# Patient Record
Sex: Female | Born: 1995
Health system: Southern US, Community
[De-identification: ages and names within clinical notes are randomized; demographics above are authoritative.]

## PROBLEM LIST (undated history)

## (undated) DIAGNOSIS — Z87898 Personal history of other specified conditions: Secondary | ICD-10-CM

## (undated) DIAGNOSIS — N939 Abnormal uterine and vaginal bleeding, unspecified: Secondary | ICD-10-CM

## (undated) DIAGNOSIS — T7840XA Allergy, unspecified, initial encounter: Secondary | ICD-10-CM

## (undated) DIAGNOSIS — R1313 Dysphagia, pharyngeal phase: Secondary | ICD-10-CM

## (undated) DIAGNOSIS — R790 Abnormal level of blood mineral: Secondary | ICD-10-CM

## (undated) DIAGNOSIS — R32 Unspecified urinary incontinence: Secondary | ICD-10-CM

## (undated) DIAGNOSIS — J45909 Unspecified asthma, uncomplicated: Secondary | ICD-10-CM

## (undated) DIAGNOSIS — I499 Cardiac arrhythmia, unspecified: Secondary | ICD-10-CM

## (undated) DIAGNOSIS — F909 Attention-deficit hyperactivity disorder, unspecified type: Secondary | ICD-10-CM

## (undated) DIAGNOSIS — M542 Cervicalgia: Secondary | ICD-10-CM

## (undated) DIAGNOSIS — M6289 Other specified disorders of muscle: Secondary | ICD-10-CM

## (undated) DIAGNOSIS — E559 Vitamin D deficiency, unspecified: Secondary | ICD-10-CM

## (undated) DIAGNOSIS — S73191A Other sprain of right hip, initial encounter: Secondary | ICD-10-CM

## (undated) DIAGNOSIS — J343 Hypertrophy of nasal turbinates: Secondary | ICD-10-CM

## (undated) DIAGNOSIS — K219 Gastro-esophageal reflux disease without esophagitis: Secondary | ICD-10-CM

## (undated) DIAGNOSIS — N809 Endometriosis, unspecified: Secondary | ICD-10-CM

## (undated) DIAGNOSIS — Q6589 Other specified congenital deformities of hip: Secondary | ICD-10-CM

## (undated) DIAGNOSIS — E7800 Pure hypercholesterolemia, unspecified: Secondary | ICD-10-CM

## (undated) DIAGNOSIS — G8929 Other chronic pain: Secondary | ICD-10-CM

## (undated) DIAGNOSIS — M25552 Pain in left hip: Secondary | ICD-10-CM

## (undated) DIAGNOSIS — J387 Other diseases of larynx: Secondary | ICD-10-CM

## (undated) DIAGNOSIS — M549 Dorsalgia, unspecified: Secondary | ICD-10-CM

## (undated) DIAGNOSIS — D649 Anemia, unspecified: Secondary | ICD-10-CM

## (undated) DIAGNOSIS — M791 Myalgia, unspecified site: Secondary | ICD-10-CM

## (undated) DIAGNOSIS — I7300 Raynaud's syndrome without gangrene: Secondary | ICD-10-CM

## (undated) DIAGNOSIS — S21001A Unspecified open wound of right breast, initial encounter: Secondary | ICD-10-CM

## (undated) DIAGNOSIS — M5416 Radiculopathy, lumbar region: Secondary | ICD-10-CM

## (undated) DIAGNOSIS — R09A2 Foreign body sensation, throat: Secondary | ICD-10-CM

## (undated) DIAGNOSIS — R1319 Other dysphagia: Secondary | ICD-10-CM

## (undated) DIAGNOSIS — R29898 Other symptoms and signs involving the musculoskeletal system: Secondary | ICD-10-CM

## (undated) DIAGNOSIS — J383 Other diseases of vocal cords: Secondary | ICD-10-CM

## (undated) DIAGNOSIS — R269 Unspecified abnormalities of gait and mobility: Secondary | ICD-10-CM

## (undated) DIAGNOSIS — F419 Anxiety disorder, unspecified: Secondary | ICD-10-CM

## (undated) DIAGNOSIS — N3941 Urge incontinence: Secondary | ICD-10-CM

## (undated) DIAGNOSIS — E162 Hypoglycemia, unspecified: Secondary | ICD-10-CM

## (undated) DIAGNOSIS — J342 Deviated nasal septum: Secondary | ICD-10-CM

## (undated) DIAGNOSIS — R278 Other lack of coordination: Secondary | ICD-10-CM

## (undated) DIAGNOSIS — J040 Acute laryngitis: Secondary | ICD-10-CM

## (undated) DIAGNOSIS — G2581 Restless legs syndrome: Secondary | ICD-10-CM

## (undated) DIAGNOSIS — R490 Dysphonia: Secondary | ICD-10-CM

## (undated) DIAGNOSIS — M2550 Pain in unspecified joint: Secondary | ICD-10-CM

## (undated) DIAGNOSIS — R634 Abnormal weight loss: Secondary | ICD-10-CM

## (undated) HISTORY — DX: Deviated nasal septum: J34.2

## (undated) HISTORY — DX: Restless legs syndrome: G25.81

## (undated) HISTORY — DX: Attention-deficit hyperactivity disorder, unspecified type: F90.9

## (undated) HISTORY — DX: Other specified disorders of muscle: M62.89

## (undated) HISTORY — DX: Urge incontinence: N39.41

## (undated) HISTORY — DX: Other lack of coordination: R27.8

## (undated) HISTORY — DX: Hypertrophy of nasal turbinates: J34.3

## (undated) HISTORY — DX: Other diseases of larynx: J38.7

## (undated) HISTORY — DX: Other specified congenital deformities of hip: Q65.89

## (undated) HISTORY — DX: Myalgia, unspecified site: M79.10

## (undated) HISTORY — PX: DENTAL SURGERY: SHX609

## (undated) HISTORY — DX: Dorsalgia, unspecified: M54.9

## (undated) HISTORY — DX: Hypoglycemia, unspecified: E16.2

## (undated) HISTORY — DX: Pain in unspecified joint: M25.50

## (undated) HISTORY — DX: Raynaud's syndrome without gangrene: I73.00

## (undated) HISTORY — DX: Unspecified abnormalities of gait and mobility: R26.9

## (undated) HISTORY — DX: Unspecified urinary incontinence: R32

## (undated) HISTORY — DX: Endometriosis, unspecified: N80.9

## (undated) HISTORY — DX: Abnormal weight loss: R63.4

## (undated) HISTORY — DX: Other sprain of right hip, initial encounter: S73.191A

## (undated) HISTORY — DX: Anemia, unspecified: D64.9

## (undated) HISTORY — DX: Dysphonia: R49.0

## (undated) HISTORY — DX: Dysphagia, pharyngeal phase: R13.13

## (undated) HISTORY — PX: HIP SURGERY: SHX245

## (undated) HISTORY — DX: Other dysphagia: R13.19

## (undated) HISTORY — DX: Gastro-esophageal reflux disease without esophagitis: K21.9

## (undated) HISTORY — DX: Cardiac arrhythmia, unspecified: I49.9

## (undated) HISTORY — DX: Acute laryngitis: J04.0

## (undated) HISTORY — DX: Abnormal level of blood mineral: R79.0

## (undated) HISTORY — DX: Allergy, unspecified, initial encounter: T78.40XA

## (undated) HISTORY — DX: Pure hypercholesterolemia, unspecified: E78.00

## (undated) HISTORY — DX: Other diseases of vocal cords: J38.3

## (undated) HISTORY — DX: Vitamin D deficiency, unspecified: E55.9

## (undated) HISTORY — DX: Unspecified open wound of right breast, initial encounter: S21.001A

## (undated) HISTORY — DX: Cervicalgia: M54.2

## (undated) HISTORY — DX: Personal history of other specified conditions: Z87.898

## (undated) HISTORY — DX: Abnormal uterine and vaginal bleeding, unspecified: N93.9

## (undated) HISTORY — DX: Foreign body sensation, throat: R09.A2

## (undated) HISTORY — DX: Anxiety disorder, unspecified: F41.9

## (undated) HISTORY — DX: Other symptoms and signs involving the musculoskeletal system: R29.898

## (undated) HISTORY — PX: LAPAROSCOPY: SHX197

## (undated) HISTORY — DX: Other chronic pain: G89.29

## (undated) HISTORY — PX: HIP ARTHROSCOPY: SUR88

## (undated) HISTORY — DX: Pain in left hip: M25.552

## (undated) HISTORY — DX: Radiculopathy, lumbar region: M54.16

---
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
## 2017-01-01 DIAGNOSIS — M25551 Pain in right hip: Secondary | ICD-10-CM | POA: Diagnosis not present

## 2017-01-01 DIAGNOSIS — S73191A Other sprain of right hip, initial encounter: Secondary | ICD-10-CM | POA: Diagnosis not present

## 2017-01-01 DIAGNOSIS — M21851 Other specified acquired deformities of right thigh: Secondary | ICD-10-CM | POA: Diagnosis not present

## 2017-01-01 DIAGNOSIS — M76891 Other specified enthesopathies of right lower limb, excluding foot: Secondary | ICD-10-CM | POA: Diagnosis not present

## 2017-01-01 DIAGNOSIS — Q6589 Other specified congenital deformities of hip: Secondary | ICD-10-CM | POA: Diagnosis not present

## 2017-02-07 MED FILL — SERTRALINE HCL 100 MG TAB: 100 | 30 days supply | Qty: 30 | Fill #0

## 2017-03-01 MED FILL — buPROPion HCL ER (XL) 150 M: 150 | 30 days supply | Qty: 30 | Fill #1

## 2017-03-14 MED FILL — SERTRALINE HCL 100 MG TAB: 100 | 30 days supply | Qty: 30 | Fill #1

## 2017-03-29 MED FILL — BUPROPION HCL XL 150 MG TAB: 150 | 30 days supply | Qty: 30 | Fill #2

## 2017-04-05 DIAGNOSIS — F329 Major depressive disorder, single episode, unspecified: Secondary | ICD-10-CM | POA: Diagnosis not present

## 2017-04-05 DIAGNOSIS — F411 Generalized anxiety disorder: Secondary | ICD-10-CM | POA: Diagnosis not present

## 2017-04-05 DIAGNOSIS — N926 Irregular menstruation, unspecified: Secondary | ICD-10-CM | POA: Diagnosis not present

## 2017-04-05 DIAGNOSIS — Z23 Encounter for immunization: Secondary | ICD-10-CM | POA: Diagnosis not present

## 2017-04-05 MED FILL — BUPROPION HCL XL 300 MG TAB: 300 | 30 days supply | Qty: 30 | Fill #0

## 2017-04-05 MED FILL — SERTRALINE HCL 50 MG TABLET: 50 | 30 days supply | Qty: 30 | Fill #0

## 2017-04-17 DIAGNOSIS — Z124 Encounter for screening for malignant neoplasm of cervix: Secondary | ICD-10-CM | POA: Diagnosis not present

## 2017-04-17 DIAGNOSIS — Z30430 Encounter for insertion of intrauterine contraceptive device: Secondary | ICD-10-CM | POA: Diagnosis not present

## 2017-04-17 DIAGNOSIS — Z309 Encounter for contraceptive management, unspecified: Secondary | ICD-10-CM | POA: Diagnosis not present

## 2017-04-17 DIAGNOSIS — N898 Other specified noninflammatory disorders of vagina: Secondary | ICD-10-CM | POA: Diagnosis not present

## 2017-04-26 DIAGNOSIS — F32A Depression, unspecified: Secondary | ICD-10-CM | POA: Insufficient documentation

## 2017-04-26 DIAGNOSIS — Z01818 Encounter for other preprocedural examination: Secondary | ICD-10-CM | POA: Diagnosis not present

## 2017-04-26 DIAGNOSIS — S73191A Other sprain of right hip, initial encounter: Secondary | ICD-10-CM | POA: Diagnosis not present

## 2017-04-26 DIAGNOSIS — Q6589 Other specified congenital deformities of hip: Secondary | ICD-10-CM | POA: Diagnosis not present

## 2017-04-26 DIAGNOSIS — F329 Major depressive disorder, single episode, unspecified: Secondary | ICD-10-CM | POA: Diagnosis not present

## 2017-04-26 DIAGNOSIS — S73191D Other sprain of right hip, subsequent encounter: Secondary | ICD-10-CM | POA: Diagnosis not present

## 2017-04-26 DIAGNOSIS — J309 Allergic rhinitis, unspecified: Secondary | ICD-10-CM | POA: Diagnosis not present

## 2017-04-29 DIAGNOSIS — M76891 Other specified enthesopathies of right lower limb, excluding foot: Secondary | ICD-10-CM | POA: Diagnosis not present

## 2017-05-03 DIAGNOSIS — Z23 Encounter for immunization: Secondary | ICD-10-CM | POA: Diagnosis not present

## 2017-05-03 DIAGNOSIS — Z113 Encounter for screening for infections with a predominantly sexual mode of transmission: Secondary | ICD-10-CM | POA: Diagnosis not present

## 2017-05-10 ENCOUNTER — Other Ambulatory Visit: Payer: Self-pay | Admitting: *Deleted

## 2017-05-10 NOTE — Patient Outreach (Addendum)
[facility]) Care Management  05/10/2017  Amanda Gill 01-09-1996 [id number]     Subjective: Telephone call to patient's home number, spoke with female answering the phone, states he is Amanda Gill's father, states Amanda Gill is not available is currently at school, father asked nature of call, advised father unable to discuss call nature without Beverlee's consent, father voiced understanding, left HIPAA compliant message for Amanda Gill, and requested call back.     Objective: Per KPN (Knowledge Performance Now, point of care tool) and chart review, [facility] for OSTEOTOMY, ILIAC, ACETABULAR OR INNOMINATE BONE.        Assessment: Received UMR  Preoperative / Transition of Care referral on 05/07/17.    Preoperative call follow up pending patient contact.        Plan: RNCM will call patient for  telephone outreach attempt, transition of care follow up, within 3 business days of hospital discharge notification.      Amanda Preble H. Gardiner Barefootooper RN, BSN, CCM [facility] Care [facility] Telephonic CM Phone: [phone] Fax: [phone]

## 2017-05-13 DIAGNOSIS — M25851 Other specified joint disorders, right hip: Secondary | ICD-10-CM | POA: Diagnosis not present

## 2017-05-13 DIAGNOSIS — G8918 Other acute postprocedural pain: Secondary | ICD-10-CM | POA: Diagnosis not present

## 2017-05-13 DIAGNOSIS — M25551 Pain in right hip: Secondary | ICD-10-CM | POA: Diagnosis not present

## 2017-05-13 DIAGNOSIS — M25852 Other specified joint disorders, left hip: Secondary | ICD-10-CM | POA: Diagnosis not present

## 2017-05-13 DIAGNOSIS — Q6589 Other specified congenital deformities of hip: Secondary | ICD-10-CM | POA: Diagnosis not present

## 2017-05-13 DIAGNOSIS — M24251 Disorder of ligament, right hip: Secondary | ICD-10-CM | POA: Diagnosis not present

## 2017-05-13 DIAGNOSIS — F329 Major depressive disorder, single episode, unspecified: Secondary | ICD-10-CM | POA: Diagnosis not present

## 2017-05-13 DIAGNOSIS — M24151 Other articular cartilage disorders, right hip: Secondary | ICD-10-CM | POA: Diagnosis not present

## 2017-05-13 DIAGNOSIS — S73191A Other sprain of right hip, initial encounter: Secondary | ICD-10-CM | POA: Diagnosis not present

## 2017-05-13 DIAGNOSIS — R42 Dizziness and giddiness: Secondary | ICD-10-CM | POA: Diagnosis not present

## 2017-05-14 MED ORDER — GABAPENTIN 300 MG PO CAPS
300.00 | ORAL_CAPSULE | ORAL | Status: DC
Start: 2017-05-17 — End: 2017-05-14

## 2017-05-14 MED ORDER — LACTATED RINGERS IV SOLN
INTRAVENOUS | Status: DC
Start: ? — End: 2017-05-14

## 2017-05-14 MED ORDER — SCOPOLAMINE 1 MG/3DAYS TD PT72
1.00 | MEDICATED_PATCH | TRANSDERMAL | Status: DC
Start: 2017-05-19 — End: 2017-05-14

## 2017-05-14 MED ORDER — SENNOSIDES-DOCUSATE SODIUM 8.6-50 MG PO TABS
1.00 | ORAL_TABLET | ORAL | Status: DC
Start: 2017-05-17 — End: 2017-05-14

## 2017-05-14 MED ORDER — DIPHENHYDRAMINE HCL 25 MG PO CAPS
25.00 | ORAL_CAPSULE | ORAL | Status: DC
Start: ? — End: 2017-05-14

## 2017-05-14 MED ORDER — MORPHINE SULFATE 2 MG/ML IJ SOLN
2.00 | INTRAMUSCULAR | Status: DC
Start: ? — End: 2017-05-14

## 2017-05-14 MED ORDER — CAMPHOR-MENTHOL 0.5-0.5 % EX LOTN
TOPICAL_LOTION | CUTANEOUS | Status: DC
Start: ? — End: 2017-05-14

## 2017-05-14 MED ORDER — SERTRALINE HCL 100 MG PO TABS
100.00 | ORAL_TABLET | ORAL | Status: DC
Start: 2017-05-17 — End: 2017-05-14

## 2017-05-14 MED ORDER — SODIUM CHLORIDE 0.9 % IJ SOLN
5.00 | INTRAMUSCULAR | Status: DC
Start: ? — End: 2017-05-14

## 2017-05-14 MED ORDER — BUPROPION HCL ER (XL) 300 MG PO TB24
300.00 | ORAL_TABLET | ORAL | Status: DC
Start: 2017-05-18 — End: 2017-05-14

## 2017-05-14 MED ORDER — GENERIC EXTERNAL MEDICATION
Status: DC
Start: ? — End: 2017-05-14

## 2017-05-14 MED ORDER — ACETAMINOPHEN 500 MG PO TABS
1000.00 | ORAL_TABLET | ORAL | Status: DC
Start: 2017-05-17 — End: 2017-05-14

## 2017-05-14 MED ORDER — OXYCODONE HCL 5 MG PO TABS
5.00 | ORAL_TABLET | ORAL | Status: DC
Start: ? — End: 2017-05-14

## 2017-05-14 MED ORDER — ASPIRIN EC 325 MG PO TBEC
325.00 | DELAYED_RELEASE_TABLET | ORAL | Status: DC
Start: 2017-05-17 — End: 2017-05-14

## 2017-05-14 MED ORDER — CYCLOBENZAPRINE HCL 10 MG PO TABS
10.00 | ORAL_TABLET | ORAL | Status: DC
Start: ? — End: 2017-05-14

## 2017-05-14 MED ORDER — LIDOCAINE HCL (PF) 1 % IJ SOLN
0.50 | INTRAMUSCULAR | Status: DC
Start: ? — End: 2017-05-14

## 2017-05-14 MED ORDER — POLYETHYLENE GLYCOL 3350 17 G PO PACK
17.00 | PACK | ORAL | Status: DC
Start: 2017-05-18 — End: 2017-05-14

## 2017-05-14 MED ORDER — BISACODYL 10 MG RE SUPP
10.00 | RECTAL | Status: DC
Start: ? — End: 2017-05-14

## 2017-05-14 MED ORDER — MORPHINE SULFATE 2 MG/ML IJ SOLN
4.00 | INTRAMUSCULAR | Status: DC
Start: ? — End: 2017-05-14

## 2017-05-14 MED ORDER — KETOROLAC TROMETHAMINE 30 MG/ML IJ SOLN
30.00 | INTRAMUSCULAR | Status: DC
Start: 2017-05-14 — End: 2017-05-14

## 2017-05-15 MED FILL — MELOXICAM 15 MG TABLET: 15 | 30 days supply | Qty: 30 | Fill #0

## 2017-05-15 MED FILL — ONDANSETRON ODT 4 MG TABLET: 4 | 10 days supply | Qty: 30 | Fill #0

## 2017-05-15 MED FILL — CYCLOBENZAPRINE 10 MG TAB: 10 | 7 days supply | Qty: 21 | Fill #0

## 2017-05-15 MED FILL — GABAPENTIN 300 MG CAPSULE: 300 | 14 days supply | Qty: 42 | Fill #0

## 2017-05-15 MED FILL — ASPIRIN EC 325 MG TABLET: 325 | 30 days supply | Qty: 60 | Fill #0

## 2017-05-16 MED ORDER — KETOROLAC TROMETHAMINE 15 MG/ML IJ SOLN
15.00 | INTRAMUSCULAR | Status: DC
Start: ? — End: 2017-05-16

## 2017-05-16 MED ORDER — MELOXICAM 15 MG PO TABS
15.00 | ORAL_TABLET | ORAL | Status: DC
Start: 2017-05-18 — End: 2017-05-16

## 2017-05-16 MED ORDER — GENERIC EXTERNAL MEDICATION
Status: DC
Start: ? — End: 2017-05-16

## 2017-05-17 MED ORDER — GENERIC EXTERNAL MEDICATION
Status: DC
Start: ? — End: 2017-05-17

## 2017-05-17 MED FILL — oxyCODONE HCL 5 MG TABS: 5 | 10 days supply | Qty: 60 | Fill #0

## 2017-05-18 DIAGNOSIS — Q6589 Other specified congenital deformities of hip: Secondary | ICD-10-CM | POA: Diagnosis not present

## 2017-05-18 DIAGNOSIS — F329 Major depressive disorder, single episode, unspecified: Secondary | ICD-10-CM | POA: Diagnosis not present

## 2017-05-18 DIAGNOSIS — Z791 Long term (current) use of non-steroidal anti-inflammatories (NSAID): Secondary | ICD-10-CM | POA: Diagnosis not present

## 2017-05-18 DIAGNOSIS — Z79891 Long term (current) use of opiate analgesic: Secondary | ICD-10-CM | POA: Diagnosis not present

## 2017-05-18 DIAGNOSIS — Z4789 Encounter for other orthopedic aftercare: Secondary | ICD-10-CM | POA: Diagnosis not present

## 2017-05-18 DIAGNOSIS — Z7982 Long term (current) use of aspirin: Secondary | ICD-10-CM | POA: Diagnosis not present

## 2017-05-20 ENCOUNTER — Other Ambulatory Visit: Payer: Self-pay | Admitting: *Deleted

## 2017-05-20 ENCOUNTER — Encounter: Payer: Self-pay | Admitting: *Deleted

## 2017-05-20 MED FILL — BUPROPION HCL XL 300 MG TAB: 300 | 30 days supply | Qty: 30 | Fill #1

## 2017-05-20 MED FILL — SERTRALINE HCL 50 MG TABLET: 50 | 30 days supply | Qty: 30 | Fill #1

## 2017-05-20 NOTE — Patient Outreach (Signed)
[facility]) Care Management  05/20/2017  Amanda LookMelanie Gill 03/11/1996 [id number]   Subjective: Telephone call to patient's home number, spoke with patient, and HIPAA verified.  [facility]ed [facility] UMR Transition of care follow up, patient voiced understanding, and is in agreement to follow up.  Patient states she is doing well, is receiving home health services, has a follow up appointment with surgeon on 05/30/17. Patient states she is able to manage self care and has assistance as needed. Patient voices understanding of medical diagnosis,surgery, and treatment plan.  Patient gave verbal consent to speak with mother Amanda Gill(Amanda Gill) as needed regarding healthcare needs.  Spoke with patient's mother Amanda Gill(Amanda), discussed [facility] UMR Transition of care follow up, mother voiced understanding, and is in agreement to follow up.   Medications and benefits reviewed with mother.  Mother states patient and she are accessing the following [facility] benefits: outpatient pharmacy, hospital indemnity (not chosen), and mother has family medical leave act Engineer, maintenance (IT)(FMLA) in place.   Patient states she does not have any education material, transition of care, care coordination, disease management, disease monitoring, transportation, community resource, or pharmacy needs at this time.  Mother and patient states they are very appreciative of the follow up and are in agreement to receive [facility] information.     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    Status post OSTEOTOMY, ILIAC, ACETABULAR OR INNOMINATE BONE on   05/13/17.         Assessment: Received UMR  Preoperative / Transition of Care referral on 05/07/17.    Preoperative call follow up not completed due to patient unable to contact.  Transition of care follow up  completed, no care management needs, and will proceed with case closure.       Plan: RNCM will send patient successful outreach letter, [facility] pamph[facility], and magnet. RNCM will send case closure due to follow up completed / no care management needs request to Amanda AlaminLaura Gill at [facility].     Amanda Gill H. Amanda Barefootooper RN, BSN, CCM [facility] Care Management [facility] Telephonic CM Phone: [phone] Fax: [phone]

## 2017-05-22 DIAGNOSIS — Q6589 Other specified congenital deformities of hip: Secondary | ICD-10-CM | POA: Diagnosis not present

## 2017-05-22 DIAGNOSIS — Z791 Long term (current) use of non-steroidal anti-inflammatories (NSAID): Secondary | ICD-10-CM | POA: Diagnosis not present

## 2017-05-22 DIAGNOSIS — Z79891 Long term (current) use of opiate analgesic: Secondary | ICD-10-CM | POA: Diagnosis not present

## 2017-05-22 DIAGNOSIS — Z7982 Long term (current) use of aspirin: Secondary | ICD-10-CM | POA: Diagnosis not present

## 2017-05-22 DIAGNOSIS — Z4789 Encounter for other orthopedic aftercare: Secondary | ICD-10-CM | POA: Diagnosis not present

## 2017-05-22 DIAGNOSIS — F329 Major depressive disorder, single episode, unspecified: Secondary | ICD-10-CM | POA: Diagnosis not present

## 2017-05-23 ENCOUNTER — Encounter: Payer: Self-pay | Admitting: *Deleted

## 2017-05-23 NOTE — Telephone Encounter (Signed)
This encounter was created in error - please disregard.

## 2017-05-24 DIAGNOSIS — Z791 Long term (current) use of non-steroidal anti-inflammatories (NSAID): Secondary | ICD-10-CM | POA: Diagnosis not present

## 2017-05-24 DIAGNOSIS — Z7982 Long term (current) use of aspirin: Secondary | ICD-10-CM | POA: Diagnosis not present

## 2017-05-24 DIAGNOSIS — Z79891 Long term (current) use of opiate analgesic: Secondary | ICD-10-CM | POA: Diagnosis not present

## 2017-05-24 DIAGNOSIS — F329 Major depressive disorder, single episode, unspecified: Secondary | ICD-10-CM | POA: Diagnosis not present

## 2017-05-24 DIAGNOSIS — Q6589 Other specified congenital deformities of hip: Secondary | ICD-10-CM | POA: Diagnosis not present

## 2017-05-24 DIAGNOSIS — Z4789 Encounter for other orthopedic aftercare: Secondary | ICD-10-CM | POA: Diagnosis not present

## 2017-05-29 DIAGNOSIS — Z791 Long term (current) use of non-steroidal anti-inflammatories (NSAID): Secondary | ICD-10-CM | POA: Diagnosis not present

## 2017-05-29 DIAGNOSIS — Z7982 Long term (current) use of aspirin: Secondary | ICD-10-CM | POA: Diagnosis not present

## 2017-05-29 DIAGNOSIS — F329 Major depressive disorder, single episode, unspecified: Secondary | ICD-10-CM | POA: Diagnosis not present

## 2017-05-29 DIAGNOSIS — Z79891 Long term (current) use of opiate analgesic: Secondary | ICD-10-CM | POA: Diagnosis not present

## 2017-05-29 DIAGNOSIS — Z4789 Encounter for other orthopedic aftercare: Secondary | ICD-10-CM | POA: Diagnosis not present

## 2017-05-29 DIAGNOSIS — Q6589 Other specified congenital deformities of hip: Secondary | ICD-10-CM | POA: Diagnosis not present

## 2017-05-30 DIAGNOSIS — R102 Pelvic and perineal pain: Secondary | ICD-10-CM | POA: Diagnosis not present

## 2017-05-30 DIAGNOSIS — Z4682 Encounter for fitting and adjustment of non-vascular catheter: Secondary | ICD-10-CM | POA: Diagnosis not present

## 2017-05-30 MED FILL — GABAPENTIN 300 MG CAPS: 300 | 30 days supply | Qty: 90 | Fill #0

## 2017-05-31 DIAGNOSIS — Q6589 Other specified congenital deformities of hip: Secondary | ICD-10-CM | POA: Diagnosis not present

## 2017-05-31 DIAGNOSIS — Z791 Long term (current) use of non-steroidal anti-inflammatories (NSAID): Secondary | ICD-10-CM | POA: Diagnosis not present

## 2017-05-31 DIAGNOSIS — Z4789 Encounter for other orthopedic aftercare: Secondary | ICD-10-CM | POA: Diagnosis not present

## 2017-05-31 DIAGNOSIS — Z7982 Long term (current) use of aspirin: Secondary | ICD-10-CM | POA: Diagnosis not present

## 2017-05-31 DIAGNOSIS — Z79891 Long term (current) use of opiate analgesic: Secondary | ICD-10-CM | POA: Diagnosis not present

## 2017-05-31 DIAGNOSIS — F329 Major depressive disorder, single episode, unspecified: Secondary | ICD-10-CM | POA: Diagnosis not present

## 2017-06-12 MED FILL — MELOXICAM 15 MG TABLET: 15 | 30 days supply | Qty: 30 | Fill #1

## 2017-06-17 MED FILL — BUPROPION HCL XL 300 MG TAB: 300 | 30 days supply | Qty: 30 | Fill #2

## 2017-06-26 MED FILL — SERTRALINE HCL 50 MG TABLET: 50 | 30 days supply | Qty: 30 | Fill #2

## 2017-06-27 DIAGNOSIS — Z975 Presence of (intrauterine) contraceptive device: Secondary | ICD-10-CM | POA: Diagnosis not present

## 2017-06-27 DIAGNOSIS — M76891 Other specified enthesopathies of right lower limb, excluding foot: Secondary | ICD-10-CM | POA: Diagnosis not present

## 2017-07-03 DIAGNOSIS — M25551 Pain in right hip: Secondary | ICD-10-CM | POA: Diagnosis not present

## 2017-07-08 DIAGNOSIS — M25551 Pain in right hip: Secondary | ICD-10-CM | POA: Diagnosis not present

## 2017-07-15 DIAGNOSIS — M25551 Pain in right hip: Secondary | ICD-10-CM | POA: Diagnosis not present

## 2017-07-19 MED FILL — BUPROPION HCL XL 300 MG TAB: 300 | 30 days supply | Qty: 30 | Fill #3

## 2017-07-22 DIAGNOSIS — M25551 Pain in right hip: Secondary | ICD-10-CM | POA: Diagnosis not present

## 2017-07-29 DIAGNOSIS — M25551 Pain in right hip: Secondary | ICD-10-CM | POA: Diagnosis not present

## 2017-08-01 DIAGNOSIS — R102 Pelvic and perineal pain: Secondary | ICD-10-CM | POA: Diagnosis not present

## 2017-08-07 DIAGNOSIS — M25551 Pain in right hip: Secondary | ICD-10-CM | POA: Diagnosis not present

## 2017-08-09 DIAGNOSIS — F329 Major depressive disorder, single episode, unspecified: Secondary | ICD-10-CM | POA: Diagnosis not present

## 2017-08-09 DIAGNOSIS — F411 Generalized anxiety disorder: Secondary | ICD-10-CM | POA: Diagnosis not present

## 2017-08-12 DIAGNOSIS — M25551 Pain in right hip: Secondary | ICD-10-CM | POA: Diagnosis not present

## 2017-08-16 DIAGNOSIS — M25551 Pain in right hip: Secondary | ICD-10-CM | POA: Diagnosis not present

## 2017-08-19 DIAGNOSIS — M25551 Pain in right hip: Secondary | ICD-10-CM | POA: Diagnosis not present

## 2017-08-21 DIAGNOSIS — M25551 Pain in right hip: Secondary | ICD-10-CM | POA: Diagnosis not present

## 2017-08-22 DIAGNOSIS — R300 Dysuria: Secondary | ICD-10-CM | POA: Diagnosis not present

## 2017-08-22 DIAGNOSIS — Z113 Encounter for screening for infections with a predominantly sexual mode of transmission: Secondary | ICD-10-CM | POA: Diagnosis not present

## 2017-08-26 DIAGNOSIS — M25551 Pain in right hip: Secondary | ICD-10-CM | POA: Diagnosis not present

## 2017-08-28 DIAGNOSIS — M25551 Pain in right hip: Secondary | ICD-10-CM | POA: Diagnosis not present

## 2017-09-02 DIAGNOSIS — M25551 Pain in right hip: Secondary | ICD-10-CM | POA: Diagnosis not present

## 2017-09-04 DIAGNOSIS — M25551 Pain in right hip: Secondary | ICD-10-CM | POA: Diagnosis not present

## 2017-09-09 DIAGNOSIS — M25551 Pain in right hip: Secondary | ICD-10-CM | POA: Diagnosis not present

## 2017-09-11 DIAGNOSIS — M25551 Pain in right hip: Secondary | ICD-10-CM | POA: Diagnosis not present

## 2017-09-16 DIAGNOSIS — M25551 Pain in right hip: Secondary | ICD-10-CM | POA: Diagnosis not present

## 2017-09-18 DIAGNOSIS — M25551 Pain in right hip: Secondary | ICD-10-CM | POA: Diagnosis not present

## 2017-09-24 DIAGNOSIS — M25552 Pain in left hip: Secondary | ICD-10-CM | POA: Diagnosis not present

## 2017-10-01 DIAGNOSIS — M6281 Muscle weakness (generalized): Secondary | ICD-10-CM | POA: Diagnosis not present

## 2017-10-01 DIAGNOSIS — M25552 Pain in left hip: Secondary | ICD-10-CM | POA: Diagnosis not present

## 2017-10-01 DIAGNOSIS — M79651 Pain in right thigh: Secondary | ICD-10-CM | POA: Diagnosis not present

## 2017-10-04 DIAGNOSIS — M79651 Pain in right thigh: Secondary | ICD-10-CM | POA: Diagnosis not present

## 2017-10-04 DIAGNOSIS — M25552 Pain in left hip: Secondary | ICD-10-CM | POA: Diagnosis not present

## 2017-10-04 DIAGNOSIS — M6281 Muscle weakness (generalized): Secondary | ICD-10-CM | POA: Diagnosis not present

## 2017-10-15 DIAGNOSIS — M79651 Pain in right thigh: Secondary | ICD-10-CM | POA: Diagnosis not present

## 2017-10-15 DIAGNOSIS — M6281 Muscle weakness (generalized): Secondary | ICD-10-CM | POA: Diagnosis not present

## 2017-10-15 DIAGNOSIS — M25552 Pain in left hip: Secondary | ICD-10-CM | POA: Diagnosis not present

## 2017-10-18 DIAGNOSIS — M25552 Pain in left hip: Secondary | ICD-10-CM | POA: Diagnosis not present

## 2017-10-18 DIAGNOSIS — M6281 Muscle weakness (generalized): Secondary | ICD-10-CM | POA: Diagnosis not present

## 2017-10-18 DIAGNOSIS — M79651 Pain in right thigh: Secondary | ICD-10-CM | POA: Diagnosis not present

## 2017-10-22 DIAGNOSIS — M79651 Pain in right thigh: Secondary | ICD-10-CM | POA: Diagnosis not present

## 2017-10-22 DIAGNOSIS — M25552 Pain in left hip: Secondary | ICD-10-CM | POA: Diagnosis not present

## 2017-10-22 DIAGNOSIS — M6281 Muscle weakness (generalized): Secondary | ICD-10-CM | POA: Diagnosis not present

## 2017-10-24 DIAGNOSIS — M79651 Pain in right thigh: Secondary | ICD-10-CM | POA: Diagnosis not present

## 2017-10-24 DIAGNOSIS — M6281 Muscle weakness (generalized): Secondary | ICD-10-CM | POA: Diagnosis not present

## 2017-10-24 DIAGNOSIS — M25552 Pain in left hip: Secondary | ICD-10-CM | POA: Diagnosis not present

## 2017-10-25 DIAGNOSIS — F411 Generalized anxiety disorder: Secondary | ICD-10-CM | POA: Diagnosis not present

## 2017-10-25 DIAGNOSIS — F329 Major depressive disorder, single episode, unspecified: Secondary | ICD-10-CM | POA: Diagnosis not present

## 2017-10-29 DIAGNOSIS — M6281 Muscle weakness (generalized): Secondary | ICD-10-CM | POA: Diagnosis not present

## 2017-10-29 DIAGNOSIS — M79651 Pain in right thigh: Secondary | ICD-10-CM | POA: Diagnosis not present

## 2017-10-29 DIAGNOSIS — M25552 Pain in left hip: Secondary | ICD-10-CM | POA: Diagnosis not present

## 2017-10-31 DIAGNOSIS — M25552 Pain in left hip: Secondary | ICD-10-CM | POA: Diagnosis not present

## 2017-10-31 DIAGNOSIS — R102 Pelvic and perineal pain: Secondary | ICD-10-CM | POA: Diagnosis not present

## 2017-10-31 DIAGNOSIS — Q6589 Other specified congenital deformities of hip: Secondary | ICD-10-CM | POA: Diagnosis not present

## 2017-10-31 MED FILL — MELOXICAM 15 MG TABLET: 15 | 30 days supply | Qty: 30 | Fill #0

## 2017-11-04 DIAGNOSIS — M25552 Pain in left hip: Secondary | ICD-10-CM | POA: Diagnosis not present

## 2017-11-04 DIAGNOSIS — Q6589 Other specified congenital deformities of hip: Secondary | ICD-10-CM | POA: Diagnosis not present

## 2017-11-04 DIAGNOSIS — M6281 Muscle weakness (generalized): Secondary | ICD-10-CM | POA: Diagnosis not present

## 2017-11-05 DIAGNOSIS — N939 Abnormal uterine and vaginal bleeding, unspecified: Secondary | ICD-10-CM | POA: Diagnosis not present

## 2017-11-05 DIAGNOSIS — Z30431 Encounter for routine checking of intrauterine contraceptive device: Secondary | ICD-10-CM | POA: Diagnosis not present

## 2017-11-06 ENCOUNTER — Other Ambulatory Visit: Payer: Self-pay | Admitting: Physician Assistant

## 2017-11-06 DIAGNOSIS — Z30431 Encounter for routine checking of intrauterine contraceptive device: Secondary | ICD-10-CM | POA: Diagnosis not present

## 2017-11-06 DIAGNOSIS — M25552 Pain in left hip: Secondary | ICD-10-CM | POA: Diagnosis not present

## 2017-11-06 DIAGNOSIS — N939 Abnormal uterine and vaginal bleeding, unspecified: Secondary | ICD-10-CM

## 2017-11-06 DIAGNOSIS — Q6589 Other specified congenital deformities of hip: Secondary | ICD-10-CM | POA: Diagnosis not present

## 2017-11-06 DIAGNOSIS — M6281 Muscle weakness (generalized): Secondary | ICD-10-CM | POA: Diagnosis not present

## 2017-11-11 DIAGNOSIS — M25552 Pain in left hip: Secondary | ICD-10-CM | POA: Diagnosis not present

## 2017-11-11 DIAGNOSIS — M6281 Muscle weakness (generalized): Secondary | ICD-10-CM | POA: Diagnosis not present

## 2017-11-11 DIAGNOSIS — Q6589 Other specified congenital deformities of hip: Secondary | ICD-10-CM | POA: Diagnosis not present

## 2017-11-13 DIAGNOSIS — Q6589 Other specified congenital deformities of hip: Secondary | ICD-10-CM | POA: Diagnosis not present

## 2017-11-13 DIAGNOSIS — M25552 Pain in left hip: Secondary | ICD-10-CM | POA: Diagnosis not present

## 2017-11-13 DIAGNOSIS — M6281 Muscle weakness (generalized): Secondary | ICD-10-CM | POA: Diagnosis not present

## 2017-11-13 DIAGNOSIS — Z30431 Encounter for routine checking of intrauterine contraceptive device: Secondary | ICD-10-CM | POA: Diagnosis not present

## 2017-11-14 MED FILL — buPROPion HCL ER (XL) 300 M: 300 | 90 days supply | Qty: 90 | Fill #0

## 2017-11-14 MED FILL — SERTRALINE HCL 100 MG TAB: 100 | 90 days supply | Qty: 90 | Fill #0

## 2017-11-18 ENCOUNTER — Ambulatory Visit
Admission: RE | Admit: 2017-11-18 | Discharge: 2017-11-18 | Disposition: A | Discharge status: Home / Self Care | Payer: 59 | Source: Ambulatory Visit | Attending: Physician Assistant | Admitting: Physician Assistant

## 2017-11-18 DIAGNOSIS — N939 Abnormal uterine and vaginal bleeding, unspecified: Secondary | ICD-10-CM

## 2017-11-19 DIAGNOSIS — Z30431 Encounter for routine checking of intrauterine contraceptive device: Secondary | ICD-10-CM | POA: Diagnosis not present

## 2017-11-19 DIAGNOSIS — M6281 Muscle weakness (generalized): Secondary | ICD-10-CM | POA: Diagnosis not present

## 2017-11-19 DIAGNOSIS — M25552 Pain in left hip: Secondary | ICD-10-CM | POA: Diagnosis not present

## 2017-11-19 DIAGNOSIS — Q6589 Other specified congenital deformities of hip: Secondary | ICD-10-CM | POA: Diagnosis not present

## 2017-11-22 DIAGNOSIS — Q6589 Other specified congenital deformities of hip: Secondary | ICD-10-CM | POA: Diagnosis not present

## 2017-11-22 DIAGNOSIS — M25552 Pain in left hip: Secondary | ICD-10-CM | POA: Diagnosis not present

## 2017-11-22 DIAGNOSIS — M6281 Muscle weakness (generalized): Secondary | ICD-10-CM | POA: Diagnosis not present

## 2017-11-29 MED FILL — MELOXICAM 15 MG TABLET: 15 | 30 days supply | Qty: 30 | Fill #1

## 2017-12-02 DIAGNOSIS — M545 Low back pain: Secondary | ICD-10-CM | POA: Diagnosis not present

## 2017-12-02 DIAGNOSIS — M9905 Segmental and somatic dysfunction of pelvic region: Secondary | ICD-10-CM | POA: Diagnosis not present

## 2017-12-02 DIAGNOSIS — M9914 Subluxation complex (vertebral) of sacral region: Secondary | ICD-10-CM | POA: Diagnosis not present

## 2017-12-02 DIAGNOSIS — M9903 Segmental and somatic dysfunction of lumbar region: Secondary | ICD-10-CM | POA: Diagnosis not present

## 2017-12-03 DIAGNOSIS — F419 Anxiety disorder, unspecified: Secondary | ICD-10-CM | POA: Diagnosis not present

## 2017-12-03 DIAGNOSIS — F4010 Social phobia, unspecified: Secondary | ICD-10-CM | POA: Diagnosis not present

## 2017-12-03 DIAGNOSIS — F319 Bipolar disorder, unspecified: Secondary | ICD-10-CM | POA: Diagnosis not present

## 2017-12-04 DIAGNOSIS — M9903 Segmental and somatic dysfunction of lumbar region: Secondary | ICD-10-CM | POA: Diagnosis not present

## 2017-12-04 DIAGNOSIS — M9914 Subluxation complex (vertebral) of sacral region: Secondary | ICD-10-CM | POA: Diagnosis not present

## 2017-12-04 DIAGNOSIS — M545 Low back pain: Secondary | ICD-10-CM | POA: Diagnosis not present

## 2017-12-04 DIAGNOSIS — M9905 Segmental and somatic dysfunction of pelvic region: Secondary | ICD-10-CM | POA: Diagnosis not present

## 2017-12-04 MED FILL — lamoTRIgine 25 MG TABS: 25 | 34 days supply | Qty: 60 | Fill #0

## 2017-12-05 DIAGNOSIS — M545 Low back pain: Secondary | ICD-10-CM | POA: Diagnosis not present

## 2017-12-05 DIAGNOSIS — M9903 Segmental and somatic dysfunction of lumbar region: Secondary | ICD-10-CM | POA: Diagnosis not present

## 2017-12-05 DIAGNOSIS — M9914 Subluxation complex (vertebral) of sacral region: Secondary | ICD-10-CM | POA: Diagnosis not present

## 2017-12-05 DIAGNOSIS — M9905 Segmental and somatic dysfunction of pelvic region: Secondary | ICD-10-CM | POA: Diagnosis not present

## 2017-12-06 DIAGNOSIS — F419 Anxiety disorder, unspecified: Secondary | ICD-10-CM | POA: Insufficient documentation

## 2017-12-06 DIAGNOSIS — Z30432 Encounter for removal of intrauterine contraceptive device: Secondary | ICD-10-CM | POA: Diagnosis not present

## 2017-12-06 DIAGNOSIS — I499 Cardiac arrhythmia, unspecified: Secondary | ICD-10-CM | POA: Insufficient documentation

## 2017-12-06 DIAGNOSIS — Z309 Encounter for contraceptive management, unspecified: Secondary | ICD-10-CM | POA: Diagnosis not present

## 2017-12-06 DIAGNOSIS — R000 Tachycardia, unspecified: Secondary | ICD-10-CM | POA: Insufficient documentation

## 2017-12-06 DIAGNOSIS — R6889 Other general symptoms and signs: Secondary | ICD-10-CM | POA: Diagnosis not present

## 2017-12-09 DIAGNOSIS — M9903 Segmental and somatic dysfunction of lumbar region: Secondary | ICD-10-CM | POA: Diagnosis not present

## 2017-12-09 DIAGNOSIS — M9914 Subluxation complex (vertebral) of sacral region: Secondary | ICD-10-CM | POA: Diagnosis not present

## 2017-12-09 DIAGNOSIS — M9905 Segmental and somatic dysfunction of pelvic region: Secondary | ICD-10-CM | POA: Diagnosis not present

## 2017-12-09 DIAGNOSIS — M545 Low back pain: Secondary | ICD-10-CM | POA: Diagnosis not present

## 2017-12-11 DIAGNOSIS — M9905 Segmental and somatic dysfunction of pelvic region: Secondary | ICD-10-CM | POA: Diagnosis not present

## 2017-12-11 DIAGNOSIS — M9903 Segmental and somatic dysfunction of lumbar region: Secondary | ICD-10-CM | POA: Diagnosis not present

## 2017-12-11 DIAGNOSIS — M545 Low back pain: Secondary | ICD-10-CM | POA: Diagnosis not present

## 2017-12-11 DIAGNOSIS — M9914 Subluxation complex (vertebral) of sacral region: Secondary | ICD-10-CM | POA: Diagnosis not present

## 2017-12-12 DIAGNOSIS — M545 Low back pain: Secondary | ICD-10-CM | POA: Diagnosis not present

## 2017-12-12 DIAGNOSIS — M9905 Segmental and somatic dysfunction of pelvic region: Secondary | ICD-10-CM | POA: Diagnosis not present

## 2017-12-12 DIAGNOSIS — M9903 Segmental and somatic dysfunction of lumbar region: Secondary | ICD-10-CM | POA: Diagnosis not present

## 2017-12-12 DIAGNOSIS — M9914 Subluxation complex (vertebral) of sacral region: Secondary | ICD-10-CM | POA: Diagnosis not present

## 2017-12-16 DIAGNOSIS — M545 Low back pain: Secondary | ICD-10-CM | POA: Diagnosis not present

## 2017-12-16 DIAGNOSIS — M9905 Segmental and somatic dysfunction of pelvic region: Secondary | ICD-10-CM | POA: Diagnosis not present

## 2017-12-16 DIAGNOSIS — M9914 Subluxation complex (vertebral) of sacral region: Secondary | ICD-10-CM | POA: Diagnosis not present

## 2017-12-16 DIAGNOSIS — M9903 Segmental and somatic dysfunction of lumbar region: Secondary | ICD-10-CM | POA: Diagnosis not present

## 2017-12-17 DIAGNOSIS — F319 Bipolar disorder, unspecified: Secondary | ICD-10-CM | POA: Diagnosis not present

## 2017-12-17 DIAGNOSIS — F419 Anxiety disorder, unspecified: Secondary | ICD-10-CM | POA: Diagnosis not present

## 2017-12-17 DIAGNOSIS — F4010 Social phobia, unspecified: Secondary | ICD-10-CM | POA: Diagnosis not present

## 2017-12-17 DIAGNOSIS — M76892 Other specified enthesopathies of left lower limb, excluding foot: Secondary | ICD-10-CM | POA: Diagnosis not present

## 2017-12-18 DIAGNOSIS — M9905 Segmental and somatic dysfunction of pelvic region: Secondary | ICD-10-CM | POA: Diagnosis not present

## 2017-12-18 DIAGNOSIS — M545 Low back pain: Secondary | ICD-10-CM | POA: Diagnosis not present

## 2017-12-18 DIAGNOSIS — M9903 Segmental and somatic dysfunction of lumbar region: Secondary | ICD-10-CM | POA: Diagnosis not present

## 2017-12-18 DIAGNOSIS — M9914 Subluxation complex (vertebral) of sacral region: Secondary | ICD-10-CM | POA: Diagnosis not present

## 2017-12-24 DIAGNOSIS — M9905 Segmental and somatic dysfunction of pelvic region: Secondary | ICD-10-CM | POA: Diagnosis not present

## 2017-12-24 DIAGNOSIS — M545 Low back pain: Secondary | ICD-10-CM | POA: Diagnosis not present

## 2017-12-24 DIAGNOSIS — M9903 Segmental and somatic dysfunction of lumbar region: Secondary | ICD-10-CM | POA: Diagnosis not present

## 2017-12-24 DIAGNOSIS — M9914 Subluxation complex (vertebral) of sacral region: Secondary | ICD-10-CM | POA: Diagnosis not present

## 2017-12-25 DIAGNOSIS — Z3202 Encounter for pregnancy test, result negative: Secondary | ICD-10-CM | POA: Diagnosis not present

## 2017-12-25 DIAGNOSIS — Z30431 Encounter for routine checking of intrauterine contraceptive device: Secondary | ICD-10-CM | POA: Diagnosis not present

## 2017-12-25 DIAGNOSIS — Z30430 Encounter for insertion of intrauterine contraceptive device: Secondary | ICD-10-CM | POA: Diagnosis not present

## 2017-12-26 DIAGNOSIS — M545 Low back pain: Secondary | ICD-10-CM | POA: Diagnosis not present

## 2017-12-26 DIAGNOSIS — M9903 Segmental and somatic dysfunction of lumbar region: Secondary | ICD-10-CM | POA: Diagnosis not present

## 2017-12-26 DIAGNOSIS — M9914 Subluxation complex (vertebral) of sacral region: Secondary | ICD-10-CM | POA: Diagnosis not present

## 2017-12-26 DIAGNOSIS — M9905 Segmental and somatic dysfunction of pelvic region: Secondary | ICD-10-CM | POA: Diagnosis not present

## 2017-12-30 DIAGNOSIS — M9905 Segmental and somatic dysfunction of pelvic region: Secondary | ICD-10-CM | POA: Diagnosis not present

## 2017-12-30 DIAGNOSIS — M9903 Segmental and somatic dysfunction of lumbar region: Secondary | ICD-10-CM | POA: Diagnosis not present

## 2017-12-30 DIAGNOSIS — M545 Low back pain: Secondary | ICD-10-CM | POA: Diagnosis not present

## 2017-12-30 DIAGNOSIS — M9914 Subluxation complex (vertebral) of sacral region: Secondary | ICD-10-CM | POA: Diagnosis not present

## 2018-01-01 DIAGNOSIS — M9905 Segmental and somatic dysfunction of pelvic region: Secondary | ICD-10-CM | POA: Diagnosis not present

## 2018-01-01 DIAGNOSIS — M9903 Segmental and somatic dysfunction of lumbar region: Secondary | ICD-10-CM | POA: Diagnosis not present

## 2018-01-01 DIAGNOSIS — M9914 Subluxation complex (vertebral) of sacral region: Secondary | ICD-10-CM | POA: Diagnosis not present

## 2018-01-01 DIAGNOSIS — M545 Low back pain: Secondary | ICD-10-CM | POA: Diagnosis not present

## 2018-01-03 DIAGNOSIS — M9905 Segmental and somatic dysfunction of pelvic region: Secondary | ICD-10-CM | POA: Diagnosis not present

## 2018-01-03 DIAGNOSIS — M545 Low back pain: Secondary | ICD-10-CM | POA: Diagnosis not present

## 2018-01-03 DIAGNOSIS — M9903 Segmental and somatic dysfunction of lumbar region: Secondary | ICD-10-CM | POA: Diagnosis not present

## 2018-01-03 DIAGNOSIS — M9914 Subluxation complex (vertebral) of sacral region: Secondary | ICD-10-CM | POA: Diagnosis not present

## 2018-01-03 MED FILL — lamoTRIgine 25 MG TABS: 25 | 30 days supply | Qty: 90 | Fill #0

## 2018-01-06 DIAGNOSIS — F4010 Social phobia, unspecified: Secondary | ICD-10-CM | POA: Diagnosis not present

## 2018-01-06 DIAGNOSIS — F319 Bipolar disorder, unspecified: Secondary | ICD-10-CM | POA: Diagnosis not present

## 2018-01-06 DIAGNOSIS — F419 Anxiety disorder, unspecified: Secondary | ICD-10-CM | POA: Diagnosis not present

## 2018-01-31 DIAGNOSIS — Z30431 Encounter for routine checking of intrauterine contraceptive device: Secondary | ICD-10-CM | POA: Diagnosis not present

## 2018-02-03 DIAGNOSIS — F4010 Social phobia, unspecified: Secondary | ICD-10-CM | POA: Diagnosis not present

## 2018-02-03 DIAGNOSIS — F319 Bipolar disorder, unspecified: Secondary | ICD-10-CM | POA: Diagnosis not present

## 2018-02-03 DIAGNOSIS — F419 Anxiety disorder, unspecified: Secondary | ICD-10-CM | POA: Diagnosis not present

## 2018-02-13 DIAGNOSIS — M955 Acquired deformity of pelvis: Secondary | ICD-10-CM | POA: Diagnosis not present

## 2018-02-13 MED FILL — buPROPion HCL ER (XL) 300 M: 300 | 90 days supply | Qty: 90 | Fill #1

## 2018-02-13 MED FILL — SERTRALINE HCL 100 MG TAB: 100 | 90 days supply | Qty: 90 | Fill #1

## 2018-03-05 DIAGNOSIS — F4010 Social phobia, unspecified: Secondary | ICD-10-CM | POA: Diagnosis not present

## 2018-03-05 DIAGNOSIS — F319 Bipolar disorder, unspecified: Secondary | ICD-10-CM | POA: Diagnosis not present

## 2018-03-05 DIAGNOSIS — F419 Anxiety disorder, unspecified: Secondary | ICD-10-CM | POA: Diagnosis not present

## 2018-03-06 DIAGNOSIS — Q6589 Other specified congenital deformities of hip: Secondary | ICD-10-CM | POA: Diagnosis not present

## 2018-03-25 DIAGNOSIS — F319 Bipolar disorder, unspecified: Secondary | ICD-10-CM | POA: Diagnosis not present

## 2018-03-25 DIAGNOSIS — F419 Anxiety disorder, unspecified: Secondary | ICD-10-CM | POA: Diagnosis not present

## 2018-03-25 DIAGNOSIS — F4010 Social phobia, unspecified: Secondary | ICD-10-CM | POA: Diagnosis not present

## 2018-03-26 DIAGNOSIS — R000 Tachycardia, unspecified: Secondary | ICD-10-CM | POA: Diagnosis not present

## 2018-04-10 DIAGNOSIS — F319 Bipolar disorder, unspecified: Secondary | ICD-10-CM | POA: Diagnosis not present

## 2018-04-10 DIAGNOSIS — F419 Anxiety disorder, unspecified: Secondary | ICD-10-CM | POA: Diagnosis not present

## 2018-04-10 DIAGNOSIS — F4010 Social phobia, unspecified: Secondary | ICD-10-CM | POA: Diagnosis not present

## 2018-04-17 DIAGNOSIS — F319 Bipolar disorder, unspecified: Secondary | ICD-10-CM | POA: Diagnosis not present

## 2018-04-17 DIAGNOSIS — F419 Anxiety disorder, unspecified: Secondary | ICD-10-CM | POA: Diagnosis not present

## 2018-04-17 DIAGNOSIS — F4010 Social phobia, unspecified: Secondary | ICD-10-CM | POA: Diagnosis not present

## 2018-04-18 DIAGNOSIS — Z23 Encounter for immunization: Secondary | ICD-10-CM | POA: Diagnosis not present

## 2018-05-01 DIAGNOSIS — F319 Bipolar disorder, unspecified: Secondary | ICD-10-CM | POA: Diagnosis not present

## 2018-05-01 DIAGNOSIS — F419 Anxiety disorder, unspecified: Secondary | ICD-10-CM | POA: Diagnosis not present

## 2018-05-01 DIAGNOSIS — F4010 Social phobia, unspecified: Secondary | ICD-10-CM | POA: Diagnosis not present

## 2018-05-01 DIAGNOSIS — Z7689 Persons encountering health services in other specified circumstances: Secondary | ICD-10-CM | POA: Diagnosis not present

## 2018-05-14 DIAGNOSIS — F419 Anxiety disorder, unspecified: Secondary | ICD-10-CM | POA: Diagnosis not present

## 2018-05-14 DIAGNOSIS — F319 Bipolar disorder, unspecified: Secondary | ICD-10-CM | POA: Diagnosis not present

## 2018-05-14 DIAGNOSIS — F4010 Social phobia, unspecified: Secondary | ICD-10-CM | POA: Diagnosis not present

## 2018-05-20 DIAGNOSIS — F319 Bipolar disorder, unspecified: Secondary | ICD-10-CM | POA: Insufficient documentation

## 2018-05-20 DIAGNOSIS — R000 Tachycardia, unspecified: Secondary | ICD-10-CM | POA: Diagnosis not present

## 2018-05-20 DIAGNOSIS — T6591XA Toxic effect of unspecified substance, accidental (unintentional), initial encounter: Secondary | ICD-10-CM | POA: Insufficient documentation

## 2018-05-20 DIAGNOSIS — J310 Chronic rhinitis: Secondary | ICD-10-CM | POA: Diagnosis not present

## 2018-05-28 IMAGING — MR MR HIP*R* W/CM
4 of 5 series · 21 of 40 positions shown · IV contrast (agent unspecified)
Comparison: Radiographs dated 07/13/2016

CLINICAL DATA: Chronic right hip pain for 8 months.

EXAM:
MRI OF THE RIGHT HIP WITH CONTRAST(MR Arthrogram)
TECHNIQUE: Multiplanar, multisequence MR imaging of the hip was performed
immediately following contrast injection into the hip joint under
fluoroscopic guidance. No intravenous contrast was administered.

[Series 2: T1 · coronal · 4.0mm · 0.70mm/px · 6 of 18 slices shown (1 of 4)]
[im 1/18]
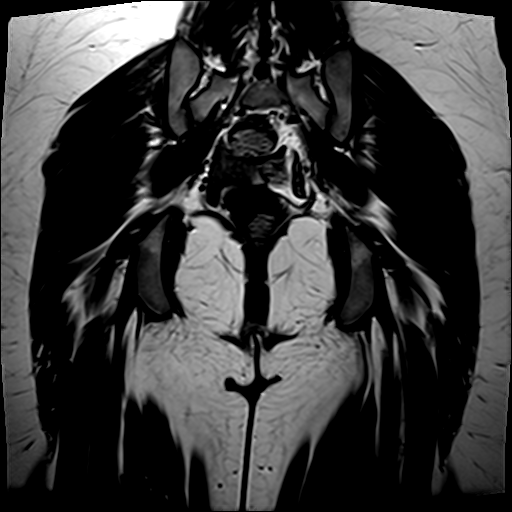
[im 4/18]
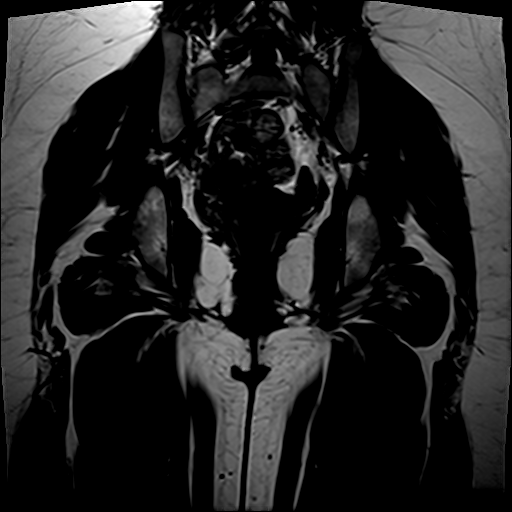
[im 7/18]
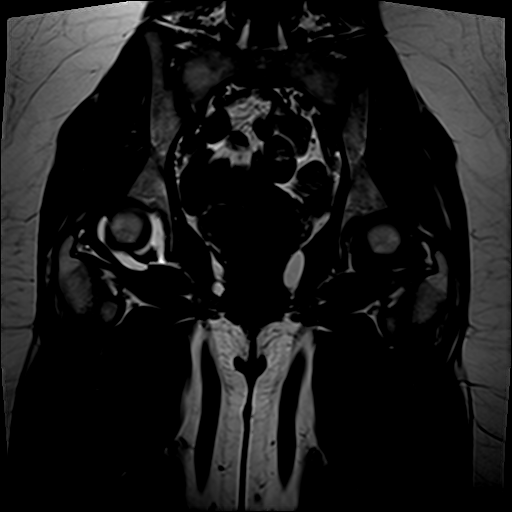
[im 11/18]
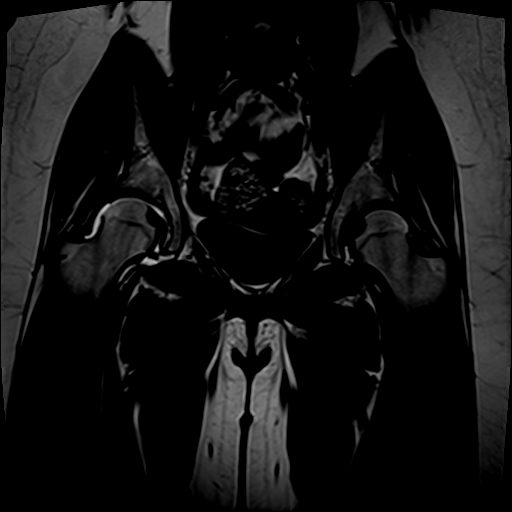
[im 14/18]
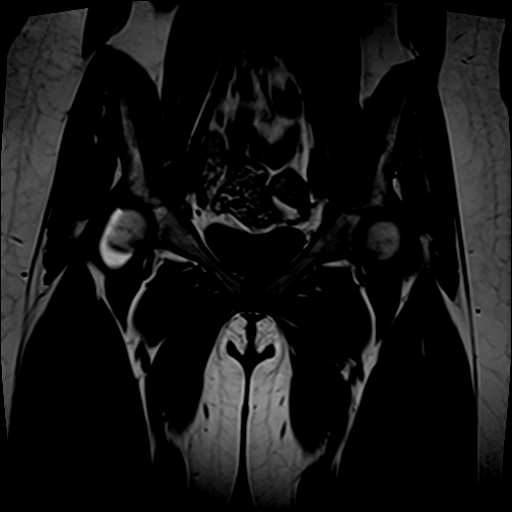
[im 18/18]
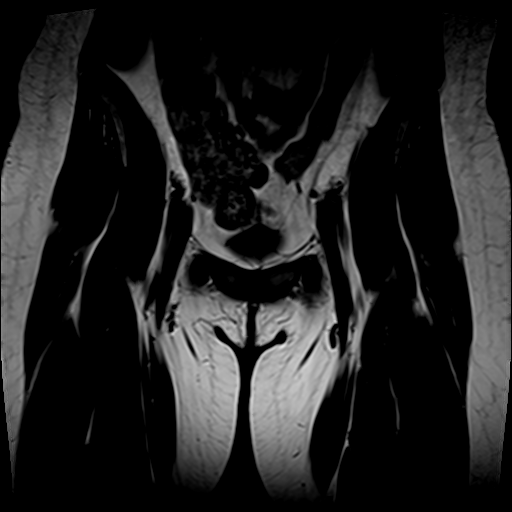

[Series 4: T1 · coronal · 4.0mm · 0.35mm/px · 7 of 20 slices shown (2 of 4)]
[im 1/20]
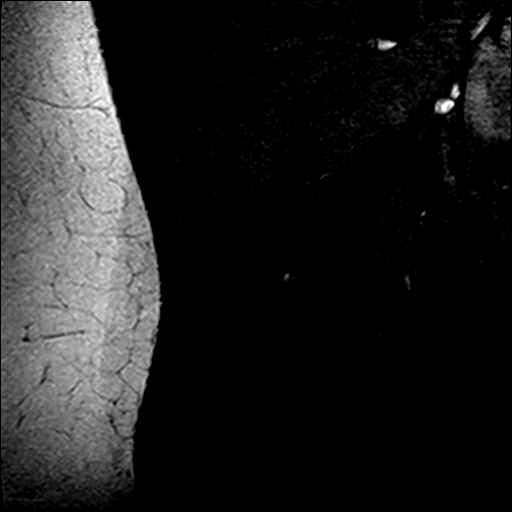
[im 4/20]
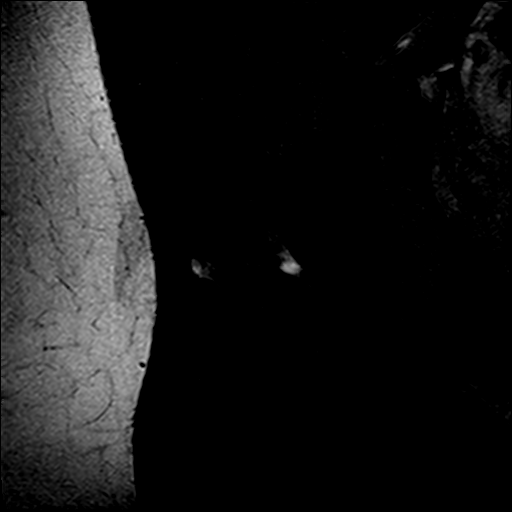
[im 7/20]
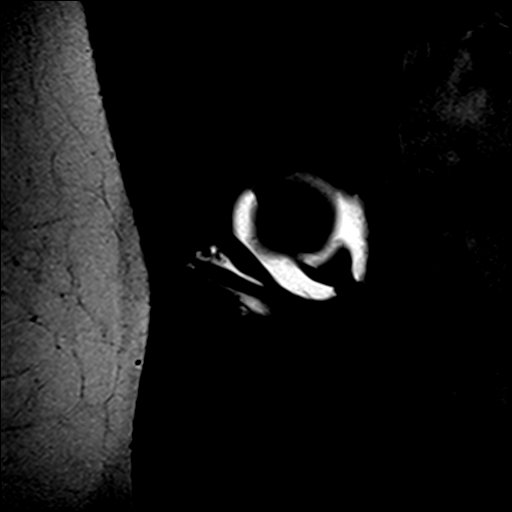
[im 10/20]
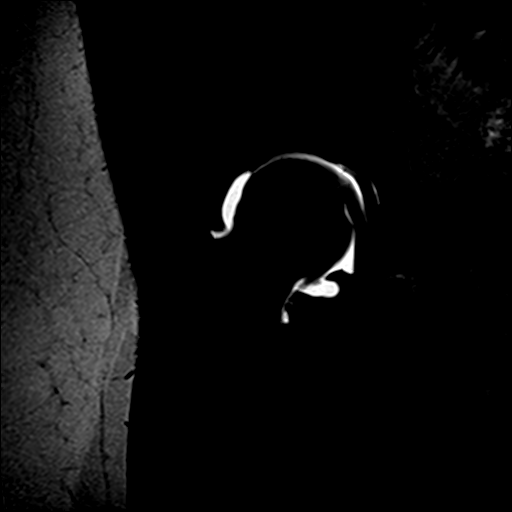
[im 13/20]
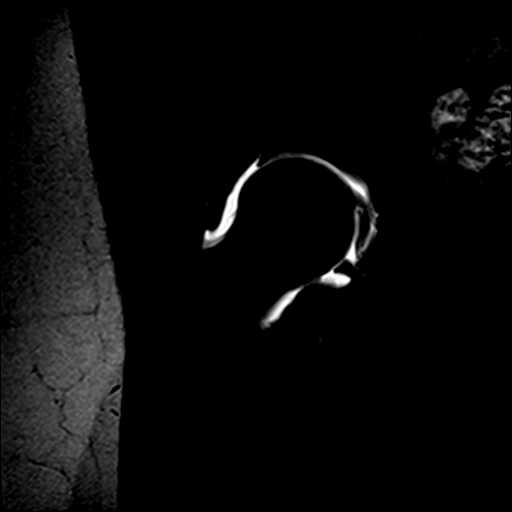
[im 16/20]
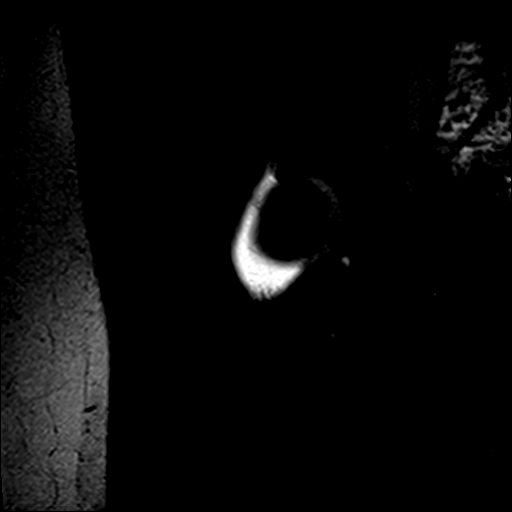
[im 20/20]
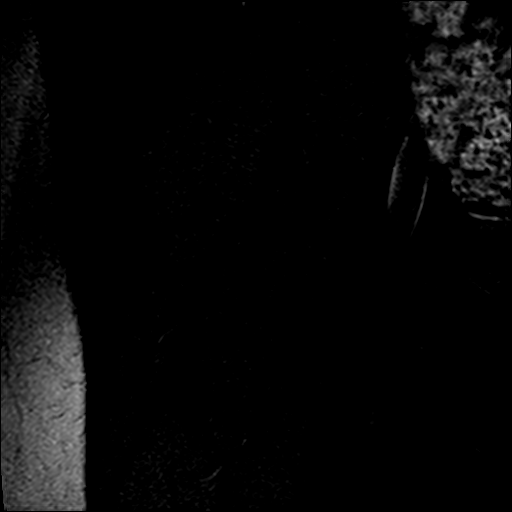

[Series 5: T1 · axial · 4.0mm · 0.35mm/px · z∈[-47,+33]mm · 5 of 24 slices shown (3 of 4)]
[im 1/24]
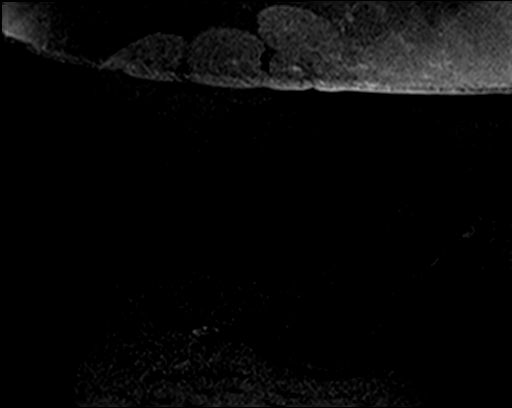
[im 3/24]
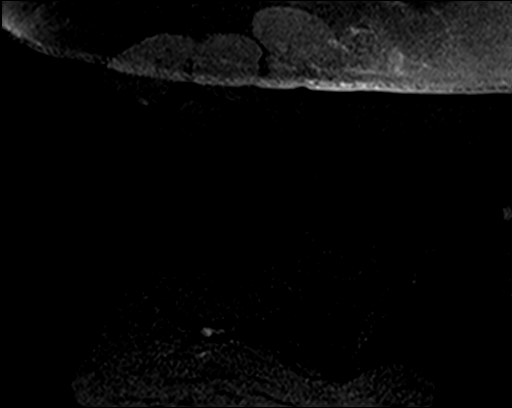
[im 6/24]
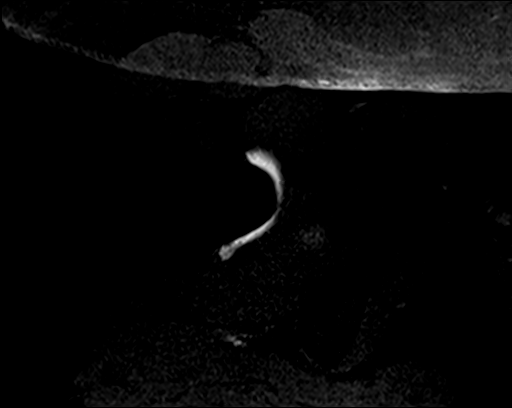
[im 12/24]
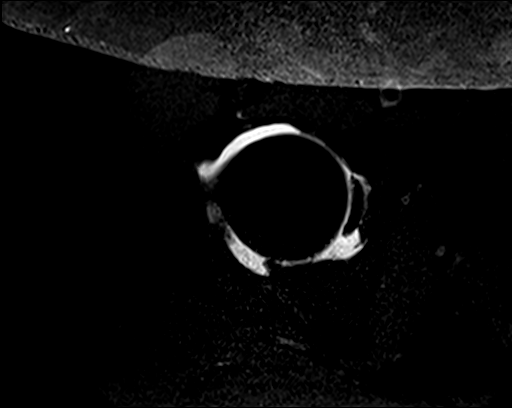
[im 21/24]
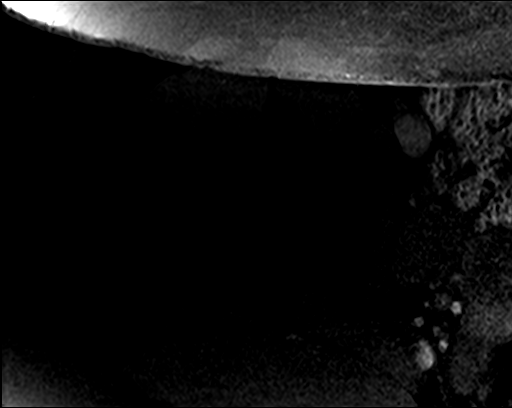

[Series 6: T1 · sagittal · 4.0mm · 0.35mm/px · 3 of 30 slices shown (4 of 4)]
[im 3/30]
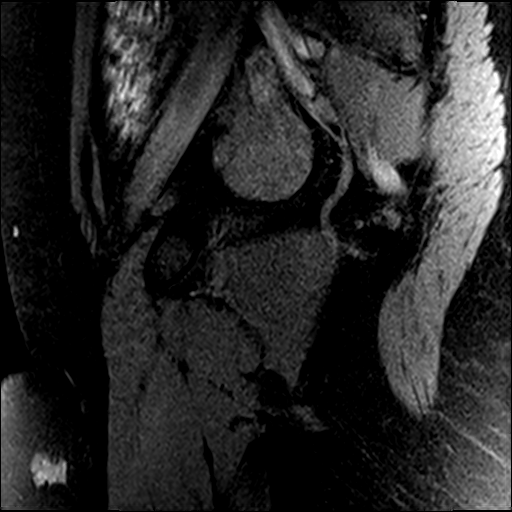
[im 15/30]
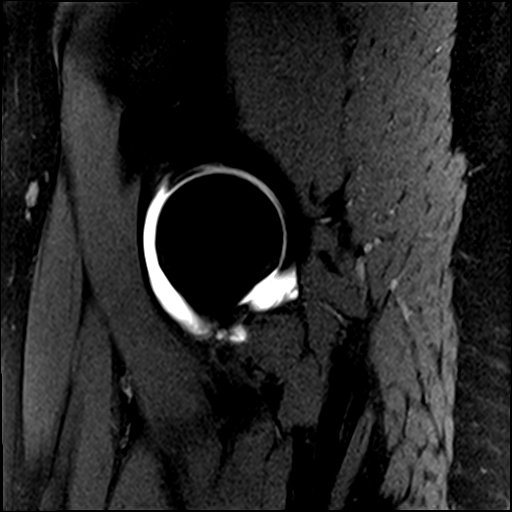
[im 27/30]
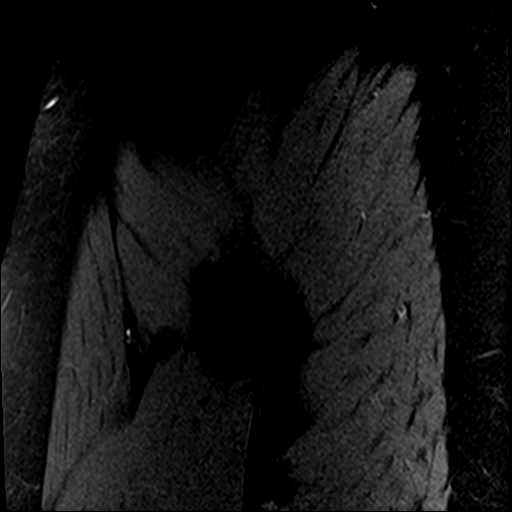

[21 of 40 positions shown; findings below may reference images not displayed]

FINDINGS: Labrum: Normal.

Bones:  Normal.

Bursae:  Normal.

Adjacent soft tissues: Normal. No adenopathy or mass lesions. No
evidence of athletic pubalgia.
IMPRESSION: Normal MR arthrogram of the right hip.

## 2018-05-29 DIAGNOSIS — F419 Anxiety disorder, unspecified: Secondary | ICD-10-CM | POA: Diagnosis not present

## 2018-05-29 DIAGNOSIS — F319 Bipolar disorder, unspecified: Secondary | ICD-10-CM | POA: Diagnosis not present

## 2018-05-29 DIAGNOSIS — F4010 Social phobia, unspecified: Secondary | ICD-10-CM | POA: Diagnosis not present

## 2018-06-16 DIAGNOSIS — F39 Unspecified mood [affective] disorder: Secondary | ICD-10-CM | POA: Diagnosis not present

## 2018-06-25 DIAGNOSIS — F319 Bipolar disorder, unspecified: Secondary | ICD-10-CM | POA: Diagnosis not present

## 2018-06-25 DIAGNOSIS — F4010 Social phobia, unspecified: Secondary | ICD-10-CM | POA: Diagnosis not present

## 2018-06-25 DIAGNOSIS — F419 Anxiety disorder, unspecified: Secondary | ICD-10-CM | POA: Diagnosis not present

## 2018-06-25 MED FILL — LITHIUM ER 450 MG TABLET: 450 | 30 days supply | Qty: 60 | Fill #0

## 2018-06-25 MED FILL — VENLAFAXINE HCL ER 75 MG CA: 75 | 30 days supply | Qty: 30 | Fill #0

## 2018-07-15 DIAGNOSIS — Q6589 Other specified congenital deformities of hip: Secondary | ICD-10-CM | POA: Diagnosis not present

## 2018-07-15 DIAGNOSIS — M25552 Pain in left hip: Secondary | ICD-10-CM | POA: Diagnosis not present

## 2018-07-16 DIAGNOSIS — F4010 Social phobia, unspecified: Secondary | ICD-10-CM | POA: Diagnosis not present

## 2018-07-16 DIAGNOSIS — F319 Bipolar disorder, unspecified: Secondary | ICD-10-CM | POA: Diagnosis not present

## 2018-07-16 DIAGNOSIS — F419 Anxiety disorder, unspecified: Secondary | ICD-10-CM | POA: Diagnosis not present

## 2018-07-16 MED FILL — LITHIUM CARBONATE ER 300 MG: 300 | 30 days supply | Qty: 30 | Fill #0

## 2018-07-16 MED FILL — SHIPPING COST: 1 days supply | Qty: 1 | Fill #0

## 2018-07-19 MED FILL — LITHIUM ER 450 MG TABLET: 450 | 30 days supply | Qty: 60 | Fill #0

## 2018-07-22 DIAGNOSIS — J029 Acute pharyngitis, unspecified: Secondary | ICD-10-CM | POA: Diagnosis not present

## 2018-07-22 DIAGNOSIS — J040 Acute laryngitis: Secondary | ICD-10-CM | POA: Diagnosis not present

## 2018-07-22 DIAGNOSIS — R509 Fever, unspecified: Secondary | ICD-10-CM | POA: Diagnosis not present

## 2018-07-22 DIAGNOSIS — J0100 Acute maxillary sinusitis, unspecified: Secondary | ICD-10-CM | POA: Diagnosis not present

## 2018-07-23 DIAGNOSIS — J029 Acute pharyngitis, unspecified: Secondary | ICD-10-CM | POA: Insufficient documentation

## 2018-07-23 DIAGNOSIS — J040 Acute laryngitis: Secondary | ICD-10-CM | POA: Insufficient documentation

## 2018-07-23 DIAGNOSIS — R509 Fever, unspecified: Secondary | ICD-10-CM | POA: Insufficient documentation

## 2018-07-23 DIAGNOSIS — J0100 Acute maxillary sinusitis, unspecified: Secondary | ICD-10-CM | POA: Insufficient documentation

## 2018-07-23 MED FILL — VENLAFAXINE HCL ER 75 MG CA: 75 | 30 days supply | Qty: 30 | Fill #1

## 2018-07-23 MED FILL — SHIPPING COST: 1 days supply | Qty: 1 | Fill #1

## 2018-08-13 DIAGNOSIS — F4010 Social phobia, unspecified: Secondary | ICD-10-CM | POA: Diagnosis not present

## 2018-08-13 DIAGNOSIS — F319 Bipolar disorder, unspecified: Secondary | ICD-10-CM | POA: Diagnosis not present

## 2018-08-13 DIAGNOSIS — F419 Anxiety disorder, unspecified: Secondary | ICD-10-CM | POA: Diagnosis not present

## 2018-08-19 MED FILL — LITHIUM CARBONATE ER 300 MG: 300 | 30 days supply | Qty: 30 | Fill #1

## 2018-08-19 MED FILL — LITHIUM ER 450 MG TABLET: 450 | 15 days supply | Qty: 30 | Fill #1

## 2018-08-25 MED FILL — VENLAFAXINE HCL ER 75 MG CA: 75 | 30 days supply | Qty: 30 | Fill #2

## 2018-09-08 MED FILL — LITHIUM ER 450 MG TABLET: 450 | 15 days supply | Qty: 30 | Fill #2

## 2018-09-08 MED FILL — LITHIUM CARBONATE ER 300 MG: 300 | 30 days supply | Qty: 30 | Fill #2

## 2018-09-22 MED FILL — LITHIUM ER 450 MG TABLET: 450 | 15 days supply | Qty: 30 | Fill #3

## 2018-09-25 DIAGNOSIS — Q6589 Other specified congenital deformities of hip: Secondary | ICD-10-CM | POA: Diagnosis not present

## 2018-09-26 MED FILL — VENLAFAXINE HCL ER 75 MG CA: 75 | 30 days supply | Qty: 30 | Fill #0

## 2018-10-15 DIAGNOSIS — F419 Anxiety disorder, unspecified: Secondary | ICD-10-CM | POA: Diagnosis not present

## 2018-10-15 DIAGNOSIS — F319 Bipolar disorder, unspecified: Secondary | ICD-10-CM | POA: Diagnosis not present

## 2018-10-15 DIAGNOSIS — F4010 Social phobia, unspecified: Secondary | ICD-10-CM | POA: Diagnosis not present

## 2018-10-15 MED FILL — LITHIUM ER 450 MG TABLET: 450 | 15 days supply | Qty: 30 | Fill #4

## 2018-10-21 DIAGNOSIS — Q6589 Other specified congenital deformities of hip: Secondary | ICD-10-CM | POA: Diagnosis not present

## 2018-10-21 MED FILL — METHYLPREDNISOLONE 4 MG TBP: 4 | 6 days supply | Qty: 21 | Fill #0

## 2018-10-21 MED FILL — LITHIUM CARBONATE 300 MG CA: 300 | 30 days supply | Qty: 120 | Fill #0

## 2018-10-21 MED FILL — VENLAFAXINE HCL ER 75 MG CA: 75 | 30 days supply | Qty: 30 | Fill #0

## 2018-11-03 DIAGNOSIS — H5213 Myopia, bilateral: Secondary | ICD-10-CM | POA: Diagnosis not present

## 2018-11-03 DIAGNOSIS — Z135 Encounter for screening for eye and ear disorders: Secondary | ICD-10-CM | POA: Diagnosis not present

## 2018-11-07 ENCOUNTER — Ambulatory Visit (INDEPENDENT_AMBULATORY_CARE_PROVIDER_SITE_OTHER): Payer: 59 | Admitting: Podiatry

## 2018-11-07 ENCOUNTER — Other Ambulatory Visit: Payer: Self-pay

## 2018-11-07 ENCOUNTER — Encounter: Payer: Self-pay | Admitting: Podiatry

## 2018-11-07 VITALS — BP 104/71 | HR 89 | Temp 98.0°F

## 2018-11-07 DIAGNOSIS — B351 Tinea unguium: Secondary | ICD-10-CM

## 2018-11-07 DIAGNOSIS — L600 Ingrowing nail: Secondary | ICD-10-CM | POA: Diagnosis not present

## 2018-11-07 DIAGNOSIS — L608 Other nail disorders: Secondary | ICD-10-CM | POA: Diagnosis not present

## 2018-11-07 MED ORDER — CEPHALEXIN 500 MG PO CAPS: 500.0000 mg | ORAL_CAPSULE | Freq: Three times a day (TID) | ORAL | 0 refills | Status: DC

## 2018-11-07 NOTE — Patient Instructions (Signed)

## 2018-11-07 NOTE — Progress Notes (Signed)
Subjective:   Patient ID: Amanda Gill, female   DOB: 23 y.o.   MRN: [id number]   HPI 23 year old female presents the office with concerns of chronic ingrown toenails to both of her big toes.  She is had the left medial corner removed previously but she states that it came back and she wants to have this removed because it causes pain.  The other nail corners on the left lateral as well as both of the medial and lateral in the right side of becoming painful with pressure in shoes and she was to have these removed today.  Denies any drainage or pus.   Review of Systems  All other systems reviewed and are negative.  History reviewed. No pertinent past medical history.  Past Surgical History:  Procedure Laterality Date  . DENTAL SURGERY       Current Outpatient Medications:  .  azelastine (ASTELIN) 0.1 % nasal spray, 2 sprays by Each Nare route two (2) times a day as needed (congestion)., Disp: , Rfl:  .  busPIRone (BUSPAR) 15 MG tablet, TAKE 1 TABLET UPTO 3 TIMES A DAY.  MAX 60 MG DAILY., Disp: , Rfl:  .  Levonorgestrel (KYLEENA) 19.5 MG IUD, Kyleena 17.5 mcg/24 hrs (41yrs) 19.5mg  intrauterine device  Take 1 device by intrauterine route., Disp: , Rfl:  .  lithium carbonate (ESKALITH) 450 MG CR tablet, 900 mg, Disp: , Rfl:  .  venlafaxine XR (EFFEXOR-XR) 75 MG 24 hr capsule, 150 mg, Disp: , Rfl:  .  buPROPion (WELLBUTRIN XL) 300 MG 24 hr tablet, bupropion HCl XL 300 mg 24 hr tablet, extended release, Disp: , Rfl:  .  cefdinir (OMNICEF) 300 MG capsule, TAKE 1 CAPSULE (300 MG TOTAL) BY MOUTH EVERY 12 HOURS, Disp: , Rfl:  .  cephALEXin (KEFLEX) 500 MG capsule, Take 1 capsule (500 mg total) by mouth 3 (three) times daily., Disp: 21 capsule, Rfl: 0 .  EPINEPHrine 0.3 mg/0.3 mL IJ SOAJ injection, Inject into the muscle., Disp: , Rfl:  .  fluticasone (FLONASE) 50 MCG/ACT nasal spray, 1 spray per nostril once daily.  May increase to twice daily for 1 week when symptoms are worse., Disp: , Rfl:  .   methylPREDNISolone (MEDROL DOSEPAK) 4 MG TBPK tablet, , Disp: , Rfl:  .  naproxen (NAPROSYN) 500 MG tablet, naproxen 500 mg tablet, Disp: , Rfl:  .  omeprazole (PRILOSEC) 20 MG capsule, Take 1 capsule (20 mg total) by mouth daily. (Patient not taking: Reported on 05/20/2017), Disp: 15 capsule, Rfl: 1 .  sertraline (ZOLOFT) 100 MG tablet, sertraline 100 mg tablet, Disp: , Rfl:   Allergies  Allergen Reactions  . Chlorhexidine Other (See Comments), Itching, Rash and Shortness Of Breath    With shower prep night before surgery With shower prep night before surgery With shower prep night before surgery   . Lac Bovis Other (See Comments)         Objective:  Physical Exam  General: AAO x3, NAD  Dermatological: Incurvation present to both medial lateral aspect of the right hallux toenail, lateral aspect of the left hallux toenail spicule of nail is present along the medial aspect of the left hallux.  Tenderness palpation of the nail corners there is localized edema but there is no erythema or warmth.  No ascending cellulitis.  No drainage or pus.  Vascular: Dorsalis Pedis artery and Posterior Tibial artery pedal pulses are 2/4 bilateral with immedate capillary fill time.  There is no pain with calf compression, swelling,  warmth, erythema.   Neruologic: Grossly intact via light touch bilateral.   Musculoskeletal: No gross boney pedal deformities bilateral. No pain, crepitus, or limitation noted with foot and ankle range of motion bilateral. Muscular strength 5/5 in all groups tested bilateral.  Gait: Unassisted, Nonantalgic.       Assessment:   Chronic ingrown toenails bilaterally    Plan:  -Treatment options discussed including all alternatives, risks, and complications -Etiology of symptoms were discussed -At this time, the patient is requesting partial nail removal with chemical matricectomy to the symptomatic portion of the nail. Risks and complications were discussed with the  patient for which they understand and written consent was obtained. Under sterile conditions a total of 3 mL of a mixture of 2% lidocaine plain and 0.5% Marcaine plain was infiltrated in a hallux block fashion. Once anesthetized, the skin was prepped in sterile fashion. A tourniquet was then applied. Next the medial and lateral aspect of hallux nail border was then sharply excised making sure to remove the entire offending nail border. Once the nails were ensured to be removed area was debrided and the underlying skin was intact. There is no purulence identified in the procedure. Next phenol was then applied under standard conditions and copiously irrigated. Silvadene was applied. A dry sterile dressing was applied. After application of the dressing the tourniquet was removed and there is found to be an immediate capillary refill time to the digit. The patient tolerated the procedure well any complications. Post procedure instructions were discussed the patient for which he verbally understood. Follow-up in one week for nail check or sooner if any problems are to arise. Discussed signs/symptoms of infection and directed to call the office immediately should any occur or go directly to the emergency room. In the meantime, encouraged to call the office with any questions, concerns, changes symptoms. -Keflex -nail sent for culture the nails are thick and discolored although mildly.  Vivi BarrackMatthew R Vanity Larsson DPM

## 2018-11-14 ENCOUNTER — Other Ambulatory Visit: Payer: Self-pay

## 2018-11-14 ENCOUNTER — Ambulatory Visit: Payer: 59

## 2018-11-14 DIAGNOSIS — L600 Ingrowing nail: Secondary | ICD-10-CM

## 2018-11-14 NOTE — Patient Instructions (Signed)

## 2018-11-18 NOTE — Progress Notes (Signed)
Patient is here today for follow-up appointment, recent procedure performed on 11/07/2018, removal of ingrown toenails bilateral big toes.  She states that the areas are a little tender and sore at times, but overall everything is doing well.  No erythema, no swelling, no drainage, no other signs and symptoms of infection.  Area is scabbed over and healing well at this time.  Advised patient on signs and symptoms of infection, verbal and written instructions were given.  She is to follow-up as needed with any acute symptom changes.

## 2018-11-30 MED FILL — VENLAFAXINE HCL ER 75 MG CA: 75 | 30 days supply | Qty: 30 | Fill #1

## 2018-12-01 MED FILL — LITHIUM CARBONATE ER 300 MG: 300 | 30 days supply | Qty: 120 | Fill #0

## 2018-12-22 DIAGNOSIS — Z1159 Encounter for screening for other viral diseases: Secondary | ICD-10-CM | POA: Diagnosis not present

## 2018-12-22 DIAGNOSIS — Z01818 Encounter for other preprocedural examination: Secondary | ICD-10-CM | POA: Diagnosis not present

## 2018-12-24 DIAGNOSIS — G8918 Other acute postprocedural pain: Secondary | ICD-10-CM | POA: Diagnosis not present

## 2018-12-24 DIAGNOSIS — F419 Anxiety disorder, unspecified: Secondary | ICD-10-CM | POA: Diagnosis not present

## 2018-12-24 DIAGNOSIS — M24152 Other articular cartilage disorders, left hip: Secondary | ICD-10-CM | POA: Diagnosis not present

## 2018-12-24 DIAGNOSIS — Q6589 Other specified congenital deformities of hip: Secondary | ICD-10-CM | POA: Diagnosis not present

## 2018-12-24 DIAGNOSIS — M25852 Other specified joint disorders, left hip: Secondary | ICD-10-CM | POA: Diagnosis not present

## 2018-12-24 DIAGNOSIS — Z4789 Encounter for other orthopedic aftercare: Secondary | ICD-10-CM | POA: Diagnosis not present

## 2018-12-24 DIAGNOSIS — D649 Anemia, unspecified: Secondary | ICD-10-CM | POA: Diagnosis not present

## 2018-12-24 DIAGNOSIS — M25551 Pain in right hip: Secondary | ICD-10-CM | POA: Diagnosis not present

## 2018-12-24 DIAGNOSIS — J309 Allergic rhinitis, unspecified: Secondary | ICD-10-CM | POA: Diagnosis not present

## 2018-12-24 DIAGNOSIS — Z01818 Encounter for other preprocedural examination: Secondary | ICD-10-CM | POA: Diagnosis not present

## 2018-12-24 DIAGNOSIS — T8484XA Pain due to internal orthopedic prosthetic devices, implants and grafts, initial encounter: Secondary | ICD-10-CM | POA: Diagnosis not present

## 2018-12-24 DIAGNOSIS — F329 Major depressive disorder, single episode, unspecified: Secondary | ICD-10-CM | POA: Diagnosis not present

## 2018-12-24 DIAGNOSIS — F319 Bipolar disorder, unspecified: Secondary | ICD-10-CM | POA: Diagnosis not present

## 2018-12-24 DIAGNOSIS — M24159 Other articular cartilage disorders, unspecified hip: Secondary | ICD-10-CM | POA: Diagnosis not present

## 2018-12-26 DIAGNOSIS — Q6589 Other specified congenital deformities of hip: Secondary | ICD-10-CM | POA: Diagnosis not present

## 2018-12-26 DIAGNOSIS — M24152 Other articular cartilage disorders, left hip: Secondary | ICD-10-CM | POA: Diagnosis not present

## 2018-12-26 DIAGNOSIS — D649 Anemia, unspecified: Secondary | ICD-10-CM | POA: Diagnosis not present

## 2018-12-26 DIAGNOSIS — F319 Bipolar disorder, unspecified: Secondary | ICD-10-CM | POA: Diagnosis not present

## 2018-12-26 DIAGNOSIS — F419 Anxiety disorder, unspecified: Secondary | ICD-10-CM | POA: Diagnosis not present

## 2018-12-26 DIAGNOSIS — F329 Major depressive disorder, single episode, unspecified: Secondary | ICD-10-CM | POA: Diagnosis not present

## 2018-12-26 DIAGNOSIS — T8484XA Pain due to internal orthopedic prosthetic devices, implants and grafts, initial encounter: Secondary | ICD-10-CM | POA: Diagnosis not present

## 2018-12-26 DIAGNOSIS — M25852 Other specified joint disorders, left hip: Secondary | ICD-10-CM | POA: Diagnosis not present

## 2018-12-26 MED ORDER — GENERIC EXTERNAL MEDICATION
Status: DC
Start: ? — End: 2018-12-26

## 2018-12-26 MED ORDER — LIDOCAINE HCL (PF) 1 % IJ SOLN
.50 | INTRAMUSCULAR | Status: DC
Start: ? — End: 2018-12-26

## 2018-12-26 MED ORDER — VENLAFAXINE HCL ER 75 MG PO CP24
75.00 | ORAL_CAPSULE | ORAL | Status: DC
Start: 2018-12-28 — End: 2018-12-26

## 2018-12-26 MED ORDER — HYDROMORPHONE HCL 1 MG/ML IJ SOLN
.50 | INTRAMUSCULAR | Status: DC
Start: ? — End: 2018-12-26

## 2018-12-26 MED ORDER — BUSPIRONE HCL 5 MG PO TABS
15.00 | ORAL_TABLET | ORAL | Status: DC
Start: ? — End: 2018-12-26

## 2018-12-26 MED ORDER — BISACODYL 10 MG RE SUPP
10.00 | RECTAL | Status: DC
Start: ? — End: 2018-12-26

## 2018-12-26 MED ORDER — POLYETHYLENE GLYCOL 3350 17 G PO PACK
17.00 | PACK | ORAL | Status: DC
Start: 2018-12-28 — End: 2018-12-26

## 2018-12-26 MED ORDER — MELOXICAM 15 MG PO TABS
15.00 | ORAL_TABLET | ORAL | Status: DC
Start: 2018-12-28 — End: 2018-12-26

## 2018-12-26 MED ORDER — HYDROCORTISONE 1 % EX CREA
TOPICAL_CREAM | CUTANEOUS | Status: DC
Start: 2018-12-28 — End: 2018-12-26

## 2018-12-26 MED ORDER — ACETAMINOPHEN 325 MG PO TABS
975.00 | ORAL_TABLET | ORAL | Status: DC
Start: 2018-12-28 — End: 2018-12-26

## 2018-12-26 MED ORDER — NALOXONE HCL 0.4 MG/ML IJ SOLN
0.20 | INTRAMUSCULAR | Status: DC
Start: ? — End: 2018-12-26

## 2018-12-26 MED ORDER — LACTATED RINGERS IV SOLN
INTRAVENOUS | Status: DC
Start: ? — End: 2018-12-26

## 2018-12-26 MED ORDER — ASPIRIN EC 325 MG PO TBEC
325.00 | DELAYED_RELEASE_TABLET | ORAL | Status: DC
Start: 2018-12-28 — End: 2018-12-26

## 2018-12-26 MED ORDER — HYDROMORPHONE HCL 1 MG/ML IJ SOLN
1.00 | INTRAMUSCULAR | Status: DC
Start: ? — End: 2018-12-26

## 2018-12-26 MED ORDER — LITHIUM CARBONATE ER 300 MG PO TBCR
1200.00 | EXTENDED_RELEASE_TABLET | ORAL | Status: DC
Start: 2018-12-28 — End: 2018-12-26

## 2018-12-26 MED ORDER — CYCLOBENZAPRINE HCL 10 MG PO TABS
10.00 | ORAL_TABLET | ORAL | Status: DC
Start: ? — End: 2018-12-26

## 2018-12-26 MED ORDER — OXYCODONE HCL 5 MG PO TABS
5.00 | ORAL_TABLET | ORAL | Status: DC
Start: ? — End: 2018-12-26

## 2018-12-26 MED ORDER — GABAPENTIN 300 MG PO CAPS
300.00 | ORAL_CAPSULE | ORAL | Status: DC
Start: 2018-12-28 — End: 2018-12-26

## 2018-12-26 MED ORDER — SENNOSIDES-DOCUSATE SODIUM 8.6-50 MG PO TABS
1.00 | ORAL_TABLET | ORAL | Status: DC
Start: 2018-12-28 — End: 2018-12-26

## 2018-12-26 MED FILL — GABAPENTIN 300 MG CAPSULE: 300 | 30 days supply | Qty: 90 | Fill #0

## 2018-12-26 MED FILL — ACETAMINOPHEN 325 MG TABS: 325 | 10 days supply | Qty: 100 | Fill #0

## 2018-12-26 MED FILL — STOOL SOFTENER-LAXATIVE TAB: 50-8.6 | 7 days supply | Qty: 28 | Fill #0

## 2018-12-26 MED FILL — oxyCODONE HCL 5 MG TABS: 5 | 7 days supply | Qty: 35 | Fill #0

## 2018-12-26 MED FILL — ONDANSETRON ODT 4 MG TABLET: 4 | 5 days supply | Qty: 20 | Fill #0

## 2018-12-26 MED FILL — MELOXICAM 15 MG TABLET: 15 | 30 days supply | Qty: 30 | Fill #0

## 2018-12-26 MED FILL — ASPIRIN EC 325 MG TABLET: 325 | 30 days supply | Qty: 60 | Fill #0

## 2018-12-28 DIAGNOSIS — Z79891 Long term (current) use of opiate analgesic: Secondary | ICD-10-CM | POA: Diagnosis not present

## 2018-12-28 DIAGNOSIS — Z9181 History of falling: Secondary | ICD-10-CM | POA: Diagnosis not present

## 2018-12-28 DIAGNOSIS — F419 Anxiety disorder, unspecified: Secondary | ICD-10-CM | POA: Diagnosis not present

## 2018-12-28 DIAGNOSIS — Q6589 Other specified congenital deformities of hip: Secondary | ICD-10-CM | POA: Diagnosis not present

## 2018-12-28 DIAGNOSIS — Z4789 Encounter for other orthopedic aftercare: Secondary | ICD-10-CM | POA: Diagnosis not present

## 2018-12-28 DIAGNOSIS — F319 Bipolar disorder, unspecified: Secondary | ICD-10-CM | POA: Diagnosis not present

## 2018-12-28 DIAGNOSIS — Z7982 Long term (current) use of aspirin: Secondary | ICD-10-CM | POA: Diagnosis not present

## 2018-12-28 DIAGNOSIS — J309 Allergic rhinitis, unspecified: Secondary | ICD-10-CM | POA: Diagnosis not present

## 2018-12-29 ENCOUNTER — Other Ambulatory Visit: Payer: Self-pay | Admitting: *Deleted

## 2018-12-29 MED ORDER — GENERIC EXTERNAL MEDICATION
Status: DC
Start: ? — End: 2018-12-29

## 2018-12-29 NOTE — Patient Outreach (Signed)
[facility]) Care Management  12/29/2018  Khala Tarte [birth year] [id number]   Transition of care call/case closure   Referral received : 7/30 Initial outreach : 12/29/18 Insurance : [place] plan    Subjective : Successful telephone outreach call to patient's preferred in order to complete transition of care assessment;2 HIPAA identifiers verified. Explained purpose of the call and completed transition of care assessment.  Maryiah states that she is doing pretty good with recovery. She reports she is using walker at home, home health therapy has visited and she had a good visit and further home visits have been established.  She reports that her pain has been controlled with use of tylenol . Umi discussed her appetite is good and her bowels have started to move, and urinating without problems. She reports having a dressings of Aquacel to right and left hips, intact she denies having a temperature or signs of infection, she understands when she can take a shower. Patient reports having her mother and other family present to assist her with recovery. Patient gave verbal consent to speak with her mother Juliann Pulse ( Case Manager at [employer] that she is on FMLA at this time.   Objective:   [place] was hospitalized at   [facility] from 7/29-8/1 /20 for Osteotomy, iliac acetabular or innomiate bone , removal of screws right hip. Comorbidities include:right hip dysplasia, left hip dysplasia  depression . Marland Kitchen  Patient was discharged home on 12/27/2018 with [facility], patient had initial visit on 8/1 .  Assessment  Patient voices good understanding of all discharge instructions. See transition of care flowsheet for assessment details.   Plan  Reviewed hospital discharge diagnosis ofOsteotomy, iliac acetabular or innomiate bone , removal of screws right hip   and treatment plan using hospital discharge instructions, assessing  medication adherence, reviewing postoperative problems requiring provider notification and discussing the importance of follow up with surgeon, primary care provider and specialist as directed.  Reviewed [place]'s Active Health Management 2020 Wellness Requirements of : Completing the computerized Health Assessment and the Health Action Step with Active Health Management [facility]) by January 27 2019 AND have an annual physical between May 28, 2017 and January 27, 2019 in order to qualify for the 2021 Healthy Lifestyle Premium. - Physical deadline has been extended. Due to COVID 19 and the need to postpone routine annual physicals, the deadline for your annual physical has been extended to Sept.1, 2020 . You do not need to have your physical complete by January 27, 2019, rescheduling your appointment by Sept.1, 2020 will count as completion of this requirement no matter when the actual physical is completed. Please send your rescheduled appointment information to [email or web address].  Reviewed [place]'s chronic disease management program benefit with Active Health Management and encouraged to contact them at [phone] or at [email or web address] to enroll or for questions about managing his /her chronic disease states.  .  No ongoing care management needs identified so will close case to [place] Management  services and route successful outreach letter with [facility] Management pamphlet and 24 Hour Nurse Line Magnet to [facility] Management clinical pool to be mailed to patient's home address.   Joylene Draft, RN, [place] Management Coordinator  [phone]- Mobile [phone]- Toll Free Main Office

## 2018-12-30 DIAGNOSIS — F319 Bipolar disorder, unspecified: Secondary | ICD-10-CM | POA: Diagnosis not present

## 2018-12-30 DIAGNOSIS — Z4789 Encounter for other orthopedic aftercare: Secondary | ICD-10-CM | POA: Diagnosis not present

## 2018-12-30 DIAGNOSIS — Z7982 Long term (current) use of aspirin: Secondary | ICD-10-CM | POA: Diagnosis not present

## 2018-12-30 DIAGNOSIS — Q6589 Other specified congenital deformities of hip: Secondary | ICD-10-CM | POA: Diagnosis not present

## 2018-12-30 DIAGNOSIS — F419 Anxiety disorder, unspecified: Secondary | ICD-10-CM | POA: Diagnosis not present

## 2018-12-30 DIAGNOSIS — Z9181 History of falling: Secondary | ICD-10-CM | POA: Diagnosis not present

## 2018-12-30 DIAGNOSIS — J309 Allergic rhinitis, unspecified: Secondary | ICD-10-CM | POA: Diagnosis not present

## 2018-12-30 DIAGNOSIS — Z79891 Long term (current) use of opiate analgesic: Secondary | ICD-10-CM | POA: Diagnosis not present

## 2019-01-01 DIAGNOSIS — F419 Anxiety disorder, unspecified: Secondary | ICD-10-CM | POA: Diagnosis not present

## 2019-01-01 DIAGNOSIS — Z4789 Encounter for other orthopedic aftercare: Secondary | ICD-10-CM | POA: Diagnosis not present

## 2019-01-01 DIAGNOSIS — F319 Bipolar disorder, unspecified: Secondary | ICD-10-CM | POA: Diagnosis not present

## 2019-01-01 DIAGNOSIS — Z7982 Long term (current) use of aspirin: Secondary | ICD-10-CM | POA: Diagnosis not present

## 2019-01-01 DIAGNOSIS — Q6589 Other specified congenital deformities of hip: Secondary | ICD-10-CM | POA: Diagnosis not present

## 2019-01-01 DIAGNOSIS — Z79891 Long term (current) use of opiate analgesic: Secondary | ICD-10-CM | POA: Diagnosis not present

## 2019-01-01 DIAGNOSIS — Z9181 History of falling: Secondary | ICD-10-CM | POA: Diagnosis not present

## 2019-01-01 DIAGNOSIS — J309 Allergic rhinitis, unspecified: Secondary | ICD-10-CM | POA: Diagnosis not present

## 2019-01-02 MED FILL — LITHIUM CARBONATE ER 300 MG: 300 | 30 days supply | Qty: 120 | Fill #1

## 2019-01-02 MED FILL — VENLAFAXINE HCL ER 75 MG CA: 75 | 30 days supply | Qty: 30 | Fill #2

## 2019-01-05 DIAGNOSIS — F319 Bipolar disorder, unspecified: Secondary | ICD-10-CM | POA: Diagnosis not present

## 2019-01-05 DIAGNOSIS — F419 Anxiety disorder, unspecified: Secondary | ICD-10-CM | POA: Diagnosis not present

## 2019-01-05 DIAGNOSIS — J309 Allergic rhinitis, unspecified: Secondary | ICD-10-CM | POA: Diagnosis not present

## 2019-01-05 DIAGNOSIS — Z7982 Long term (current) use of aspirin: Secondary | ICD-10-CM | POA: Diagnosis not present

## 2019-01-05 DIAGNOSIS — Q6589 Other specified congenital deformities of hip: Secondary | ICD-10-CM | POA: Diagnosis not present

## 2019-01-05 DIAGNOSIS — Z9181 History of falling: Secondary | ICD-10-CM | POA: Diagnosis not present

## 2019-01-05 DIAGNOSIS — Z79891 Long term (current) use of opiate analgesic: Secondary | ICD-10-CM | POA: Diagnosis not present

## 2019-01-05 DIAGNOSIS — Z4789 Encounter for other orthopedic aftercare: Secondary | ICD-10-CM | POA: Diagnosis not present

## 2019-01-06 DIAGNOSIS — Q6589 Other specified congenital deformities of hip: Secondary | ICD-10-CM | POA: Diagnosis not present

## 2019-01-08 DIAGNOSIS — Z7982 Long term (current) use of aspirin: Secondary | ICD-10-CM | POA: Diagnosis not present

## 2019-01-08 DIAGNOSIS — Q6589 Other specified congenital deformities of hip: Secondary | ICD-10-CM | POA: Diagnosis not present

## 2019-01-08 DIAGNOSIS — F419 Anxiety disorder, unspecified: Secondary | ICD-10-CM | POA: Diagnosis not present

## 2019-01-08 DIAGNOSIS — F319 Bipolar disorder, unspecified: Secondary | ICD-10-CM | POA: Diagnosis not present

## 2019-01-08 DIAGNOSIS — Z79891 Long term (current) use of opiate analgesic: Secondary | ICD-10-CM | POA: Diagnosis not present

## 2019-01-08 DIAGNOSIS — Z4789 Encounter for other orthopedic aftercare: Secondary | ICD-10-CM | POA: Diagnosis not present

## 2019-01-08 DIAGNOSIS — J309 Allergic rhinitis, unspecified: Secondary | ICD-10-CM | POA: Diagnosis not present

## 2019-01-08 DIAGNOSIS — Z9181 History of falling: Secondary | ICD-10-CM | POA: Diagnosis not present

## 2019-01-13 DIAGNOSIS — J309 Allergic rhinitis, unspecified: Secondary | ICD-10-CM | POA: Diagnosis not present

## 2019-01-13 DIAGNOSIS — Q6589 Other specified congenital deformities of hip: Secondary | ICD-10-CM | POA: Diagnosis not present

## 2019-01-13 DIAGNOSIS — F319 Bipolar disorder, unspecified: Secondary | ICD-10-CM | POA: Diagnosis not present

## 2019-01-13 DIAGNOSIS — Z4789 Encounter for other orthopedic aftercare: Secondary | ICD-10-CM | POA: Diagnosis not present

## 2019-01-13 DIAGNOSIS — Z79891 Long term (current) use of opiate analgesic: Secondary | ICD-10-CM | POA: Diagnosis not present

## 2019-01-13 DIAGNOSIS — F419 Anxiety disorder, unspecified: Secondary | ICD-10-CM | POA: Diagnosis not present

## 2019-01-13 DIAGNOSIS — Z7982 Long term (current) use of aspirin: Secondary | ICD-10-CM | POA: Diagnosis not present

## 2019-01-13 DIAGNOSIS — Z9181 History of falling: Secondary | ICD-10-CM | POA: Diagnosis not present

## 2019-01-15 DIAGNOSIS — Z9181 History of falling: Secondary | ICD-10-CM | POA: Diagnosis not present

## 2019-01-15 DIAGNOSIS — F319 Bipolar disorder, unspecified: Secondary | ICD-10-CM | POA: Diagnosis not present

## 2019-01-15 DIAGNOSIS — Z7982 Long term (current) use of aspirin: Secondary | ICD-10-CM | POA: Diagnosis not present

## 2019-01-15 DIAGNOSIS — J309 Allergic rhinitis, unspecified: Secondary | ICD-10-CM | POA: Diagnosis not present

## 2019-01-15 DIAGNOSIS — F419 Anxiety disorder, unspecified: Secondary | ICD-10-CM | POA: Diagnosis not present

## 2019-01-15 DIAGNOSIS — Z79891 Long term (current) use of opiate analgesic: Secondary | ICD-10-CM | POA: Diagnosis not present

## 2019-01-15 DIAGNOSIS — Q6589 Other specified congenital deformities of hip: Secondary | ICD-10-CM | POA: Diagnosis not present

## 2019-01-15 DIAGNOSIS — Z4789 Encounter for other orthopedic aftercare: Secondary | ICD-10-CM | POA: Diagnosis not present

## 2019-01-20 DIAGNOSIS — J309 Allergic rhinitis, unspecified: Secondary | ICD-10-CM | POA: Diagnosis not present

## 2019-01-20 DIAGNOSIS — F319 Bipolar disorder, unspecified: Secondary | ICD-10-CM | POA: Diagnosis not present

## 2019-01-20 DIAGNOSIS — Z79891 Long term (current) use of opiate analgesic: Secondary | ICD-10-CM | POA: Diagnosis not present

## 2019-01-20 DIAGNOSIS — F419 Anxiety disorder, unspecified: Secondary | ICD-10-CM | POA: Diagnosis not present

## 2019-01-20 DIAGNOSIS — Q6589 Other specified congenital deformities of hip: Secondary | ICD-10-CM | POA: Diagnosis not present

## 2019-01-20 DIAGNOSIS — Z4789 Encounter for other orthopedic aftercare: Secondary | ICD-10-CM | POA: Diagnosis not present

## 2019-01-20 DIAGNOSIS — Z9181 History of falling: Secondary | ICD-10-CM | POA: Diagnosis not present

## 2019-01-20 DIAGNOSIS — Z7982 Long term (current) use of aspirin: Secondary | ICD-10-CM | POA: Diagnosis not present

## 2019-01-20 MED FILL — tiZANidine HCL 4 MG TABS: 4 | 10 days supply | Qty: 30 | Fill #0

## 2019-01-22 DIAGNOSIS — F319 Bipolar disorder, unspecified: Secondary | ICD-10-CM | POA: Diagnosis not present

## 2019-01-22 DIAGNOSIS — Z4789 Encounter for other orthopedic aftercare: Secondary | ICD-10-CM | POA: Diagnosis not present

## 2019-01-22 DIAGNOSIS — Z9181 History of falling: Secondary | ICD-10-CM | POA: Diagnosis not present

## 2019-01-22 DIAGNOSIS — Z79891 Long term (current) use of opiate analgesic: Secondary | ICD-10-CM | POA: Diagnosis not present

## 2019-01-22 DIAGNOSIS — J309 Allergic rhinitis, unspecified: Secondary | ICD-10-CM | POA: Diagnosis not present

## 2019-01-22 DIAGNOSIS — Q6589 Other specified congenital deformities of hip: Secondary | ICD-10-CM | POA: Diagnosis not present

## 2019-01-22 DIAGNOSIS — Z7982 Long term (current) use of aspirin: Secondary | ICD-10-CM | POA: Diagnosis not present

## 2019-01-22 DIAGNOSIS — F419 Anxiety disorder, unspecified: Secondary | ICD-10-CM | POA: Diagnosis not present

## 2019-01-26 DIAGNOSIS — Q6589 Other specified congenital deformities of hip: Secondary | ICD-10-CM | POA: Diagnosis not present

## 2019-01-26 DIAGNOSIS — F319 Bipolar disorder, unspecified: Secondary | ICD-10-CM | POA: Diagnosis not present

## 2019-01-26 DIAGNOSIS — Z4789 Encounter for other orthopedic aftercare: Secondary | ICD-10-CM | POA: Diagnosis not present

## 2019-01-26 DIAGNOSIS — Z9181 History of falling: Secondary | ICD-10-CM | POA: Diagnosis not present

## 2019-01-26 DIAGNOSIS — J309 Allergic rhinitis, unspecified: Secondary | ICD-10-CM | POA: Diagnosis not present

## 2019-01-26 DIAGNOSIS — Z7982 Long term (current) use of aspirin: Secondary | ICD-10-CM | POA: Diagnosis not present

## 2019-01-26 DIAGNOSIS — Z79891 Long term (current) use of opiate analgesic: Secondary | ICD-10-CM | POA: Diagnosis not present

## 2019-01-26 DIAGNOSIS — F419 Anxiety disorder, unspecified: Secondary | ICD-10-CM | POA: Diagnosis not present

## 2019-01-30 MED FILL — LITHIUM CARBONATE ER 300 MG: 300 | 5 days supply | Qty: 20 | Fill #0

## 2019-02-03 DIAGNOSIS — Z9181 History of falling: Secondary | ICD-10-CM | POA: Diagnosis not present

## 2019-02-03 DIAGNOSIS — Q6589 Other specified congenital deformities of hip: Secondary | ICD-10-CM | POA: Diagnosis not present

## 2019-02-03 DIAGNOSIS — F419 Anxiety disorder, unspecified: Secondary | ICD-10-CM | POA: Diagnosis not present

## 2019-02-03 DIAGNOSIS — J309 Allergic rhinitis, unspecified: Secondary | ICD-10-CM | POA: Diagnosis not present

## 2019-02-03 DIAGNOSIS — Z4789 Encounter for other orthopedic aftercare: Secondary | ICD-10-CM | POA: Diagnosis not present

## 2019-02-03 DIAGNOSIS — Z7982 Long term (current) use of aspirin: Secondary | ICD-10-CM | POA: Diagnosis not present

## 2019-02-03 DIAGNOSIS — Z79891 Long term (current) use of opiate analgesic: Secondary | ICD-10-CM | POA: Diagnosis not present

## 2019-02-03 DIAGNOSIS — F319 Bipolar disorder, unspecified: Secondary | ICD-10-CM | POA: Diagnosis not present

## 2019-02-07 MED FILL — VENLAFAXINE HCL ER 75 MG CA: 75 | 30 days supply | Qty: 30 | Fill #0

## 2019-02-07 MED FILL — LITHIUM CARBONATE ER 300 MG: 300 | 30 days supply | Qty: 120 | Fill #0

## 2019-02-07 MED FILL — busPIRone HCL 15 MG TABS: 15 | 30 days supply | Qty: 90 | Fill #0

## 2019-02-12 DIAGNOSIS — Q6589 Other specified congenital deformities of hip: Secondary | ICD-10-CM | POA: Diagnosis not present

## 2019-02-24 DIAGNOSIS — M25552 Pain in left hip: Secondary | ICD-10-CM | POA: Diagnosis not present

## 2019-02-28 DIAGNOSIS — Z23 Encounter for immunization: Secondary | ICD-10-CM | POA: Diagnosis not present

## 2019-03-03 DIAGNOSIS — M25552 Pain in left hip: Secondary | ICD-10-CM | POA: Diagnosis not present

## 2019-03-04 MED FILL — LITHIUM CARBONATE ER 300 MG: 300 | 30 days supply | Qty: 120 | Fill #1

## 2019-03-04 MED FILL — VENLAFAXINE HCL ER 75 MG CA: 75 | 30 days supply | Qty: 30 | Fill #1

## 2019-03-05 DIAGNOSIS — M25552 Pain in left hip: Secondary | ICD-10-CM | POA: Diagnosis not present

## 2019-03-09 DIAGNOSIS — M25552 Pain in left hip: Secondary | ICD-10-CM | POA: Diagnosis not present

## 2019-03-11 DIAGNOSIS — M25552 Pain in left hip: Secondary | ICD-10-CM | POA: Diagnosis not present

## 2019-03-16 DIAGNOSIS — M25552 Pain in left hip: Secondary | ICD-10-CM | POA: Diagnosis not present

## 2019-03-19 DIAGNOSIS — M25552 Pain in left hip: Secondary | ICD-10-CM | POA: Diagnosis not present

## 2019-03-24 DIAGNOSIS — M25552 Pain in left hip: Secondary | ICD-10-CM | POA: Diagnosis not present

## 2019-03-26 DIAGNOSIS — M25552 Pain in left hip: Secondary | ICD-10-CM | POA: Diagnosis not present

## 2019-03-30 DIAGNOSIS — M25552 Pain in left hip: Secondary | ICD-10-CM | POA: Diagnosis not present

## 2019-03-31 MED FILL — GABAPENTIN 300 MG CAPSULE: 300 | 30 days supply | Qty: 90 | Fill #0

## 2019-03-31 MED FILL — MELOXICAM 7.5 MG TABLET: 7.5 | 30 days supply | Qty: 30 | Fill #0

## 2019-04-01 DIAGNOSIS — L02221 Furuncle of abdominal wall: Secondary | ICD-10-CM | POA: Diagnosis not present

## 2019-04-01 DIAGNOSIS — L905 Scar conditions and fibrosis of skin: Secondary | ICD-10-CM | POA: Diagnosis not present

## 2019-04-01 DIAGNOSIS — L02223 Furuncle of chest wall: Secondary | ICD-10-CM | POA: Diagnosis not present

## 2019-04-01 DIAGNOSIS — L02222 Furuncle of back [any part, except buttock]: Secondary | ICD-10-CM | POA: Diagnosis not present

## 2019-04-01 MED FILL — CLINDAMYCIN PH 1% SOLUTION: 1 | 30 days supply | Qty: 60 | Fill #0

## 2019-04-02 DIAGNOSIS — M25552 Pain in left hip: Secondary | ICD-10-CM | POA: Diagnosis not present

## 2019-04-02 MED FILL — LITHIUM CARBONATE ER 300 MG: 300 | 30 days supply | Qty: 120 | Fill #2

## 2019-04-02 MED FILL — VENLAFAXINE HCL ER 75 MG CA: 75 | 30 days supply | Qty: 30 | Fill #2

## 2019-04-06 DIAGNOSIS — M25552 Pain in left hip: Secondary | ICD-10-CM | POA: Diagnosis not present

## 2019-04-08 DIAGNOSIS — M25552 Pain in left hip: Secondary | ICD-10-CM | POA: Diagnosis not present

## 2019-04-13 DIAGNOSIS — M25552 Pain in left hip: Secondary | ICD-10-CM | POA: Diagnosis not present

## 2019-04-15 DIAGNOSIS — M25552 Pain in left hip: Secondary | ICD-10-CM | POA: Diagnosis not present

## 2019-04-20 DIAGNOSIS — M25552 Pain in left hip: Secondary | ICD-10-CM | POA: Diagnosis not present

## 2019-04-27 DIAGNOSIS — M25552 Pain in left hip: Secondary | ICD-10-CM | POA: Diagnosis not present

## 2019-04-29 DIAGNOSIS — M25552 Pain in left hip: Secondary | ICD-10-CM | POA: Diagnosis not present

## 2019-05-04 DIAGNOSIS — M25552 Pain in left hip: Secondary | ICD-10-CM | POA: Diagnosis not present

## 2019-05-05 DIAGNOSIS — F319 Bipolar disorder, unspecified: Secondary | ICD-10-CM | POA: Diagnosis not present

## 2019-05-05 DIAGNOSIS — F419 Anxiety disorder, unspecified: Secondary | ICD-10-CM | POA: Diagnosis not present

## 2019-05-05 MED FILL — LITHIUM CARBONATE ER 300 MG: 300 | 30 days supply | Qty: 120 | Fill #0 | Status: TO

## 2019-05-05 MED FILL — VENLAFAXINE HCL ER 75 MG CA: 75 | 30 days supply | Qty: 30 | Fill #0 | Status: TO

## 2019-05-06 DIAGNOSIS — M25552 Pain in left hip: Secondary | ICD-10-CM | POA: Diagnosis not present

## 2019-05-08 MED FILL — LITHIUM CARBONATE ER 300 MG: 300 | 30 days supply | Qty: 120 | Fill #0

## 2019-05-08 MED FILL — VENLAFAXINE HCL ER 75 MG CA: 75 | 30 days supply | Qty: 30 | Fill #0

## 2019-05-11 DIAGNOSIS — M25552 Pain in left hip: Secondary | ICD-10-CM | POA: Diagnosis not present

## 2019-05-13 DIAGNOSIS — M25552 Pain in left hip: Secondary | ICD-10-CM | POA: Diagnosis not present

## 2019-05-19 DIAGNOSIS — M25552 Pain in left hip: Secondary | ICD-10-CM | POA: Diagnosis not present

## 2019-05-26 DIAGNOSIS — L309 Dermatitis, unspecified: Secondary | ICD-10-CM | POA: Diagnosis not present

## 2019-05-26 MED FILL — TRIAMCINOLONE ACETONIDE 0.5: 0.5 | 30 days supply | Qty: 60 | Fill #0

## 2019-06-02 DIAGNOSIS — M25552 Pain in left hip: Secondary | ICD-10-CM | POA: Diagnosis not present

## 2019-06-03 MED FILL — LITHIUM CARBONATE ER 300 MG: 300 | 30 days supply | Qty: 120 | Fill #1

## 2019-06-03 MED FILL — VENLAFAXINE HCL ER 75 MG CA: 75 | 30 days supply | Qty: 30 | Fill #1

## 2019-06-04 DIAGNOSIS — Q6589 Other specified congenital deformities of hip: Secondary | ICD-10-CM | POA: Diagnosis not present

## 2019-06-04 DIAGNOSIS — M25552 Pain in left hip: Secondary | ICD-10-CM | POA: Diagnosis not present

## 2019-06-04 DIAGNOSIS — Z9889 Other specified postprocedural states: Secondary | ICD-10-CM | POA: Diagnosis not present

## 2019-06-06 DIAGNOSIS — Z9889 Other specified postprocedural states: Secondary | ICD-10-CM | POA: Insufficient documentation

## 2019-06-09 DIAGNOSIS — M25552 Pain in left hip: Secondary | ICD-10-CM | POA: Diagnosis not present

## 2019-06-11 DIAGNOSIS — M25552 Pain in left hip: Secondary | ICD-10-CM | POA: Diagnosis not present

## 2019-06-16 DIAGNOSIS — M25552 Pain in left hip: Secondary | ICD-10-CM | POA: Diagnosis not present

## 2019-06-18 DIAGNOSIS — M25552 Pain in left hip: Secondary | ICD-10-CM | POA: Diagnosis not present

## 2019-06-23 DIAGNOSIS — M25552 Pain in left hip: Secondary | ICD-10-CM | POA: Diagnosis not present

## 2019-06-30 DIAGNOSIS — M25552 Pain in left hip: Secondary | ICD-10-CM | POA: Diagnosis not present

## 2019-07-03 ENCOUNTER — Other Ambulatory Visit (HOSPITAL_COMMUNITY): Payer: Self-pay

## 2019-07-03 DIAGNOSIS — L853 Xerosis cutis: Secondary | ICD-10-CM | POA: Diagnosis not present

## 2019-07-03 DIAGNOSIS — Z113 Encounter for screening for infections with a predominantly sexual mode of transmission: Secondary | ICD-10-CM | POA: Diagnosis not present

## 2019-07-03 DIAGNOSIS — L0292 Furuncle, unspecified: Secondary | ICD-10-CM | POA: Diagnosis not present

## 2019-07-03 DIAGNOSIS — Z124 Encounter for screening for malignant neoplasm of cervix: Secondary | ICD-10-CM | POA: Diagnosis not present

## 2019-07-03 DIAGNOSIS — Z01419 Encounter for gynecological examination (general) (routine) without abnormal findings: Secondary | ICD-10-CM | POA: Diagnosis not present

## 2019-07-03 MED FILL — CLINDAMYCIN PH 1% SOLUTION: 1 | 30 days supply | Qty: 60 | Fill #0

## 2019-07-05 MED FILL — LITHIUM CARBONATE ER 300 MG: 300 | 30 days supply | Qty: 120 | Fill #2

## 2019-07-05 MED FILL — VENLAFAXINE HCL ER 75 MG CA: 75 | 30 days supply | Qty: 30 | Fill #2

## 2019-07-07 DIAGNOSIS — M25552 Pain in left hip: Secondary | ICD-10-CM | POA: Diagnosis not present

## 2019-07-14 DIAGNOSIS — M25552 Pain in left hip: Secondary | ICD-10-CM | POA: Diagnosis not present

## 2019-07-29 DIAGNOSIS — M25552 Pain in left hip: Secondary | ICD-10-CM | POA: Diagnosis not present

## 2019-08-04 DIAGNOSIS — F319 Bipolar disorder, unspecified: Secondary | ICD-10-CM | POA: Diagnosis not present

## 2019-08-04 DIAGNOSIS — F419 Anxiety disorder, unspecified: Secondary | ICD-10-CM | POA: Diagnosis not present

## 2019-08-05 DIAGNOSIS — F319 Bipolar disorder, unspecified: Secondary | ICD-10-CM | POA: Diagnosis not present

## 2019-08-05 DIAGNOSIS — M25552 Pain in left hip: Secondary | ICD-10-CM | POA: Diagnosis not present

## 2019-08-06 MED FILL — VENLAFAXINE HCL ER 75 MG CA: 75 | 30 days supply | Qty: 30 | Fill #0

## 2019-08-06 MED FILL — LITHIUM CARBONATE ER 300 MG: 300 | 30 days supply | Qty: 120 | Fill #0

## 2019-08-12 DIAGNOSIS — M25552 Pain in left hip: Secondary | ICD-10-CM | POA: Diagnosis not present

## 2019-08-21 ENCOUNTER — Ambulatory Visit: Payer: 59 | Attending: Internal Medicine

## 2019-08-21 DIAGNOSIS — Z23 Encounter for immunization: Secondary | ICD-10-CM

## 2019-08-21 NOTE — Progress Notes (Signed)
   Covid-19 Vaccination Clinic  Name:  Amanda Gill    MRN: [id number] DOB: 1996/01/20  08/21/2019  [name] was observed post Covid-19 immunization for 15 minutes without incident. She was provided with Vaccine Information Sheet and instruction to access the V-Safe system.   [name] was instructed to call 911 with any severe reactions post vaccine: Marland Kitchen Difficulty breathing  . Swelling of face and throat  . A fast heartbeat  . A bad rash all over body  . Dizziness and weakness   Immunizations Administered    Name Date Dose VIS Date Route   Pfizer COVID-19 Vaccine 08/21/2019  1:16 PM 0.3 mL 05/08/2019 Intramuscular   Manufacturer: [employer], [employer]   Lot: [id number]   NDC: [address]-8

## 2019-08-26 DIAGNOSIS — M25552 Pain in left hip: Secondary | ICD-10-CM | POA: Diagnosis not present

## 2019-09-08 MED FILL — LITHIUM CARBONATE ER 300 MG: 300 | 30 days supply | Qty: 120 | Fill #1

## 2019-09-08 MED FILL — VENLAFAXINE HCL ER 75 MG CA: 75 | 30 days supply | Qty: 30 | Fill #1

## 2019-09-15 ENCOUNTER — Ambulatory Visit: Payer: 59 | Attending: Internal Medicine

## 2019-09-15 DIAGNOSIS — Z23 Encounter for immunization: Secondary | ICD-10-CM

## 2019-09-15 NOTE — Progress Notes (Signed)
   Covid-19 Vaccination Clinic  Name:  Amanda Gill    MRN: [id number] DOB: 1996-03-09  09/15/2019  [name] was observed post Covid-19 immunization for 15 minutes without incident. She was provided with Vaccine Information Sheet and instruction to access the V-Safe system.   [name] was instructed to call 911 with any severe reactions post vaccine: Marland Kitchen Difficulty breathing  . Swelling of face and throat  . A fast heartbeat  . A bad rash all over body  . Dizziness and weakness   Immunizations Administered    Name Date Dose VIS Date Route   Pfizer COVID-19 Vaccine 09/15/2019 12:03 PM 0.3 mL 07/22/2018 Intramuscular   Manufacturer: [employer], [employer]   Lot: [id number]   NDC: [address]-6

## 2019-10-05 ENCOUNTER — Ambulatory Visit (INDEPENDENT_AMBULATORY_CARE_PROVIDER_SITE_OTHER): Payer: 59 | Admitting: Podiatrist

## 2019-10-05 ENCOUNTER — Other Ambulatory Visit: Payer: Self-pay

## 2019-10-05 DIAGNOSIS — L600 Ingrowing nail: Secondary | ICD-10-CM | POA: Diagnosis not present

## 2019-10-05 NOTE — Patient Instructions (Signed)
I'll call you with the information about the different chemical to use on the ingrown toenail procedure.  Try a small slanted nail trimmer for your baby toenails-  They are very maneuverable and I think it would help.

## 2019-10-07 ENCOUNTER — Encounter: Payer: Self-pay | Admitting: Podiatrist

## 2019-10-07 NOTE — Progress Notes (Signed)
  Chief Complaint  Patient presents with  . Nail Problem    pt is here for a possible ingrown of the left big toenail, pt states that it has been going on for about a week and a half, pt states that it is not painful, but more of a itchy sensation.     HPI: Patient is 24 y.o. female who presents today for the concerns as listed above.  Patient states that a nail became tender and red at the proximal nail fold however now improved.  She states that sometimes it is itchy.  She also states that she has had multiple ingrown toenail procedures performed on the medial nail border of the left hallux and she still has a area that regrows.  Small toenails are also difficult for her to trim   Review of Systems No fevers, chills, nausea, muscle aches, no difficulty breathing, no calf pain, no chest pain or shortness of breath.   Physical Exam  GENERAL APPEARANCE: Alert, conversant. Appropriately groomed. No acute distress.   VASCULAR: Pedal pulses palpable DP and PT bilateral.  Capillary refill time is immediate to all digits,  Proximal to distal cooling it warm to warm.  Digital hair growth is present bilateral   NEUROLOGIC: sensation is intact epicritically and protectively to 5.07 monofilament at 5/5 sites bilateral.  Light touch is intact bilateral, vibratory sensation intact bilateral, achilles tendon reflex is intact bilateral.   MUSCULOSKELETAL: acceptable muscle strength, tone and stability bilateral.  No gross boney pedal deformities noted.  No pain, crepitus or limitation noted with foot and ankle range of motion bilateral.   DERMATOLOGIC: skin is warm, supple, and dry.  No open lesions noted.  No rash, no pre ulcerative lesions.  Left hallux nail proximal nail border is lifted slightly and darkened and discoloration.  No active sign of infection is noted.  Slight swelling of the left proximal nail fold compared to the right is noted.  Nail spicule on the medial aspect of the left hallux nail  is also present post phenol matrixectomy's    Assessment   Ingrown toenail medial nail border, resolved paronychia left hallux nail  Plan  Discussed treatment options and alternatives.  Discussed trying to do another matrixectomy of the nail spicule using a different chemical other than phenol.  I will look into this and see if I can order sodium hyperchloride and will let her know.  Also recommended a small nail trimmer with plant for her small toenails.

## 2019-11-04 DIAGNOSIS — F319 Bipolar disorder, unspecified: Secondary | ICD-10-CM | POA: Diagnosis not present

## 2019-11-04 DIAGNOSIS — F419 Anxiety disorder, unspecified: Secondary | ICD-10-CM | POA: Diagnosis not present

## 2019-11-11 MED FILL — VENLAFAXINE HCL ER 75 MG CA: 75 | 30 days supply | Qty: 30 | Fill #0

## 2019-11-17 DIAGNOSIS — F319 Bipolar disorder, unspecified: Secondary | ICD-10-CM | POA: Diagnosis not present

## 2019-12-03 DIAGNOSIS — M25552 Pain in left hip: Secondary | ICD-10-CM | POA: Diagnosis not present

## 2019-12-03 DIAGNOSIS — Z9889 Other specified postprocedural states: Secondary | ICD-10-CM | POA: Diagnosis not present

## 2019-12-04 MED FILL — LITHIUM ER 450 MG TABLET: 450 | 30 days supply | Qty: 90 | Fill #1

## 2019-12-05 MED FILL — VENLAFAXINE HCL ER 75 MG CA: 75 | 30 days supply | Qty: 30 | Fill #1

## 2019-12-08 DIAGNOSIS — F419 Anxiety disorder, unspecified: Secondary | ICD-10-CM | POA: Diagnosis not present

## 2019-12-08 DIAGNOSIS — F319 Bipolar disorder, unspecified: Secondary | ICD-10-CM | POA: Diagnosis not present

## 2019-12-18 DIAGNOSIS — F319 Bipolar disorder, unspecified: Secondary | ICD-10-CM | POA: Diagnosis not present

## 2019-12-18 DIAGNOSIS — R61 Generalized hyperhidrosis: Secondary | ICD-10-CM | POA: Diagnosis not present

## 2020-01-05 ENCOUNTER — Other Ambulatory Visit (HOSPITAL_COMMUNITY): Payer: Self-pay

## 2020-01-05 DIAGNOSIS — F319 Bipolar disorder, unspecified: Secondary | ICD-10-CM | POA: Diagnosis not present

## 2020-01-05 DIAGNOSIS — F419 Anxiety disorder, unspecified: Secondary | ICD-10-CM | POA: Diagnosis not present

## 2020-01-05 MED FILL — LITHIUM ER 450 MG TABLET: 450 | 30 days supply | Qty: 90 | Fill #2

## 2020-01-05 MED FILL — VENLAFAXINE HCL ER 75 MG CA: 75 | 30 days supply | Qty: 30 | Fill #0

## 2020-01-05 MED FILL — busPIRone HCL 15 MG TABS: 15 | 30 days supply | Qty: 90 | Fill #0

## 2020-01-10 DIAGNOSIS — Z20828 Contact with and (suspected) exposure to other viral communicable diseases: Secondary | ICD-10-CM | POA: Diagnosis not present

## 2020-01-25 DIAGNOSIS — Z135 Encounter for screening for eye and ear disorders: Secondary | ICD-10-CM | POA: Diagnosis not present

## 2020-01-25 DIAGNOSIS — H5213 Myopia, bilateral: Secondary | ICD-10-CM | POA: Diagnosis not present

## 2020-02-05 MED FILL — LITHIUM ER 450 MG TABLET: 450 | 30 days supply | Qty: 90 | Fill #0

## 2020-02-05 MED FILL — VENLAFAXINE HCL ER 75 MG CA: 75 | 30 days supply | Qty: 30 | Fill #1

## 2020-03-05 MED FILL — VENLAFAXINE HCL ER 75 MG CA: 75 | 30 days supply | Qty: 30 | Fill #2

## 2020-03-05 MED FILL — LITHIUM ER 450 MG TABLET: 450 | 30 days supply | Qty: 90 | Fill #1

## 2020-04-04 MED FILL — LITHIUM ER 450 MG TABLET: 450 | 30 days supply | Qty: 90 | Fill #2

## 2020-04-06 ENCOUNTER — Other Ambulatory Visit (HOSPITAL_COMMUNITY): Payer: Self-pay

## 2020-04-06 DIAGNOSIS — F319 Bipolar disorder, unspecified: Secondary | ICD-10-CM | POA: Diagnosis not present

## 2020-04-06 DIAGNOSIS — F419 Anxiety disorder, unspecified: Secondary | ICD-10-CM | POA: Diagnosis not present

## 2020-04-06 MED FILL — VENLAFAXINE HCL ER 75 MG CA: 75 | 30 days supply | Qty: 30 | Fill #0

## 2020-04-06 MED FILL — LITHIUM CARBONATE ER 300 MG: 300 | 30 days supply | Qty: 30 | Fill #0

## 2020-04-19 ENCOUNTER — Other Ambulatory Visit (HOSPITAL_COMMUNITY): Payer: Self-pay

## 2020-04-19 DIAGNOSIS — F319 Bipolar disorder, unspecified: Secondary | ICD-10-CM | POA: Diagnosis not present

## 2020-04-19 DIAGNOSIS — F419 Anxiety disorder, unspecified: Secondary | ICD-10-CM | POA: Diagnosis not present

## 2020-04-19 MED FILL — ARIPIPRAZOLE 5 MG TABS: 5 | 30 days supply | Qty: 30 | Fill #0

## 2020-05-06 ENCOUNTER — Other Ambulatory Visit (HOSPITAL_COMMUNITY): Payer: Self-pay

## 2020-05-06 DIAGNOSIS — F319 Bipolar disorder, unspecified: Secondary | ICD-10-CM | POA: Diagnosis not present

## 2020-05-06 DIAGNOSIS — F419 Anxiety disorder, unspecified: Secondary | ICD-10-CM | POA: Diagnosis not present

## 2020-05-06 MED FILL — LATUDA 20 MG TABLET: 20 | 30 days supply | Qty: 30 | Fill #0

## 2020-05-06 MED FILL — LITHIUM ER 450 MG TABLET: 450 | 30 days supply | Qty: 60 | Fill #0

## 2020-05-06 MED FILL — LITHIUM CARBONATE ER 300 MG: 300 | 30 days supply | Qty: 30 | Fill #0

## 2020-05-16 MED FILL — VENLAFAXINE HCL ER 75 MG CA: 75 | 30 days supply | Qty: 30 | Fill #1

## 2020-06-06 ENCOUNTER — Other Ambulatory Visit (HOSPITAL_COMMUNITY): Payer: Self-pay

## 2020-06-06 DIAGNOSIS — F419 Anxiety disorder, unspecified: Secondary | ICD-10-CM | POA: Diagnosis not present

## 2020-06-06 DIAGNOSIS — F319 Bipolar disorder, unspecified: Secondary | ICD-10-CM | POA: Diagnosis not present

## 2020-06-06 MED FILL — LATUDA 40 MG TABLET: 40 | 30 days supply | Qty: 30 | Fill #0

## 2020-06-09 DIAGNOSIS — F319 Bipolar disorder, unspecified: Secondary | ICD-10-CM | POA: Diagnosis not present

## 2020-06-14 MED FILL — VENLAFAXINE HCL ER 75 MG CA: 75 | 30 days supply | Qty: 30 | Fill #2

## 2020-06-18 MED FILL — LITHIUM CARBONATE ER 300 MG: 300 | 30 days supply | Qty: 30 | Fill #1

## 2020-06-18 MED FILL — LITHIUM ER 450 MG TABLET: 450 | 30 days supply | Qty: 60 | Fill #1

## 2020-06-20 MED FILL — CLINDAMYCIN PHOSPHATE 1 % S: 1 | 30 days supply | Qty: 60 | Fill #1

## 2020-07-08 ENCOUNTER — Other Ambulatory Visit (HOSPITAL_COMMUNITY): Payer: Self-pay | Admitting: Obstetrics

## 2020-07-08 DIAGNOSIS — N6459 Other signs and symptoms in breast: Secondary | ICD-10-CM | POA: Diagnosis not present

## 2020-07-08 DIAGNOSIS — Z01419 Encounter for gynecological examination (general) (routine) without abnormal findings: Secondary | ICD-10-CM | POA: Diagnosis not present

## 2020-07-08 DIAGNOSIS — I7300 Raynaud's syndrome without gangrene: Secondary | ICD-10-CM | POA: Diagnosis not present

## 2020-07-08 MED FILL — NIFEdipine ER OSMOTIC RELEA: 30 | 30 days supply | Qty: 30 | Fill #0

## 2020-07-11 ENCOUNTER — Other Ambulatory Visit (HOSPITAL_COMMUNITY): Payer: Self-pay

## 2020-07-11 DIAGNOSIS — F419 Anxiety disorder, unspecified: Secondary | ICD-10-CM | POA: Diagnosis not present

## 2020-07-11 DIAGNOSIS — F319 Bipolar disorder, unspecified: Secondary | ICD-10-CM | POA: Diagnosis not present

## 2020-07-11 MED FILL — VENLAFAXINE HCL ER 75 MG CA: 75 | 30 days supply | Qty: 30 | Fill #0

## 2020-07-11 MED FILL — LATUDA 40 MG TABLET: 40 | 30 days supply | Qty: 30 | Fill #0

## 2020-07-12 MED FILL — LITHIUM ER 450 MG TABLET: 450 | 30 days supply | Qty: 60 | Fill #0

## 2020-07-12 MED FILL — LITHIUM CARBONATE ER 300 MG: 300 | 30 days supply | Qty: 30 | Fill #2

## 2020-08-04 ENCOUNTER — Telehealth: Payer: Self-pay

## 2020-08-04 ENCOUNTER — Other Ambulatory Visit: Payer: Self-pay

## 2020-08-04 ENCOUNTER — Ambulatory Visit (INDEPENDENT_AMBULATORY_CARE_PROVIDER_SITE_OTHER): Payer: 59 | Admitting: Podiatry

## 2020-08-04 DIAGNOSIS — L603 Nail dystrophy: Secondary | ICD-10-CM | POA: Diagnosis not present

## 2020-08-04 DIAGNOSIS — L600 Ingrowing nail: Secondary | ICD-10-CM

## 2020-08-04 DIAGNOSIS — L608 Other nail disorders: Secondary | ICD-10-CM | POA: Diagnosis not present

## 2020-08-04 NOTE — Telephone Encounter (Signed)
Left great toenail sample mailed to [facility] for culture

## 2020-08-04 NOTE — Patient Instructions (Signed)
For instructions on how to put on your Night Splint, please visit [email or web address]   Plantar Fasciitis (Heel Spur Syndrome) with Rehab The plantar fascia is a fibrous, ligament-like, soft-tissue structure that spans the bottom of the foot. Plantar fasciitis is a condition that causes pain in the foot due to inflammation of the tissue. SYMPTOMS   Pain and tenderness on the underneath side of the foot.  Pain that worsens with standing or walking. CAUSES  Plantar fasciitis is caused by irritation and injury to the plantar fascia on the underneath side of the foot. Common mechanisms of injury include:  Direct trauma to bottom of the foot.  Damage to a small nerve that runs under the foot where the main fascia attaches to the heel bone.  Stress placed on the plantar fascia due to bone spurs. RISK INCREASES WITH:   Activities that place stress on the plantar fascia (running, jumping, pivoting, or cutting).  Poor strength and flexibility.  Improperly fitted shoes.  Tight calf muscles.  Flat feet.  Failure to warm-up properly before activity.  Obesity. PREVENTION  Warm up and stretch properly before activity.  Allow for adequate recovery between workouts.  Maintain physical fitness:  Strength, flexibility, and endurance.  Cardiovascular fitness.  Maintain a health body weight.  Avoid stress on the plantar fascia.  Wear properly fitted shoes, including arch supports for individuals who have flat feet.  PROGNOSIS  If treated properly, then the symptoms of plantar fasciitis usually resolve without surgery. However, occasionally surgery is necessary.  RELATED COMPLICATIONS   Recurrent symptoms that may result in a chronic condition.  Problems of the lower back that are caused by compensating for the injury, such as limping.  Pain or weakness of the foot during push-off following surgery.  Chronic inflammation, scarring, and partial or complete fascia tear,  occurring more often from repeated injections.  TREATMENT  Treatment initially involves the use of ice and medication to help reduce pain and inflammation. The use of strengthening and stretching exercises may help reduce pain with activity, especially stretches of the Achilles tendon. These exercises may be performed at home or with a therapist. Your caregiver may recommend that you use heel cups of arch supports to help reduce stress on the plantar fascia. Occasionally, corticosteroid injections are given to reduce inflammation. If symptoms persist for greater than 6 months despite non-surgical (conservative), then surgery may be recommended.   MEDICATION   If pain medication is necessary, then nonsteroidal anti-inflammatory medications, such as aspirin and ibuprofen, or other minor pain relievers, such as acetaminophen, are often recommended.  Do not take pain medication within 7 days before surgery.  Prescription pain relievers may be given if deemed necessary by your caregiver. Use only as directed and only as much as you need.  Corticosteroid injections may be given by your caregiver. These injections should be reserved for the most serious cases, because they may only be given a certain number of times.  HEAT AND COLD  Cold treatment (icing) relieves pain and reduces inflammation. Cold treatment should be applied for 10 to 15 minutes every 2 to 3 hours for inflammation and pain and immediately after any activity that aggravates your symptoms. Use ice packs or massage the area with a piece of ice (ice massage).  Heat treatment may be used prior to performing the stretching and strengthening activities prescribed by your caregiver, physical therapist, or athletic trainer. Use a heat pack or soak the injury in warm water.  SEEK IMMEDIATE MEDICAL CARE   IF:  Treatment seems to offer no benefit, or the condition worsens.  Any medications produce adverse side effects.  EXERCISES- RANGE OF  MOTION (ROM) AND STRETCHING EXERCISES - Plantar Fasciitis (Heel Spur Syndrome) These exercises may help you when beginning to rehabilitate your injury. Your symptoms may resolve with or without further involvement from your physician, physical therapist or athletic trainer. While completing these exercises, remember:   Restoring tissue flexibility helps normal motion to return to the joints. This allows healthier, less painful movement and activity.  An effective stretch should be held for at least 30 seconds.  A stretch should never be painful. You should only feel a gentle lengthening or release in the stretched tissue.  RANGE OF MOTION - Toe Extension, Flexion  Sit with your right / left leg crossed over your opposite knee.  Grasp your toes and gently pull them back toward the top of your foot. You should feel a stretch on the bottom of your toes and/or foot.  Hold this stretch for 10 seconds.  Now, gently pull your toes toward the bottom of your foot. You should feel a stretch on the top of your toes and or foot.  Hold this stretch for 10 seconds. Repeat  times. Complete this stretch 3 times per day.   RANGE OF MOTION - Ankle Dorsiflexion, Active Assisted  Remove shoes and sit on a chair that is preferably not on a carpeted surface.  Place right / left foot under knee. Extend your opposite leg for support.  Keeping your heel down, slide your right / left foot back toward the chair until you feel a stretch at your ankle or calf. If you do not feel a stretch, slide your bottom forward to the edge of the chair, while still keeping your heel down.  Hold this stretch for 10 seconds. Repeat 3 times. Complete this stretch 2 times per day.   STRETCH  Gastroc, Standing  Place hands on wall.  Extend right / left leg, keeping the front knee somewhat bent.  Slightly point your toes inward on your back foot.  Keeping your right / left heel on the floor and your knee straight, shift  your weight toward the wall, not allowing your back to arch.  You should feel a gentle stretch in the right / left calf. Hold this position for 10 seconds. Repeat 3 times. Complete this stretch 2 times per day.  STRETCH  Soleus, Standing  Place hands on wall.  Extend right / left leg, keeping the other knee somewhat bent.  Slightly point your toes inward on your back foot.  Keep your right / left heel on the floor, bend your back knee, and slightly shift your weight over the back leg so that you feel a gentle stretch deep in your back calf.  Hold this position for 10 seconds. Repeat 3 times. Complete this stretch 2 times per day.  STRETCH  Gastrocsoleus, Standing  Note: This exercise can place a lot of stress on your foot and ankle. Please complete this exercise only if specifically instructed by your caregiver.   Place the ball of your right / left foot on a step, keeping your other foot firmly on the same step.  Hold on to the wall or a rail for balance.  Slowly lift your other foot, allowing your body weight to press your heel down over the edge of the step.  You should feel a stretch in your right / left calf.  Hold this position   for 10 seconds.  Repeat this exercise with a slight bend in your right / left knee. Repeat 3 times. Complete this stretch 2 times per day.   STRENGTHENING EXERCISES - Plantar Fasciitis (Heel Spur Syndrome)  These exercises may help you when beginning to rehabilitate your injury. They may resolve your symptoms with or without further involvement from your physician, physical therapist or athletic trainer. While completing these exercises, remember:   Muscles can gain both the endurance and the strength needed for everyday activities through controlled exercises.  Complete these exercises as instructed by your physician, physical therapist or athletic trainer. Progress the resistance and repetitions only as guided.  STRENGTH - Towel Curls  Sit in  a chair positioned on a non-carpeted surface.  Place your foot on a towel, keeping your heel on the floor.  Pull the towel toward your heel by only curling your toes. Keep your heel on the floor. Repeat 3 times. Complete this exercise 2 times per day.  STRENGTH - Ankle Inversion  Secure one end of a rubber exercise band/tubing to a fixed object (table, pole). Loop the other end around your foot just before your toes.  Place your fists between your knees. This will focus your strengthening at your ankle.  Slowly, pull your big toe up and in, making sure the band/tubing is positioned to resist the entire motion.  Hold this position for 10 seconds.  Have your muscles resist the band/tubing as it slowly pulls your foot back to the starting position. Repeat 3 times. Complete this exercises 2 times per day.  Document Released: 05/14/2005 Document Revised: 08/06/2011 Document Reviewed: 08/26/2008 [employer] Patient Information 2014 [employer].  

## 2020-08-05 MED FILL — LITHIUM ER 450 MG TABLET: 450 | 30 days supply | Qty: 60 | Fill #2

## 2020-08-08 DIAGNOSIS — F319 Bipolar disorder, unspecified: Secondary | ICD-10-CM | POA: Diagnosis not present

## 2020-08-08 DIAGNOSIS — F419 Anxiety disorder, unspecified: Secondary | ICD-10-CM | POA: Diagnosis not present

## 2020-08-08 NOTE — Progress Notes (Signed)
Subjective: 25 year old female presents the office with concerns of reoccurring ingrown toenail on the left side.  She states this is been removed by 3 times and a piece of nail continues to grow back.  States that she had discussed with Dr. Irving Shows about alternative chemicals as opposed to the phenol.  There is only tender when it grows out.  No redness or drainage today.  No significant pain today.  Denies any systemic complaints such as fevers, chills, nausea, vomiting. No acute changes since last appointment, and no other complaints at this time.   Objective: AAO x3, NAD DP/PT pulses palpable bilaterally, CRT less than 3 seconds The left hallux toenails hypertrophic, dystrophic with brown discoloration.  There is a spicule of nail present on the nail fold with a nails previously been removed.  There is no edema, erythema, drainage or pus or any signs of infection noted today. No pain with calf compression, swelling, warmth, erythema  Assessment: Recurrent ingrown toenail  Plan: -All treatment options discussed with the patient including all alternatives, risks, complications.  -In regards to the ingrown toenail we discussed utilizing sodium hydroxide.  Office is ordered for her.  We discussed a Winograd procedure as well.  We will start with chemical matricectomy prior to this. -Patient encouraged to call the office with any questions, concerns, change in symptoms.   Vivi Barrack DPM

## 2020-08-11 ENCOUNTER — Telehealth: Payer: Self-pay | Admitting: Podiatry

## 2020-08-11 NOTE — Telephone Encounter (Signed)
Patient called and stated that she was waiting on someone to give her a call back about her ingrown toenail. She stated that a chemical needed to be order in order to do her ingrown nail procedure

## 2020-08-12 NOTE — Telephone Encounter (Signed)
I have called her back to let her know we are working on it. She had no further questions

## 2020-08-26 ENCOUNTER — Telehealth: Payer: Self-pay | Admitting: *Deleted

## 2020-08-26 DIAGNOSIS — I7300 Raynaud's syndrome without gangrene: Secondary | ICD-10-CM | POA: Diagnosis not present

## 2020-08-26 NOTE — Telephone Encounter (Signed)
Called and spoke with the patient and relayed the message per Dr Wagoner. Amanda Gill 

## 2020-08-26 NOTE — Telephone Encounter (Signed)
-----   Message from Vivi Barrack, DPM sent at 08/25/2020  3:21 PM EDT ----- Can you let her know at some point that we have ordered the sodium hydroxide for the ingrown toenail procedure. Morrie Sheldon has ordered it and we are waiting for it to come in.

## 2020-09-06 ENCOUNTER — Other Ambulatory Visit (HOSPITAL_COMMUNITY): Payer: Self-pay

## 2020-09-06 MED ORDER — NIFEDIPINE ER 60 MG PO TB24
60.0000 mg | ORAL_TABLET | Freq: Every day | ORAL | 5 refills | Status: DC
  Filled 2020-09-06: qty 30, 30d supply, fill #0
  Filled 2020-10-04: qty 30, 30d supply, fill #1
  Filled 2020-11-04: qty 30, 30d supply, fill #2
  Filled 2020-12-07: qty 30, 30d supply, fill #3
  Filled 2021-01-06: qty 30, 30d supply, fill #4
  Filled 2021-02-07: qty 30, 30d supply, fill #5

## 2020-09-07 ENCOUNTER — Other Ambulatory Visit (HOSPITAL_COMMUNITY): Payer: Self-pay

## 2020-09-07 DIAGNOSIS — B372 Candidiasis of skin and nail: Secondary | ICD-10-CM | POA: Diagnosis not present

## 2020-09-07 MED ORDER — KETOCONAZOLE 2 % EX CREA
TOPICAL_CREAM | CUTANEOUS | 1 refills | Status: DC
  Filled 2020-09-07: qty 60, 30d supply, fill #0

## 2020-09-07 MED ORDER — FLUCONAZOLE 150 MG PO TABS
ORAL_TABLET | ORAL | 1 refills | Status: DC
  Filled 2020-09-07: qty 3, 9d supply, fill #0

## 2020-09-08 ENCOUNTER — Other Ambulatory Visit (HOSPITAL_COMMUNITY): Payer: Self-pay

## 2020-09-08 DIAGNOSIS — F319 Bipolar disorder, unspecified: Secondary | ICD-10-CM | POA: Diagnosis not present

## 2020-09-08 DIAGNOSIS — F419 Anxiety disorder, unspecified: Secondary | ICD-10-CM | POA: Diagnosis not present

## 2020-09-08 MED ORDER — VENLAFAXINE HCL ER 75 MG PO CP24
ORAL_CAPSULE | ORAL | 2 refills | Status: DC
  Filled 2020-09-08: qty 30, 30d supply, fill #0
  Filled 2020-10-14: qty 30, 30d supply, fill #1
  Filled 2020-11-09: qty 30, 30d supply, fill #2

## 2020-09-08 MED ORDER — LITHIUM CARBONATE ER 450 MG PO TBCR
450.0000 mg | EXTENDED_RELEASE_TABLET | Freq: Every evening | ORAL | 2 refills | Status: DC
  Filled 2020-09-08: qty 30, 30d supply, fill #0

## 2020-09-08 MED ORDER — LITHIUM CARBONATE ER 300 MG PO TBCR
300.0000 mg | EXTENDED_RELEASE_TABLET | Freq: Every day | ORAL | 0 refills | Status: DC
  Filled 2020-09-08: qty 30, 30d supply, fill #0

## 2020-09-08 MED FILL — Lurasidone HCl Tab 40 MG: ORAL | 30 days supply | Qty: 30 | Fill #0 | Status: AC

## 2020-09-09 ENCOUNTER — Other Ambulatory Visit (HOSPITAL_COMMUNITY): Payer: Self-pay

## 2020-10-03 NOTE — Telephone Encounter (Signed)
Pt has called back to follow up about the chemical to preform her ingrow toenail. She wanted to know have we received it in the office.

## 2020-10-03 NOTE — Telephone Encounter (Signed)
Amanda Gill- we finally got it in. Please call her to let her know and we can get her scheduled

## 2020-10-04 NOTE — Telephone Encounter (Signed)
Called and spoke with the patient and relayed the message to the patient and stated that one of our schedulers would be calling to get an appointment. Misty Stanley

## 2020-10-05 ENCOUNTER — Other Ambulatory Visit (HOSPITAL_COMMUNITY): Payer: Self-pay

## 2020-10-06 ENCOUNTER — Other Ambulatory Visit (HOSPITAL_COMMUNITY): Payer: Self-pay

## 2020-10-06 MED ORDER — LITHIUM CARBONATE ER 300 MG PO TBCR
EXTENDED_RELEASE_TABLET | ORAL | 0 refills | Status: DC
  Filled 2020-10-06: qty 30, 30d supply, fill #0

## 2020-10-06 MED ORDER — LATUDA 40 MG PO TABS
ORAL_TABLET | ORAL | 2 refills | Status: DC
  Filled 2020-10-06: qty 30, 30d supply, fill #0
  Filled 2020-11-09: qty 30, 30d supply, fill #1
  Filled 2020-12-07: qty 30, 30d supply, fill #2

## 2020-10-06 MED ORDER — LITHIUM CARBONATE ER 450 MG PO TBCR
450.0000 mg | EXTENDED_RELEASE_TABLET | Freq: Every evening | ORAL | 2 refills | Status: DC
  Filled 2020-10-06: qty 30, 30d supply, fill #0

## 2020-10-11 ENCOUNTER — Other Ambulatory Visit (HOSPITAL_COMMUNITY): Payer: Self-pay

## 2020-10-11 DIAGNOSIS — F419 Anxiety disorder, unspecified: Secondary | ICD-10-CM | POA: Diagnosis not present

## 2020-10-11 DIAGNOSIS — F319 Bipolar disorder, unspecified: Secondary | ICD-10-CM | POA: Diagnosis not present

## 2020-10-11 MED ORDER — LATUDA 40 MG PO TABS
40.0000 mg | ORAL_TABLET | Freq: Every evening | ORAL | 2 refills | Status: DC
  Filled 2020-10-11: qty 30, 30d supply, fill #0

## 2020-10-11 MED ORDER — LITHIUM CARBONATE ER 300 MG PO TBCR
600.0000 mg | EXTENDED_RELEASE_TABLET | Freq: Every day | ORAL | 0 refills | Status: DC
  Filled 2020-10-11 – 2020-10-25 (×2): qty 60, 30d supply, fill #0

## 2020-10-12 ENCOUNTER — Other Ambulatory Visit (HOSPITAL_COMMUNITY): Payer: Self-pay

## 2020-10-14 ENCOUNTER — Other Ambulatory Visit (HOSPITAL_COMMUNITY): Payer: Self-pay

## 2020-10-15 ENCOUNTER — Other Ambulatory Visit (HOSPITAL_COMMUNITY): Payer: Self-pay

## 2020-10-18 ENCOUNTER — Ambulatory Visit (INDEPENDENT_AMBULATORY_CARE_PROVIDER_SITE_OTHER): Payer: 59 | Admitting: Podiatry

## 2020-10-18 ENCOUNTER — Other Ambulatory Visit: Payer: Self-pay

## 2020-10-18 DIAGNOSIS — L600 Ingrowing nail: Secondary | ICD-10-CM | POA: Diagnosis not present

## 2020-10-18 DIAGNOSIS — L603 Nail dystrophy: Secondary | ICD-10-CM | POA: Diagnosis not present

## 2020-10-18 NOTE — Patient Instructions (Signed)

## 2020-10-21 ENCOUNTER — Ambulatory Visit (INDEPENDENT_AMBULATORY_CARE_PROVIDER_SITE_OTHER): Payer: 59 | Admitting: Plastic Surgery

## 2020-10-21 ENCOUNTER — Other Ambulatory Visit: Payer: Self-pay

## 2020-10-21 ENCOUNTER — Encounter: Payer: Self-pay | Admitting: Plastic Surgery

## 2020-10-21 DIAGNOSIS — N62 Hypertrophy of breast: Secondary | ICD-10-CM | POA: Diagnosis not present

## 2020-10-21 DIAGNOSIS — G8929 Other chronic pain: Secondary | ICD-10-CM | POA: Diagnosis not present

## 2020-10-21 DIAGNOSIS — M546 Pain in thoracic spine: Secondary | ICD-10-CM | POA: Diagnosis not present

## 2020-10-21 DIAGNOSIS — M549 Dorsalgia, unspecified: Secondary | ICD-10-CM | POA: Insufficient documentation

## 2020-10-21 DIAGNOSIS — M542 Cervicalgia: Secondary | ICD-10-CM | POA: Diagnosis not present

## 2020-10-21 NOTE — Addendum Note (Signed)
Addended by: Peggye Form on: 10/21/2020 12:19 PM   Modules accepted: Orders

## 2020-10-21 NOTE — Progress Notes (Signed)
Patient ID: Amanda LopeMelanie A Sauerwein, female    DOB: 1995/12/28, 25 y.o.   MRN: [id number]   Chief Complaint  Patient presents with  . consult    Mammary Hyperplasia: The patient is a 25 y.o. female with a history of mammary hyperplasia for several years.  She has extremely large breasts causing symptoms that include the following: Back pain in the upper and lower back, including neck pain. She pulls or pins her bra straps to provide better lift and relief of the pressure and pain. She notices relief by holding her breast up manually.  Her shoulder straps cause grooves and pain and pressure that requires padding for relief. Pain medication is sometimes required with motrin and tylenol.  Activities that are hindered by enlarged breasts include: exercise and running.  She has tried supportive clothing as well as fitted bras without improvement.  She has used antifungal creams for the past 3 years.  Her breasts are extremely large and fairly symmetric but the right side is slightly longer than the left.  She has hyperpigmentation of the inframammary area on both sides.  The sternal to nipple distance on the right is 35 cm and the left is 33 cm.  The IMF distance is 15 cm.  She is 5 feet 9 inches tall and weighs 236 pounds.  The BMI = 34.9.  Preoperative bra size = G cup.  The patient would like to be a C/D cup size.  The estimated excess breast tissue to be removed at the time of surgery = 800 grams on the left and 800 grams on the right.  Mammogram history: None.  Family history of breast cancer: None.  Tobacco use: None.   The patient expresses the desire to pursue surgical intervention.  Past surgical history includes 2 hip surgeries.  If possible she would like to have this done before she starts school in the fall.  She has seen a chiropractor over a year ago and it did not help her symptoms.   Review of Systems  Constitutional: Positive for activity change. Negative for appetite change.  HENT:  Negative.   Eyes: Negative.   Respiratory: Negative.  Negative for chest tightness and shortness of breath.   Cardiovascular: Negative for leg swelling.  Gastrointestinal: Negative for abdominal distention and abdominal pain.  Endocrine: Negative.   Genitourinary: Negative.   Musculoskeletal: Positive for back pain.  Skin: Positive for color change and rash.  Hematological: Negative.   Psychiatric/Behavioral: Negative.     No past medical history on file.  Past Surgical History:  Procedure Laterality Date  . DENTAL SURGERY        Current Outpatient Medications:  .  ARIPiprazole (ABILIFY) 5 MG tablet, TAKE 1 TABLET BY MOUTH AT BEDTIME, Disp: 30 tablet, Rfl: 0 .  busPIRone (BUSPAR) 15 MG tablet, TAKE 1 TABLET UPTO 3 TIMES A DAY.  MAX 60 MG DAILY., Disp: , Rfl:  .  clindamycin (CLEOCIN T) 1 % external solution, clindamycin phosphate 1 % topical solution, Disp: , Rfl:  .  fluconazole (DIFLUCAN) 150 MG tablet, take 1 tablet by mouth every 3 days, Disp: 3 tablet, Rfl: 1 .  ketoconazole (NIZORAL) 2 % cream, Apply to affected area once daily, Disp: 60 g, Rfl: 1 .  Levonorgestrel (KYLEENA) 19.5 MG IUD, Kyleena 17.5 mcg/24 hrs (7561yrs) 19.5mg  intrauterine device  Take 1 device by intrauterine route., Disp: , Rfl:  .  lithium carbonate (ESKALITH) 450 MG CR tablet, 900 mg, Disp: , Rfl:  .  lithium carbonate (LITHOBID) 300 MG CR tablet, Take 2 tablets (600 mg total) by mouth daily., Disp: 60 tablet, Rfl: 0 .  lurasidone (LATUDA) 20 MG TABS tablet, TAKE 1 TABLET BY MOUTH AT BEDTIME (TAKE WITH AT LEAST 350 CALORIES), Disp: 30 tablet, Rfl: 0 .  NIFEdipine (ADALAT CC) 60 MG 24 hr tablet, Take 1 tablet by mouth daily for 30 days., Disp: 30 tablet, Rfl: 5 .  NIFEdipine (PROCARDIA-XL/NIFEDICAL-XL) 30 MG 24 hr tablet, TAKE 1 TABLET BY MOUTH ONCE A DAY, Disp: 30 tablet, Rfl: 11 .  triamcinolone cream (KENALOG) 0.5 %, , Disp: , Rfl:  .  venlafaxine XR (EFFEXOR-XR) 75 MG 24 hr capsule, 150 mg, Disp: , Rfl:   .  venlafaxine XR (EFFEXOR-XR) 75 MG 24 hr capsule, TAKE 1 CAPSULE BY MOUTH EVERY MORNING, Disp: 30 capsule, Rfl: 2 .  venlafaxine XR (EFFEXOR-XR) 75 MG 24 hr capsule, TAKE 1 CAPSULE BY MOUTH EVERY MORNING, Disp: 30 capsule, Rfl: 2 .  venlafaxine XR (EFFEXOR-XR) 75 MG 24 hr capsule, TAKE 1 CAPSULE BY MOUTH ONCE DAILY IN THE MORNING, Disp: 30 capsule, Rfl: 2 .  venlafaxine XR (EFFEXOR-XR) 75 MG 24 hr capsule, Take 1 capsule by mouth each morning, Disp: 30 capsule, Rfl: 2 .  azelastine (ASTELIN) 0.1 % nasal spray, 2 sprays by Each Nare route two (2) times a day as needed (congestion)., Disp: , Rfl:  .  buPROPion (WELLBUTRIN XL) 300 MG 24 hr tablet, bupropion HCl XL 300 mg 24 hr tablet, extended release, Disp: , Rfl:  .  cefdinir (OMNICEF) 300 MG capsule, TAKE 1 CAPSULE (300 MG TOTAL) BY MOUTH EVERY 12 HOURS, Disp: , Rfl:  .  cephALEXin (KEFLEX) 500 MG capsule, Take 1 capsule (500 mg total) by mouth 3 (three) times daily., Disp: 21 capsule, Rfl: 0 .  EPINEPHrine 0.3 mg/0.3 mL IJ SOAJ injection, Inject into the muscle. (Patient not taking: Reported on 10/21/2020), Disp: , Rfl:  .  fluticasone (FLONASE) 50 MCG/ACT nasal spray, 1 spray per nostril once daily.  May increase to twice daily for 1 week when symptoms are worse., Disp: , Rfl:  .  lithium carbonate (ESKALITH) 450 MG CR tablet, TAKE 2 TABLETS BY MOUTH AT BEDTIME, Disp: 90 tablet, Rfl: 2 .  lithium carbonate (ESKALITH) 450 MG CR tablet, TAKE 2 TABLETS BY MOUTH AT BEDTIME, Disp: 90 tablet, Rfl: 2 .  lithium carbonate (ESKALITH) 450 MG CR tablet, TAKE 3 TABLETS BY MOUTH EVERY NIGHT AT BEDTIME, Disp: 90 tablet, Rfl: 2 .  lithium carbonate (ESKALITH) 450 MG CR tablet, Take 1 tablet (450 mg total) by mouth at bedtime., Disp: 30 tablet, Rfl: 2 .  lithium carbonate (ESKALITH) 450 MG CR tablet, Take 1 tablet (450 mg total) by mouth at bedtime., Disp: 30 tablet, Rfl: 2 .  lithium carbonate (LITHOBID) 300 MG CR tablet, TAKE 1 TABLET BY MOUTH AT BEDTIME WITH  450 MG TABLETS, Disp: 30 tablet, Rfl: 2 .  lithium carbonate (LITHOBID) 300 MG CR tablet, TAKE 1 TABLET BY MOUTH AT BEDTIME (TAKE WITH 450MG  TABLETS), Disp: 30 tablet, Rfl: 2 .  lithium carbonate (LITHOBID) 300 MG CR tablet, TAKE 1 TABLET BY MOUTH AT BEDTIME (TAKE WITH 450MG  TABLETS), Disp: 30 tablet, Rfl: 0 .  lithium carbonate (LITHOBID) 300 MG CR tablet, Take 1 tablet (300 mg total) by mouth daily (take with 450mg  tablet), Disp: 30 tablet, Rfl: 0 .  lithium carbonate (LITHOBID) 300 MG CR tablet, Take 1 tablet by mouth daily (take with 450 mg tab), Disp:  30 tablet, Rfl: 0 .  lurasidone (LATUDA) 40 MG TABS tablet, TAKE 1 TABLET BY MOUTH AT BEDTIME WITH AT LEAST 350 CALORIES, Disp: 30 tablet, Rfl: 0 .  lurasidone (LATUDA) 40 MG TABS tablet, Take 1 tablet by mouth at bedtime (take with at least 350 calories), Disp: 30 tablet, Rfl: 2 .  lurasidone (LATUDA) 40 MG TABS tablet, Take 1 tablet (40 mg total) by mouth at bedtime (with at least 350 calories), Disp: 30 tablet, Rfl: 2 .  methylPREDNISolone (MEDROL DOSEPAK) 4 MG TBPK tablet, , Disp: , Rfl:  .  naproxen (NAPROSYN) 500 MG tablet, naproxen 500 mg tablet, Disp: , Rfl:  .  omeprazole (PRILOSEC) 20 MG capsule, Take 1 capsule (20 mg total) by mouth daily., Disp: 15 capsule, Rfl: 1 .  sertraline (ZOLOFT) 100 MG tablet, sertraline 100 mg tablet, Disp: , Rfl:    Objective:   Vitals:   10/21/20 0948  BP: 118/79  Pulse: 91  SpO2: 99%    Physical Exam Vitals and nursing note reviewed.  Constitutional:      Appearance: Normal appearance.  HENT:     Head: Normocephalic and atraumatic.  Eyes:     Pupils: Pupils are equal, round, and reactive to light.  Cardiovascular:     Rate and Rhythm: Normal rate.     Pulses: Normal pulses.  Pulmonary:     Effort: Pulmonary effort is normal.  Abdominal:     General: Abdomen is flat. There is no distension.     Tenderness: There is no abdominal tenderness.  Musculoskeletal:        General: No swelling  or deformity.  Skin:    General: Skin is warm.     Capillary Refill: Capillary refill takes less than 2 seconds.     Coloration: Skin is not jaundiced.     Findings: No bruising.  Neurological:     General: No focal deficit present.     Mental Status: She is alert and oriented to person, place, and time.  Psychiatric:        Behavior: Behavior normal.        Thought Content: Thought content normal.     Assessment & Plan:  Symptomatic mammary hypertrophy  Chronic bilateral thoracic back pain  Neck pain  The procedure the patient selected and that was best for the patient was discussed. The risk were discussed and include but not limited to the following:  Breast asymmetry, fluid accumulation, firmness of the breast, inability to breast feed, loss of nipple or areola, skin loss, change in skin and nipple sensation, fat necrosis of the breast tissue, bleeding, infection and healing delay.  There are risks of anesthesia and injury to nerves or blood vessels.  Allergic reaction to tape, suture and skin glue are possible.  There will be swelling.  Any of these can lead to the need for revisional surgery.  A breast reduction has potential to interfere with diagnostic procedures in the future.  This procedure is best done when the breast is fully developed.  Changes in the breast will continue to occur over time: pregnancy, weight gain or weigh loss.    Total time: 45 minutes. This includes time spent with the patient during the visit as well as time spent before and after the visit reviewing the chart, documenting the encounter and ordering pertinent studies. and literature emailed to the patient.   Physical therapy: Ordered Mammogram: Not indicated [facility]: We may need to do a referral but  patient is going to work on this on her own.  The patient is a good candidate for bilateral breast reduction with possible liposuction.  She knows to give [place] a call when she has completed  physical therapy.  Pictures were obtained of the patient and placed in the chart with the patient's or guardian's permission.   Alena Bills Sidra Oldfield, DO

## 2020-10-24 NOTE — Progress Notes (Signed)
Subjective: 25 year old female presents the office today for concerns of left medial hallux ingrown toenail as well as her to straighten the nail.  She wants to go and have the piece of nail removed as it is causing discomfort.  He is also asking for the nail to be filed down given the thickening.  Denies any swelling or redness to the toenail site.  The nail does cause discomfort at times. Denies any systemic complaints such as fevers, chills, nausea, vomiting. No acute changes since last appointment, and no other complaints at this time.   Objective: AAO x3, NAD DP/PT pulses palpable bilaterally, CRT less than 3 seconds Incurvation present to medial aspect left hallux toenail without any edema, erythema.  Generally nails dystrophic, discolored with brown discoloration.  Mild discomfort of the ingrown portion of the nail.  No other areas of discomfort. No pain with calf compression, swelling, warmth, erythema  Assessment: Left medial hallux symptomatic ingrown toenail, onychodystrophy  Plan: -All treatment options discussed with the patient including all alternatives, risks, complications.  -At this time, the patient is requesting partial nail removal with chemical matricectomy to the symptomatic portion of the nail. Risks and complications were discussed with the patient for which they understand and written consent was obtained. Under sterile conditions a total of 3 mL of a mixture of 2% lidocaine plain and 0.5% Marcaine plain was infiltrated in a hallux block fashion. Once anesthetized, the skin was prepped in sterile fashion. A tourniquet was then applied. Next the medial aspect of hallux nail border was then sharply excised making sure to remove the entire offending nail border. Once the nails were ensured to be removed area was debrided and the underlying skin was intact. There is no purulence identified in the procedure. Next sodium hydroxide was then applied under standard conditions and  copiously irrigated. Silvadene was applied. A dry sterile dressing was applied. After application of the dressing the tourniquet was removed and there is found to be an immediate capillary refill time to the digit. The patient tolerated the procedure well any complications. Post procedure instructions were discussed the patient for which he verbally understood. Follow-up in one week for nail check or sooner if any problems are to arise. Discussed signs/symptoms of infection and directed to call the office immediately should any occur or go directly to the emergency room. In the meantime, encouraged to call the office with any questions, concerns, changes symptoms. -Debrided the rest the toenail.  Discussed treatment options for this.  She does not want to have a total nail avulsion today. -Patient encouraged to call the office with any questions, concerns, change in symptoms.   Vivi Barrack DPM

## 2020-10-25 ENCOUNTER — Other Ambulatory Visit (HOSPITAL_COMMUNITY): Payer: Self-pay

## 2020-11-03 ENCOUNTER — Ambulatory Visit (INDEPENDENT_AMBULATORY_CARE_PROVIDER_SITE_OTHER): Payer: 59 | Admitting: Podiatry

## 2020-11-03 ENCOUNTER — Other Ambulatory Visit: Payer: Self-pay

## 2020-11-03 DIAGNOSIS — L603 Nail dystrophy: Secondary | ICD-10-CM

## 2020-11-03 DIAGNOSIS — L600 Ingrowing nail: Secondary | ICD-10-CM | POA: Diagnosis not present

## 2020-11-03 NOTE — Patient Instructions (Signed)

## 2020-11-04 ENCOUNTER — Other Ambulatory Visit (HOSPITAL_COMMUNITY): Payer: Self-pay

## 2020-11-07 ENCOUNTER — Encounter: Payer: Self-pay | Admitting: Physical Therapy

## 2020-11-07 ENCOUNTER — Ambulatory Visit: Payer: 59 | Attending: Plastic Surgery | Admitting: Physical Therapy

## 2020-11-07 ENCOUNTER — Other Ambulatory Visit: Payer: Self-pay

## 2020-11-07 DIAGNOSIS — G8929 Other chronic pain: Secondary | ICD-10-CM | POA: Insufficient documentation

## 2020-11-07 DIAGNOSIS — F319 Bipolar disorder, unspecified: Secondary | ICD-10-CM | POA: Diagnosis not present

## 2020-11-07 DIAGNOSIS — M25652 Stiffness of left hip, not elsewhere classified: Secondary | ICD-10-CM | POA: Insufficient documentation

## 2020-11-07 DIAGNOSIS — M546 Pain in thoracic spine: Secondary | ICD-10-CM | POA: Insufficient documentation

## 2020-11-07 DIAGNOSIS — M25651 Stiffness of right hip, not elsewhere classified: Secondary | ICD-10-CM | POA: Insufficient documentation

## 2020-11-07 DIAGNOSIS — F419 Anxiety disorder, unspecified: Secondary | ICD-10-CM | POA: Diagnosis not present

## 2020-11-07 NOTE — Therapy (Signed)
[facility] [address] [place], [place], [address] Phone: [phone]   Fax:  [phone]  Physical Therapy Evaluation  Patient Details  Name: Amanda Gill MRN: [id number] Date of Birth: [birth year] Referring Provider (PT): Foster Simpson, DO   Encounter Date: 11/07/2020   PT End of Session - 11/07/20 1552     Visit Number 1    Number of Visits 13    Date for PT Re-Evaluation 12/19/20    Authorization Type [facility]    PT Start Time 1450    PT Stop Time 1535    PT Time Calculation (min) 45 min    Activity Tolerance Patient tolerated treatment well    Behavior During Therapy [facility] for tasks assessed/performed             History reviewed. No pertinent past medical history.  Past Surgical History:  Procedure Laterality Date   DENTAL SURGERY      There were no vitals filed for this visit.    Subjective Assessment - 11/07/20 1505     Subjective Patient reports 5-6 years of nagging, aching upper back pain from between scapula and up but not in lumbar or cervical region.    Patient Stated Goals Decreased pain.    Currently in Pain? Yes    Pain Score 3    Max this week = 5/10   Pain Location Back    Pain Orientation Upper    Pain Type Chronic pain    Pain Onset More than a month ago    Pain Frequency Constant    Aggravating Factors  Delayed onset after lifting and bending activities.    Effect of Pain on Daily Activities Decreased ability to participate comfortably.                [facility] PT Assessment - 11/07/20 0001       Assessment   Medical Diagnosis M54.6 Back pain, N62 Mamary hypertorphy, M54.2 Neck pain    Referring Provider (PT) Dillingham, Alan Ripper, DO    Hand Dominance Left    Next MD Visit TBD    Prior Therapy YES - Hips      Precautions   Precautions None      Balance Screen   Has the patient fallen in the past 6 months No    Has the patient had a decrease in activity level  because of a fear of falling?  No      Home Tourist information centre manager residence      Prior Function   Level of Independence Independent    Vocation Student      Observation/Other Assessments   Observations No apparent distress    Focus on Therapeutic Outcomes (FOTO)  N/A      Sensation   Light Touch Appears Intact      Coordination   Gross Motor Movements are Fluid and Coordinated Yes      Strength   Right Shoulder Flexion 5/5    Right Shoulder ABduction 5/5    Left Shoulder Flexion 5/5    Left Shoulder ABduction 5/5    Right Hip Flexion 5/5    Left Hip Flexion 5/5      Palpation   Palpation comment TTP R/L thoracic paraspinals and PA glides R lower than left.      Special Tests    Special Tests Sacrolliac Tests    Sacroiliac Tests  Pelvic Distraction      Pelvic Dictraction  Findings Negative      Pelvic Compression   Findings Negative                        Objective measurements completed on examination: See above findings.       [facility] Adult PT Treatment/Exercise - 11/07/20 0001       Posture/Postural Control   Posture Comments protracted shoulers, mild head forward      Lumbar Exercises: Stretches   Other Lumbar Stretch Exercise Childs pose 3x10sec FWD/R/L    Other Lumbar Stretch Exercise Open book R/L x10ea      Lumbar Exercises: Supine   Bridge 10 reps      Lumbar Exercises: Quadruped   Single Arm Raise 10 reps    Straight Leg Raise 10 reps      Shoulder Exercises: Stretch   Table Stretch - Flexion Limitations    Table Stretch -Flexion Limitations Bilateral x10      Neck Exercises: Stretches   Lower Cervical/Upper Thoracic Stretch Limitations thoracic stretch over chair back 3x10sec                    PT Education - 11/07/20 1535     Education Details Results of evaluation, initial HEP for thoracic stretches and back strengthening, POC.    Person(s) Educated Patient    Methods  Explanation;Demonstration;Handout    Comprehension Verbalized understanding;Returned demonstration              PT Short Term Goals - 11/07/20 1603       PT SHORT TERM GOAL #1   Title Patient will be independent with initial HEP for symptom management.    Baseline No exercise program    Time 2    Period Weeks    Status New    Target Date 11/21/20      PT SHORT TERM GOAL #2   Title Patient to be instructed on proper body mechanics to reduce pain during functional tasks.    Baseline No knowledge    Time 2    Period Weeks    Status New    Target Date 11/21/20               PT Long Term Goals - 11/07/20 1604       PT LONG TERM GOAL #1   Title Patient will be independent with final HEP and progression to continue to reduce pain and symptoms after discharge.    Baseline No exercise program    Time 6    Period Weeks    Status New    Target Date 12/19/20      PT LONG TERM GOAL #2   Title Patient will demonstrate proper body mechanics for lifting 10# object from floor to table 10x with pain no more than 2/10 the next day to represent household chores.    Baseline Repeated heavy lifting causes increased delayed onset pain.    Time 6    Period Weeks    Status New    Target Date 12/19/20      PT LONG TERM GOAL #3   Title Patient will report no more than 1/10 pain throughout daily activity.    Baseline Constant minimum of 3/10 pain.    Time 6    Period Weeks    Status New    Target Date 12/19/20                    Plan -  11/07/20 1554     Clinical Impression Statement 25 yo female referred to OPPT with chronic upper back/neck pain with mammaryhypertrophy.  Patient reports mostly upper thoracic constant aching pain that increases the day after atypical lifting actviity, but not particularly during the activity. Patient is TTP along thoracic paraspinals and mild pain with PA glides along thoracic spine.  paraspinal pain starts lower on the right and higher  on the left. Patient will benefit from skilled PT to address deficits with initial emphesis on increased trunk ROM and back musculature strengthening transitioning to spinal assessment and treatment as appropriate.    Personal Factors and Comorbidities Time since onset of injury/illness/exacerbation    Examination-Activity Limitations Bend;Lift    Examination-Participation Restrictions Community Activity;Personal Finances    Stability/Clinical Decision Making Stable/Uncomplicated    Clinical Decision Making Low    Rehab Potential Good    PT Frequency 1x / week    PT Duration 6 weeks    PT Treatment/Interventions Functional mobility training;Therapeutic activities;Therapeutic exercise;Balance training;Electrical Stimulation;Moist Heat;Patient/family education;Manual techniques;Passive range of motion    PT Next Visit Plan Assess effectiveness of HEP and progress as appropriate, measure hamstring flexibility and add hamstring stretch, test plank ability.    PT Home Exercise Plan Access Code: [email or web address]  URL: [email or web address]  Date: 11/07/2020  Prepared by: [name]    Exercises  Sidelying Thoracic Rotation with Open Book - 1 x daily - 7 x weekly - 3 sets - 10 reps  Supine Bridge - 1 x daily - 7 x weekly - 3 sets - 10 reps  Seated Thoracic Lumbar Extension - 1 x daily - 7 x weekly - 3 sets - 10sec hold  Child's Pose with Sidebending - 1 x daily - 7 x weekly - 1 sets - 3 reps - 10sec hold  Bird Dog Progression - 1 x daily - 7 x weekly - 2 sets - 10 reps  Seated Bilateral Shoulder Flexion Towel Slide at Table Top - 1 x daily - 7 x weekly - 2 sets - 10 reps    Consulted and Agree with Plan of Care Patient             Patient will benefit from skilled therapeutic intervention in order to improve the following deficits and impairments:  Decreased range of motion, Pain, Impaired flexibility  Visit Diagnosis: Chronic bilateral thoracic back pain  Joint stiffness of both  hips     Problem List Patient Active Problem List   Diagnosis Date Noted   Symptomatic mammary hypertrophy 10/21/2020   Back pain 10/21/2020   Neck pain 10/21/2020    [name], PT 11/07/2020, 4:11 PM  [facility] [address] [place], [place], [address] Phone: [phone]   Fax:  [phone]  Name: Amanda Gill MRN: [id number] Date of Birth: Jun 23, 1995

## 2020-11-07 NOTE — Patient Instructions (Signed)
Access Code: [email or web address] URL: [email or web address] Date: 11/07/2020 Prepared by: [name]  Exercises Sidelying Thoracic Rotation with Open Book - 1 x daily - 7 x weekly - 3 sets - 10 reps Supine Bridge - 1 x daily - 7 x weekly - 3 sets - 10 reps Seated Thoracic Lumbar Extension - 1 x daily - 7 x weekly - 3 sets - 10sec hold Child's Pose with Sidebending - 1 x daily - 7 x weekly - 1 sets - 3 reps - 10sec hold Bird Dog Progression - 1 x daily - 7 x weekly - 2 sets - 10 reps Seated Bilateral Shoulder Flexion Towel Slide at Table Top - 1 x daily - 7 x weekly - 2 sets - 10 reps

## 2020-11-08 DIAGNOSIS — Z111 Encounter for screening for respiratory tuberculosis: Secondary | ICD-10-CM | POA: Diagnosis not present

## 2020-11-09 ENCOUNTER — Other Ambulatory Visit (HOSPITAL_COMMUNITY): Payer: Self-pay

## 2020-11-09 NOTE — Progress Notes (Signed)
Subjective: 25 year old female presents the office today for Evaluation of her having partial nail avulsion with chemical matricectomy performed on the left hallux.  She is procedure sites healing well and not having any significant pain but there is the nail is loose and she wants to go and remove the remainder of the nail.  She did not want to have this done last appointment and she talked to her mom about it and she wants to go to the rest the nail removed to see if it will grow back any better. Denies any systemic complaints such as fevers, chills, nausea, vomiting. No acute changes since last appointment, and no other complaints at this time.   Objective: AAO x3, NAD DP/PT pulses palpable bilaterally, CRT less than 3 seconds Procedure site appears to be healing well with still small granulation tissue.  No drainage or pus or ascending cellulitis.  Remainder the nail is hypertrophic, dystrophic and is loose from the underlying nail bed.  No edema, erythema or signs of infection. No pain with calf compression, swelling, warmth, erythema  Assessment: Left hallux onychomycosis, ingrown toenail  Plan: -All treatment options discussed with the patient including all alternatives, risks, complications.  -Patient wishes to have that rest the nail removed without chemical matricectomy.  Discussed risks and complications and she wishes to proceed and consent was signed.  Skin was prepped with alcohol and 3 cc of lidocaine were infiltrated around the toe in a digital block fashion.  Skin was prepped with Betadine.  Tourniquet applied.  The nail was removed in total making sure remove all offending nail borders.  Wound bed was irrigated.  Nailbed appears to be intact.  Hemostasis achieved.  Silvadene was applied followed by dressing.  Tourniquet was released and there was found to be immediate capillary fill time of the digit.  She tolerated well and complications.  Postinjection care discussed.  Vivi Barrack DPM  -Patient encouraged to call the office with any questions, concerns, change in symptoms.

## 2020-11-16 ENCOUNTER — Other Ambulatory Visit: Payer: Self-pay

## 2020-11-16 ENCOUNTER — Encounter: Payer: Self-pay | Admitting: Physical Therapy

## 2020-11-16 ENCOUNTER — Ambulatory Visit: Payer: 59 | Admitting: Physical Therapy

## 2020-11-16 DIAGNOSIS — M546 Pain in thoracic spine: Secondary | ICD-10-CM | POA: Diagnosis not present

## 2020-11-16 DIAGNOSIS — G8929 Other chronic pain: Secondary | ICD-10-CM | POA: Diagnosis not present

## 2020-11-16 DIAGNOSIS — M25652 Stiffness of left hip, not elsewhere classified: Secondary | ICD-10-CM | POA: Diagnosis not present

## 2020-11-16 DIAGNOSIS — M25651 Stiffness of right hip, not elsewhere classified: Secondary | ICD-10-CM | POA: Diagnosis not present

## 2020-11-16 NOTE — Therapy (Signed)
[facility] [address] [place], [place], [address] Phone: [phone]   Fax:  [phone]  Physical Therapy Treatment  Patient Details  Name: Amanda Gill MRN: [id number] Date of Birth: 14-Jul-1995 Referring Provider (PT): Foster Simpson, DO   Encounter Date: 11/16/2020   PT End of Session - 11/16/20 1220     Visit Number 2    Number of Visits 13    Date for PT Re-Evaluation 12/19/20    Authorization Type [facility] [employer]    PT Start Time 1220    PT Stop Time 1300    PT Time Calculation (min) 40 min    Activity Tolerance Patient tolerated treatment well;No increased pain    Behavior During Therapy [facility] for tasks assessed/performed             History reviewed. No pertinent past medical history.  Past Surgical History:  Procedure Laterality Date   DENTAL SURGERY      There were no vitals filed for this visit.   Subjective Assessment - 11/16/20 1224     Subjective Patient reports good compliance with HEP, no change in symptoms.    Patient Stated Goals Decreased pain.    Currently in Pain? Yes    Pain Score 2     Pain Location Back    Pain Orientation Upper                [facility] PT Assessment - 11/16/20 0001       Assessment   Medical Diagnosis M54.6 Back pain, N62 Mamary hypertorphy, M54.2 Neck pain    Referring Provider (PT) Dillingham, Alan Ripper, DO    Hand Dominance Left    Next MD Visit TBD    Prior Therapy YES - Hips      Precautions   Precautions None                           [facility] Adult PT Treatment/Exercise - 11/16/20 0001       Lumbar Exercises: Stretches   Passive Hamstring Stretch 30 seconds;2 reps    Passive Hamstring Stretch Limitations 1/2 long sit    Other Lumbar Stretch Exercise --      Lumbar Exercises: Standing   Other Standing Lumbar Exercises Pallof Press BLUE x10 ea dir      Lumbar Exercises: Prone   Single Arm Raise 10 reps    Single Arm Raises  Limitations alternate    Straight Leg Raise 10 reps    Straight Leg Raises Limitations Alternate    Opposite Arm/Leg Raise 10 reps      Lumbar Exercises: Quadruped   Opposite Arm/Leg Raise 10 reps    Plank 12sec elbows/toes      Shoulder Exercises: Stretch   Table Stretch -Flexion Limitations seated Ball roll out FWD/R/L 3sec x 10ea dir      Neck Exercises: Stretches   Chest Stretch 30 seconds;2 reps   UEs in W, starting with hands low, hands high causes tingling.                   PT Education - 11/16/20 1233     Education Details Additions to HEP, posture awareness.    Person(s) Educated Patient    Methods Explanation;Demonstration;Verbal cues;Handout    Comprehension Verbalized understanding;Returned demonstration              PT Short Term Goals - 11/16/20 1330       PT  SHORT TERM GOAL #1   Title Patient will be independent with initial HEP for symptom management.    Baseline No exercise program    Time 2    Period Weeks    Status Achieved               PT Long Term Goals - 11/07/20 1604       PT LONG TERM GOAL #1   Title Patient will be independent with final HEP and progression to continue to reduce pain and symptoms after discharge.    Baseline No exercise program    Time 6    Period Weeks    Status New    Target Date 12/19/20      PT LONG TERM GOAL #2   Title Patient will demonstrate proper body mechanics for lifting 10# object from floor to table 10x with pain no more than 2/10 the next day to represent household chores.    Baseline Repeated heavy lifting causes increased delayed onset pain.    Time 6    Period Weeks    Status New    Target Date 12/19/20      PT LONG TERM GOAL #3   Title Patient will report no more than 1/10 pain throughout daily activity.    Baseline Constant minimum of 3/10 pain.    Time 6    Period Weeks    Status New    Target Date 12/19/20                   Plan - 11/16/20 1317     Clinical  Impression Statement Patient tolerated progressed core stability activity and stretches to promote improving posture without increased pain. Patient given additions and modifications to HEP as well as education for attention to posture.  Patient could benefit from continued skilled PT to improve daily tasks with decrased pain.    Personal Factors and Comorbidities Time since onset of injury/illness/exacerbation    Examination-Activity Limitations Bend;Lift    Examination-Participation Restrictions Community Activity;Personal Finances    PT Treatment/Interventions Functional mobility training;Therapeutic activities;Therapeutic exercise;Balance training;Electrical Stimulation;Moist Heat;Patient/family education;Manual techniques;Passive range of motion    PT Next Visit Plan Assess effectiveness of HEP and progress as appropriate, Manual at appropriate, measure hamstring flexibility, educate on proper body mechanics.    PT Home Exercise Plan Access Code: [email or web address]    Consulted and Agree with Plan of Care Patient             Patient will benefit from skilled therapeutic intervention in order to improve the following deficits and impairments:  Decreased range of motion, Pain, Impaired flexibility  Visit Diagnosis: Chronic bilateral thoracic back pain  Joint stiffness of both hips     Problem List Patient Active Problem List   Diagnosis Date Noted   Symptomatic mammary hypertrophy 10/21/2020   Back pain 10/21/2020   Neck pain 10/21/2020    [name], PT 11/16/2020, 1:34 PM  [facility] [address] [place], [place], [address] Phone: [phone]   Fax:  [phone]  Name: Amanda Gill MRN: [id number] Date of Birth: 10-06-1995

## 2020-11-16 NOTE — Patient Instructions (Signed)
Access Code: [email or web address] URL: [email or web address] Date: 11/16/2020 Prepared by: [name]  NEW Exercises  Seated Scapular Retraction - 1 x daily - 7 x weekly - 1 sets - 10 reps - 5 sec hold Doorway Pec Stretch at 90 Degrees Abduction - 1 x daily - 7 x weekly - 1 sets - 3 reps - 30sec hold Seated Table Hamstring Stretch - 1 x daily - 7 x weekly - 2 sets Standard Plank - 1 x daily - 7 x weekly - 2 sets - 30sec hold

## 2020-11-23 ENCOUNTER — Ambulatory Visit: Payer: 59 | Admitting: Physical Therapy

## 2020-11-24 ENCOUNTER — Encounter: Payer: Self-pay | Admitting: Physical Therapy

## 2020-11-24 ENCOUNTER — Ambulatory Visit: Payer: 59 | Admitting: Physical Therapy

## 2020-11-24 ENCOUNTER — Other Ambulatory Visit: Payer: Self-pay

## 2020-11-24 DIAGNOSIS — Z23 Encounter for immunization: Secondary | ICD-10-CM | POA: Diagnosis not present

## 2020-11-24 DIAGNOSIS — M25651 Stiffness of right hip, not elsewhere classified: Secondary | ICD-10-CM | POA: Diagnosis not present

## 2020-11-24 DIAGNOSIS — G8929 Other chronic pain: Secondary | ICD-10-CM | POA: Diagnosis not present

## 2020-11-24 DIAGNOSIS — M546 Pain in thoracic spine: Secondary | ICD-10-CM | POA: Diagnosis not present

## 2020-11-24 DIAGNOSIS — M25652 Stiffness of left hip, not elsewhere classified: Secondary | ICD-10-CM | POA: Diagnosis not present

## 2020-11-24 NOTE — Therapy (Signed)
[facility] Outpatient Rehabilitation [facility] [address] [place], [place], [address] Phone: [phone]   Fax:  [phone]  Physical Therapy Treatment  Patient Details  Name: MAGDALENE TARDIFF MRN: [id number] Date of Birth: 04/09/1996 Referring Provider (PT): Foster Simpson, DO   Encounter Date: 11/24/2020   PT End of Session - 11/24/20 1217     Visit Number 3    Number of Visits 13    Date for PT Re-Evaluation 12/19/20    Authorization Type [facility] [employer]    PT Start Time 1217    PT Stop Time 1258    PT Time Calculation (min) 41 min    Activity Tolerance Patient tolerated treatment well;No increased pain    Behavior During Therapy [facility] for tasks assessed/performed             History reviewed. No pertinent past medical history.  Past Surgical History:  Procedure Laterality Date   DENTAL SURGERY      There were no vitals filed for this visit.   Subjective Assessment - 11/24/20 1220     Subjective Patient reports good compliance with HEP, no change in symptoms, "the plank is hard"    Currently in Pain? Yes    Pain Score 2     Pain Location Back    Pain Orientation Upper                [facility] PT Assessment - 11/24/20 0001       Assessment   Medical Diagnosis M54.6 Back pain, N62 Mamary hypertorphy, M54.2 Neck pain    Referring Provider (PT) Dillingham, Alan Ripper, DO      Precautions   Precautions None      Flexibility   Soft Tissue Assessment /Muscle Length yes    Hamstrings R=43deg, L=48dg from 90/90                           [facility] Adult PT Treatment/Exercise - 11/24/20 0001       Lumbar Exercises: Stretches   Passive Hamstring Stretch 30 seconds;2 reps    Passive Hamstring Stretch Limitations 1/2 long sit    Piriformis Stretch 30 seconds;2 reps    Other Lumbar Stretch Exercise Open book R/L x10ea      Lumbar Exercises: Standing   Scapular Retraction 10 reps    Theraband Level (Scapular Retraction)  Level 4 (Blue)    Scapular Retraction Limitations 5sec hold    Other Standing Lumbar Exercises Pallof Press BLUE x10 ea dir      Lumbar Exercises: Supine   Bridge 10 reps    Bridge Limitations B LEs on ball 2x10 followed by LTR    Other Supine Lumbar Exercises Grey foam along spine, supine horiz abd/add, alt flex/ext 2x10ea      Lumbar Exercises: Quadruped   Madcat/Old Horse 10 reps    Opposite Arm/Leg Raise 10 reps      Shoulder Exercises: Stretch   Table Stretch -Flexion Limitations seated Ball roll out FWD/R/L 3sec x 10ea dir                    PT Education - 11/24/20 1240     Education Details Discussed towel fold or roll to improve posture when sitting. Discussed proper body mechanics.    Person(s) Educated Patient    Methods Explanation    Comprehension Verbalized understanding              PT Short Term Goals -  11/16/20 1330       PT SHORT TERM GOAL #1   Title Patient will be independent with initial HEP for symptom management.    Baseline No exercise program    Time 2    Period Weeks    Status Achieved               PT Long Term Goals - 11/07/20 1604       PT LONG TERM GOAL #1   Title Patient will be independent with final HEP and progression to continue to reduce pain and symptoms after discharge.    Baseline No exercise program    Time 6    Period Weeks    Status New    Target Date 12/19/20      PT LONG TERM GOAL #2   Title Patient will demonstrate proper body mechanics for lifting 10# object from floor to table 10x with pain no more than 2/10 the next day to represent household chores.    Baseline Repeated heavy lifting causes increased delayed onset pain.    Time 6    Period Weeks    Status New    Target Date 12/19/20      PT LONG TERM GOAL #3   Title Patient will report no more than 1/10 pain throughout daily activity.    Baseline Constant minimum of 3/10 pain.    Time 6    Period Weeks    Status New    Target Date  12/19/20                   Plan - 11/24/20 1240     Clinical Impression Statement Patient tolerates progressed core standing and flexibility with ongoing discussion for posture. Patient continues with same discomfort/pain particularly with upper back, although improved flexibility.  Patient could benefit from continued skilled PT to address deficits and maximize functional daily activity with decreased pain.    Examination-Activity Limitations Bend;Lift    Examination-Participation Restrictions Community Activity;Personal Finances    PT Treatment/Interventions Functional mobility training;Therapeutic activities;Therapeutic exercise;Balance training;Electrical Stimulation;Moist Heat;Patient/family education;Manual techniques;Passive range of motion    PT Next Visit Plan Assess effectiveness of HEP and progress as appropriate, Manual at appropriate. Ys and Ts next visit.    PT Home Exercise Plan Access Code: [email or web address]    Consulted and Agree with Plan of Care Patient             Patient will benefit from skilled therapeutic intervention in order to improve the following deficits and impairments:  Decreased range of motion, Pain, Impaired flexibility  Visit Diagnosis: Chronic bilateral thoracic back pain  Joint stiffness of both hips     Problem List Patient Active Problem List   Diagnosis Date Noted   Symptomatic mammary hypertrophy 10/21/2020   Back pain 10/21/2020   Neck pain 10/21/2020    [name], PT 11/24/2020, 1:01 PM  [facility] [address] [place], [place], [address] Phone: [phone]   Fax:  [phone]  Name: TISHANA CLINKENBEARD MRN: [id number] Date of Birth: 1996/04/12

## 2020-11-29 ENCOUNTER — Ambulatory Visit: Payer: 59 | Attending: Plastic Surgery | Admitting: Physical Therapy

## 2020-11-29 ENCOUNTER — Encounter: Payer: Self-pay | Admitting: Physical Therapy

## 2020-11-29 ENCOUNTER — Other Ambulatory Visit: Payer: Self-pay

## 2020-11-29 DIAGNOSIS — M25651 Stiffness of right hip, not elsewhere classified: Secondary | ICD-10-CM

## 2020-11-29 DIAGNOSIS — M546 Pain in thoracic spine: Secondary | ICD-10-CM | POA: Insufficient documentation

## 2020-11-29 DIAGNOSIS — G8929 Other chronic pain: Secondary | ICD-10-CM

## 2020-11-29 DIAGNOSIS — M25652 Stiffness of left hip, not elsewhere classified: Secondary | ICD-10-CM | POA: Insufficient documentation

## 2020-11-29 NOTE — Patient Instructions (Addendum)
Sleeping on Back  Place pillow under knees. A pillow with cervical support and a roll around waist are also helpful. Copyright  VHI. All rights reserved.  Sleeping on Side Place pillow between knees. Use cervical support under neck and a roll around waist as needed. Copyright  VHI. All rights reserved.   Sleeping on Stomach   If this is the only desirable sleeping position, place pillow under lower legs, and under stomach or chest as needed.  Posture - Sitting   Sit upright, head facing forward. Try using a roll to support lower back. Keep shoulders relaxed, and avoid rounded back. Keep hips level with knees. Avoid crossing legs for long periods. Stand to Sit / Sit to Stand   To sit: Bend knees to lower self onto front edge of chair, then scoot back on seat. To stand: Reverse sequence by placing one foot forward, and scoot to front of seat. Use rocking motion to stand up.   Work Height and Reach  Ideal work height is no more than 2 to 4 inches below elbow level when standing, and at elbow level when sitting. Reaching should be limited to arm's length, with elbows slightly bent.  Bending  Bend at hips and knees, not back. Keep feet shoulder-width apart.    Posture - Standing   Good posture is important. Avoid slouching and forward head thrust. Maintain curve in low back and align ears over shoul- ders, hips over ankles.  Alternating Positions   Alternate tasks and change positions frequently to reduce fatigue and muscle tension. Take rest breaks. Computer Work   Position work to Art gallery manager. Use proper work and seat height. Keep shoulders back and down, wrists straight, and elbows at right angles. Use chair that provides full back support. Add footrest and lumbar roll as needed.  Getting Into / Out of Car  Lower self onto seat, scoot back, then bring in one leg at a time. Reverse sequence to get out.  Dressing  Lie on back to pull socks or slacks  over feet, or sit and bend leg while keeping back straight.    Housework - Sink  Place one foot on ledge of cabinet under sink when standing at sink for prolonged periods.   Pushing / Pulling  Pushing is preferable to pulling. Keep back in proper alignment, and use leg muscles to do the work.  Deep Squat   Squat and lift with both arms held against upper trunk. Tighten stomach muscles without holding breath. Use smooth movements to avoid jerking.  Avoid Twisting   Avoid twisting or bending back. Pivot around using foot movements, and bend at knees if needed when reaching for articles.  Carrying Luggage   Distribute weight evenly on both sides. Use a cart whenever possible. Do not twist trunk. Move body as a unit.   Lifting Principles Maintain proper posture and head alignment. Slide object as close as possible before lifting. Move obstacles out of the way. Test before lifting; ask for help if too heavy. Tighten stomach muscles without holding breath. Use smooth movements; do not jerk. Use legs to do the work, and pivot with feet. Distribute the work load symmetrically and close to the center of trunk. Push instead of pull whenever possible.   Ask For Help   Ask for help and delegate to others when possible. Coordinate your movements when lifting together, and maintain the low back curve.  Log Roll   Lying on  back, bend left knee and place left arm across chest. Roll all in one movement to the right. Reverse to roll to the left. Always move as one unit. Housework - Sweeping  Use long-handled equipment to avoid stooping.   Housework - Wiping  Position yourself as close as possible to reach work surface. Avoid straining your back.  Laundry - Unloading Wash   To unload small items at bottom of washer, lift leg opposite to arm being used to reach.  Gardening - Raking  Move close to area to be raked. Use arm movements to do the work. Keep back straight and  avoid twisting.     Cart  When reaching into cart with one arm, lift opposite leg to keep back straight.   Getting Into / Out of Bed  Lower self to lie down on one side by raising legs and lowering head at the same time. Use arms to assist moving without twisting. Bend both knees to roll onto back if desired. To sit up, start from lying on side, and use same move-ments in reverse. Housework - Vacuuming  Hold the vacuum with arm held at side. Step back and forth to move it, keeping head up. Avoid twisting.   Laundry - Armed forces training and education officer so that bending and twisting can be avoided.   Laundry - Unloading Dryer  Squat down to reach into clothes dryer or use a reacher.  Gardening - Weeding / Psychiatric nurse or Kneel. Knee pads may be helpful.                     Garen Lah, PT, ATRIC Certified Exercise Expert for the Aging Adult  11/29/20 10:46 AM Phone: [phone] Fax: [phone]

## 2020-11-29 NOTE — Therapy (Signed)
[facility] Outpatient Rehabilitation [facility] [address] [place], [place], [address] Phone: [phone]   Fax:  [phone]  Physical Therapy Treatment  Patient Details  Name: Amanda Gill MRN: [id number] Date of Birth: 1995/07/16 Referring Provider (PT): Foster Simpson, DO   Encounter Date: 11/29/2020   PT End of Session - 11/29/20 1100     Visit Number 4    Number of Visits 13    Date for PT Re-Evaluation 12/19/20    Authorization Type [facility]    PT Start Time 1025    PT Stop Time 1105    PT Time Calculation (min) 40 min    Activity Tolerance Patient tolerated treatment well;No increased pain    Behavior During Therapy [facility] for tasks assessed/performed             History reviewed. No pertinent past medical history.  Past Surgical History:  Procedure Laterality Date   DENTAL SURGERY      There were no vitals filed for this visit.   Subjective Assessment - 11/29/20 1026     Subjective My back pain is little more irritated today than normal.   I have been doing my HEP except for today.    Patient is accompained by: Family member    Patient Stated Goals Decreased pain.    Currently in Pain? Yes    Pain Score 3     Pain Location Back    Pain Orientation Upper    Pain Descriptors / Indicators Aching    Pain Type Chronic pain                               [facility] Adult PT Treatment/Exercise - 11/29/20 0001       Self-Care   Self-Care Lifting;ADL's;Posture    ADL's proper spine care with ADL's and went over handout    Lifting proper lifting technique verballzed and went over handout    Posture posture and spine relief in different positions and adherance to good posture      Lumbar Exercises: Stretches   Passive Hamstring Stretch 30 seconds;2 reps    Passive Hamstring Stretch Limitations 1/2 long sit    Piriformis Stretch 30 seconds;2 reps    Piriformis Stretch Limitations seated stretch    Other Lumbar  Stretch Exercise Open book R/L x10ea   squeeze knees for added mid thoracic stretch     Lumbar Exercises: Supine   Bridge 10 reps    Bridge Limitations B LEs on ball 2x10 followed by LTR      Lumbar Exercises: Prone   Other Prone Lumbar Exercises shoulder W's with more thoracic extension x 10 then Y's and T's 2 x 10 thumb toward wall and then toward ceiling for increased muscle fiber recrutiement      Lumbar Exercises: Quadruped   Madcat/Old Horse 10 reps      Shoulder Exercises: Stretch   Table Stretch -Flexion Limitations seated Ball roll out FWD/R/L 3sec x 10ea dir                    PT Education - 11/29/20 1046     Education Details added horizontal abd/diagonals combined in star pattern , with W's , T's and Y's in prone added to HEP and reinforced HEP    Person(s) Educated Patient    Methods Explanation;Demonstration;Tactile cues;Verbal cues;Handout    Comprehension Verbalized understanding;Returned demonstration  PT Short Term Goals - 11/29/20 1101       PT SHORT TERM GOAL #1   Title Patient will be independent with initial HEP for symptom management.    Time 2    Period Weeks    Status Achieved    Target Date 11/21/20      PT SHORT TERM GOAL #2   Title Patient to be instructed on proper body mechanics to reduce pain during functional tasks.    Baseline Pt able to verbalize lifting technique and use of written instructions to alleviate pressure in spine    Time 2    Period Weeks    Status On-going    Target Date 11/21/20               PT Long Term Goals - 11/29/20 1106       PT LONG TERM GOAL #1   Title Patient will be independent with final HEP and progression to continue to reduce pain and symptoms after discharge.    Baseline Pt given HEP to work on    Time 6    Period Weeks    Status On-going      PT LONG TERM GOAL #2   Title Patient will demonstrate proper body mechanics for lifting 10# object from floor to table 10x  with pain no more than 2/10 the next day to represent household chores.    Baseline Pt verbalized lifting instruction.  working on [employer]    Time 6    Period Weeks      PT LONG TERM GOAL #3   Title Patient will report no more than 1/10 pain throughout daily activity.    Baseline Constant minimum of 3/10 pain.    Time 6    Period Weeks    Status On-going             added to HEP  Access Code: [email or web address]: [email or web address]: 07/05/2022Prepared by: Tera Helper Notes  combine the horizontal abduction and diagonals into a star pattern to do all together  Exercises   Standing Shoulder Horizontal Abduction with Resistance - 1 x daily - 7 x weekly - 3 sets - 10 reps  Standing Shoulder Diagonal Horizontal Abduction 60/120 Degrees with Resistance - 1 x daily - 7 x weekly - 3 sets - 10 reps  Prone W Scapular Retraction - 1 x daily - 7 x weekly - 3 sets - 10 reps  Prone Lower Trapezius with Legs Straight on Swiss Ball - 1 x daily - 7 x weekly - 3 sets - 10 reps  Prone Shoulder Horizontal Abduction with Thumbs Up - 1 x daily - 7 x weekly - 3 sets - 10 reps    Plan - 11/29/20 1049     Clinical Impression Statement Pt initially presents to clininc with 3/10 pain and was able to reduce to 1/10 or less with exercise.  Pt educated on ADL. posture and lifting using handout.  PT added to HEP with w's , y's and t's in prone as well as shoulder pull aparts in horizontal abd and diagonals.   Pt is utililziing exercises for pain control and doing well with current exercise regimen.  all STG achieved and working toward completion of LTGs  cont POC    Personal Factors and Comorbidities Time since onset of injury/illness/exacerbation    Examination-Activity Limitations Bend;Lift    Examination-Participation Restrictions Community Activity;Personal Finances    PT Frequency 1x / week    PT Duration 6 weeks  PT Treatment/Interventions Functional mobility training;Therapeutic  activities;Therapeutic exercise;Balance training;Electrical Stimulation;Moist Heat;Patient/family education;Manual techniques;Passive range of motion    PT Next Visit Plan Assess effectiveness of HEP and progress as appropriate, Manual at appropriate. reinforced HEP    PT Home Exercise Plan Access Code: [email or web address]    Consulted and Agree with Plan of Care Patient             Patient will benefit from skilled therapeutic intervention in order to improve the following deficits and impairments:  Decreased range of motion, Pain, Impaired flexibility  Visit Diagnosis: Chronic bilateral thoracic back pain  Joint stiffness of both hips     Problem List Patient Active Problem List   Diagnosis Date Noted   Symptomatic mammary hypertrophy 10/21/2020   Back pain 10/21/2020   Neck pain 10/21/2020    Garen Lah, PT, ATRIC Certified Exercise Expert for the Aging Adult  11/29/20 11:18 AM Phone: [phone] Fax: [phone]   [facility] Outpatient Rehabilitation [facility] [address] [place], [place], [address] Phone: [phone]   Fax:  [phone]  Name: Amanda Gill MRN: [id number] Date of Birth: 1996-04-08

## 2020-11-30 ENCOUNTER — Encounter: Payer: 59 | Admitting: Physical Therapy

## 2020-12-05 DIAGNOSIS — F319 Bipolar disorder, unspecified: Secondary | ICD-10-CM | POA: Diagnosis not present

## 2020-12-05 DIAGNOSIS — F419 Anxiety disorder, unspecified: Secondary | ICD-10-CM | POA: Diagnosis not present

## 2020-12-07 ENCOUNTER — Ambulatory Visit: Payer: 59 | Admitting: Physical Therapy

## 2020-12-07 ENCOUNTER — Other Ambulatory Visit: Payer: Self-pay

## 2020-12-07 ENCOUNTER — Encounter: Payer: Self-pay | Admitting: Physical Therapy

## 2020-12-07 DIAGNOSIS — M25652 Stiffness of left hip, not elsewhere classified: Secondary | ICD-10-CM | POA: Diagnosis not present

## 2020-12-07 DIAGNOSIS — G8929 Other chronic pain: Secondary | ICD-10-CM

## 2020-12-07 DIAGNOSIS — M25651 Stiffness of right hip, not elsewhere classified: Secondary | ICD-10-CM | POA: Diagnosis not present

## 2020-12-07 DIAGNOSIS — M546 Pain in thoracic spine: Secondary | ICD-10-CM

## 2020-12-07 NOTE — Therapy (Signed)
[facility] [address] [place], [place], [address] Phone: [phone]   Fax:  [phone]  Physical Therapy Treatment  Patient Details  Name: SHEIKA COUTTS MRN: [id number] Date of Birth: 11-15-1995 Referring Provider (PT): Foster Simpson, DO   Encounter Date: 12/07/2020   PT End of Session - 12/07/20 1219     Visit Number 5    Number of Visits 13    Date for PT Re-Evaluation 12/19/20    Authorization Type [facility]    PT Start Time 1219    PT Stop Time 1258    PT Time Calculation (min) 39 min    Activity Tolerance Patient tolerated treatment well;No increased pain    Behavior During Therapy [facility] for tasks assessed/performed             History reviewed. No pertinent past medical history.  Past Surgical History:  Procedure Laterality Date   DENTAL SURGERY      There were no vitals filed for this visit.   Subjective Assessment - 12/07/20 1219     Subjective Patient reports good compliance with HEP, doing "a little better.    Currently in Pain? Yes    Pain Score 2     Pain Location Back    Pain Orientation Upper    Pain Descriptors / Indicators Aching    Pain Type Chronic pain                [facility] PT Assessment - 12/07/20 0001       Assessment   Medical Diagnosis M54.6 Back pain, N62 Mamary hypertorphy, M54.2 Neck pain    Referring Provider (PT) Dillingham, Alan Ripper, DO      Precautions   Precautions None                           [facility] Adult PT Treatment/Exercise - 12/07/20 0001       Neck Exercises: Machines for Strengthening   UBE (Upper Arm Bike) L2x23min seat=11      Lumbar Exercises: Stretches   Passive Hamstring Stretch 30 seconds;2 reps    Passive Hamstring Stretch Limitations 1/2 long sit    Piriformis Stretch 30 seconds;2 reps    Piriformis Stretch Limitations seated stretch      Lumbar Exercises: Standing   Scapular Retraction 10 reps    Theraband  Level (Scapular Retraction) Level 4 (Blue)    Scapular Retraction Limitations 5sec hold    Other Standing Lumbar Exercises Chest press BLUE standing on AIREX 2x10      Lumbar Exercises: Supine   Bridge 10 reps    Bridge Limitations B LEs on ball 2x10 followed by LTR    Other Supine Lumbar Exercises --                      PT Short Term Goals - 11/29/20 1101       PT SHORT TERM GOAL #1   Title Patient will be independent with initial HEP for symptom management.    Time 2    Period Weeks    Status Achieved    Target Date 11/21/20      PT SHORT TERM GOAL #2   Title Patient to be instructed on proper body mechanics to reduce pain during functional tasks.    Baseline Pt able to verbalize lifting technique and use of written instructions to alleviate pressure in spine    Time 2  Period Weeks    Status On-going    Target Date 11/21/20               PT Long Term Goals - 11/29/20 1106       PT LONG TERM GOAL #1   Title Patient will be independent with final HEP and progression to continue to reduce pain and symptoms after discharge.    Baseline Pt given HEP to work on    Time 6    Period Weeks    Status On-going      PT LONG TERM GOAL #2   Title Patient will demonstrate proper body mechanics for lifting 10# object from floor to table 10x with pain no more than 2/10 the next day to represent household chores.    Baseline Pt verbalized lifting instruction.  working on [employer]    Time 6    Period Weeks      PT LONG TERM GOAL #3   Title Patient will report no more than 1/10 pain throughout daily activity.    Baseline Constant minimum of 3/10 pain.    Time 6    Period Weeks    Status On-going                   Plan - 12/07/20 1254     Clinical Impression Statement Pt initially presents to clininc with low discomfort at 2/10 and states "roughly the same" with exercise. Consolodated HEP today with 4 exercises reduced to LOW priority and 2 moved to  4x/week.  Patient has made some progress with, pain control and doing well with current exercise regimen, overall continues with back pain during activity. Discussed making next visit discharge and refer back to MD.    Examination-Activity Limitations Bend;Lift    Examination-Participation Restrictions Community Activity;Personal Finances    PT Treatment/Interventions Functional mobility training;Therapeutic activities;Therapeutic exercise;Balance training;Electrical Stimulation;Moist Heat;Patient/family education;Manual techniques;Passive range of motion    PT Next Visit Plan DISCHARGE next visit.    PT Home Exercise Plan Access Code: [email or web address]    Consulted and Agree with Plan of Care Patient             Patient will benefit from skilled therapeutic intervention in order to improve the following deficits and impairments:  Decreased range of motion, Pain, Impaired flexibility  Visit Diagnosis: Chronic bilateral thoracic back pain  Joint stiffness of both hips     Problem List Patient Active Problem List   Diagnosis Date Noted   Symptomatic mammary hypertrophy 10/21/2020   Back pain 10/21/2020   Neck pain 10/21/2020    [name], PT 12/07/2020, 1:01 PM  [facility] [address] [place], [place], [address] Phone: [phone]   Fax:  [phone]  Name: SHAKISHA ABEND MRN: [id number] Date of Birth: 12/27/1995

## 2020-12-08 ENCOUNTER — Other Ambulatory Visit (HOSPITAL_COMMUNITY): Payer: Self-pay

## 2020-12-13 ENCOUNTER — Other Ambulatory Visit (HOSPITAL_COMMUNITY): Payer: Self-pay

## 2020-12-13 MED ORDER — VENLAFAXINE HCL ER 75 MG PO CP24
75.0000 mg | ORAL_CAPSULE | Freq: Every morning | ORAL | 2 refills | Status: DC
  Filled 2020-12-13: qty 30, 30d supply, fill #0

## 2020-12-14 ENCOUNTER — Encounter: Payer: Self-pay | Admitting: Physical Therapy

## 2020-12-14 ENCOUNTER — Other Ambulatory Visit: Payer: Self-pay

## 2020-12-14 ENCOUNTER — Ambulatory Visit: Payer: 59 | Admitting: Physical Therapy

## 2020-12-14 DIAGNOSIS — G8929 Other chronic pain: Secondary | ICD-10-CM | POA: Diagnosis not present

## 2020-12-14 DIAGNOSIS — M25651 Stiffness of right hip, not elsewhere classified: Secondary | ICD-10-CM

## 2020-12-14 DIAGNOSIS — M546 Pain in thoracic spine: Secondary | ICD-10-CM | POA: Diagnosis not present

## 2020-12-14 DIAGNOSIS — M25652 Stiffness of left hip, not elsewhere classified: Secondary | ICD-10-CM

## 2020-12-14 NOTE — Therapy (Signed)
[place], [place], [address] Phone: [phone]   Fax:  [phone]  Physical Therapy Treatment/DISCHARGE SUMMARY  Patient Details  Name: DEONDRA LABRADOR MRN: [id number] Date of Birth: [birth year] Referring Provider (PT): Audelia Hives, DO   Encounter Date: 12/14/2020   PT End of Session - 12/14/20 1217     Visit Number 6    Number of Visits 6    Date for PT Re-Evaluation 12/19/20    Authorization Type [facility]    PT Start Time 1218    PT Stop Time 2202    PT Time Calculation (min) 24 min    Activity Tolerance Patient tolerated treatment well;No increased pain    Behavior During Therapy [facility] for tasks assessed/performed             History reviewed. No pertinent past medical history.  Past Surgical History:  Procedure Laterality Date   DENTAL SURGERY      There were no vitals filed for this visit.   Subjective Assessment - 12/14/20 1222     Subjective Patient reports good compliance with HEP, not much change.    Pain Score 1     Pain Location Back    Pain Orientation Upper    Pain Descriptors / Indicators Aching    Pain Type Chronic pain                [facility] PT Assessment - 12/14/20 0001       Assessment   Medical Diagnosis M54.6 Back pain, N62 Mamary hypertorphy, M54.2 Neck pain    Referring Provider (PT) Dillingham, Lyndee Leo, DO      Precautions   Precautions None      Sensation   Light Touch Appears Intact      Strength   Right Shoulder Flexion 5/5    Right Shoulder ABduction 5/5    Left Shoulder Flexion 5/5    Left Shoulder ABduction 5/5    Right Hip Flexion 5/5    Left Hip Flexion 5/5      Flexibility   Hamstrings R=50 L=53      Palpation   Palpation comment TTP R/L thoracic paraspinals and PA glides R lower than left.                           [facility] Adult PT Treatment/Exercise - 12/14/20 0001       Lumbar Exercises: Stretches    Prone on Elbows Stretch 60 seconds;2 reps    Prone on Elbows Stretch Limitations Cervical retraction x10 between sets.    Other Lumbar Stretch Exercise Open book R/L x10ea      Lumbar Exercises: Fayne Mediate 20sec elbows/toes                    PT Education - 12/14/20 1232     Education Details Continue with current HEP and increase hamstring stretch frequency, add POE and POE with cervical retraction.    Person(s) Educated Patient    Methods Explanation;Demonstration    Comprehension Returned demonstration;Verbalized understanding              PT Short Term Goals - 12/14/20 1223       PT SHORT TERM GOAL #1   Title Patient will be independent with initial HEP for symptom management.    Status Achieved      PT SHORT TERM GOAL #2   Title Patient to be  instructed on proper body mechanics to reduce pain during functional tasks.    Status Achieved               PT Long Term Goals - 12/14/20 1224       PT LONG TERM GOAL #1   Title Patient will be independent with final HEP and progression to continue to reduce pain and symptoms after discharge.    Status Achieved      PT LONG TERM GOAL #2   Title Patient will demonstrate proper body mechanics for lifting 10# object from floor to table 10x with pain no more than 2/10 the next day to represent household chores.    Status Achieved      PT LONG TERM GOAL #3   Title Patient will report no more than 1/10 pain throughout daily activity.    Baseline Constant minimum of 3/10 pain.    Status Partially Met   Pain sometimes less, but continues to be 1-3/10 most of the time.                  Plan - 12/14/20 1252     Clinical Impression Statement Patient initially presented to clinic with constant 3/10 upper back and shoulder pain, presents to clininc today with low discomfort at 1/10 but states "roughly the same" with exercise. Consolodated HEP last visit with 4 exercises reduced to LOW priority and  2 moved to 4x/week and addition of POE for releif of upper back musculature. Patient has made some progress with pain control and core stability doing well with current exercise regimen, overall continues with back pain during activity. Patient has completed program meeting all short term and long term goals except for pain goal.  Patient will be discharged and referred back to MD for further assessment and treatment alternatives.    Examination-Activity Limitations Bend;Lift    PT Treatment/Interventions Functional mobility training;Therapeutic activities;Therapeutic exercise;Balance training;Electrical Stimulation;Moist Heat;Patient/family education;Manual techniques;Passive range of motion    PT Next Visit Plan DISCHARGE    PT Home Exercise Plan Access Code: [email or web address]    Consulted and Agree with Plan of Care Patient             Patient will benefit from skilled therapeutic intervention in order to improve the following deficits and impairments:  Decreased range of motion, Pain, Impaired flexibility  Visit Diagnosis: Joint stiffness of both hips  Chronic bilateral thoracic back pain     Problem List Patient Active Problem List   Diagnosis Date Noted   Symptomatic mammary hypertrophy 10/21/2020   Back pain 10/21/2020   Neck pain 10/21/2020    Pollyann Samples, PT 12/14/2020, 12:59 PM  [facility] [address] [place], [place], [address] Phone: [phone]   Fax:  [phone]  Name: LADINA SHUTTERS MRN: [id number] Date of Birth: [birth year]  PHYSICAL THERAPY DISCHARGE SUMMARY  Visits from Start of Care: 6  Current functional level related to goals / functional outcomes: Continues with community mobility with pain.     Remaining deficits: Upper back/shoulder pain with activity.   Education / Equipment: HEP   Patient agrees to discharge. Patient goals were partially met. Patient is being discharged due to meeting  the stated rehab goals. Except for continued pain.    Pollyann Samples, PT

## 2020-12-22 DIAGNOSIS — M25552 Pain in left hip: Secondary | ICD-10-CM | POA: Diagnosis not present

## 2020-12-22 DIAGNOSIS — Z9889 Other specified postprocedural states: Secondary | ICD-10-CM | POA: Diagnosis not present

## 2020-12-22 DIAGNOSIS — M25551 Pain in right hip: Secondary | ICD-10-CM | POA: Diagnosis not present

## 2021-01-05 ENCOUNTER — Other Ambulatory Visit (HOSPITAL_COMMUNITY): Payer: Self-pay

## 2021-01-05 DIAGNOSIS — F319 Bipolar disorder, unspecified: Secondary | ICD-10-CM | POA: Diagnosis not present

## 2021-01-05 DIAGNOSIS — F419 Anxiety disorder, unspecified: Secondary | ICD-10-CM | POA: Diagnosis not present

## 2021-01-05 MED ORDER — VENLAFAXINE HCL ER 37.5 MG PO CP24
ORAL_CAPSULE | ORAL | 0 refills | Status: DC
  Filled 2021-01-05: qty 30, 30d supply, fill #0

## 2021-01-05 MED ORDER — LATUDA 40 MG PO TABS
ORAL_TABLET | ORAL | 2 refills | Status: DC
  Filled 2021-01-05: qty 30, 30d supply, fill #0

## 2021-01-07 ENCOUNTER — Other Ambulatory Visit (HOSPITAL_COMMUNITY): Payer: Self-pay

## 2021-01-10 ENCOUNTER — Other Ambulatory Visit (HOSPITAL_COMMUNITY): Payer: Self-pay

## 2021-01-18 ENCOUNTER — Other Ambulatory Visit: Payer: Self-pay

## 2021-01-18 ENCOUNTER — Encounter: Payer: Self-pay | Admitting: Surgical

## 2021-01-18 ENCOUNTER — Ambulatory Visit (INDEPENDENT_AMBULATORY_CARE_PROVIDER_SITE_OTHER): Payer: 59 | Admitting: Surgical

## 2021-01-18 VITALS — Wt 233.2 lb

## 2021-01-18 DIAGNOSIS — N62 Hypertrophy of breast: Secondary | ICD-10-CM

## 2021-01-18 DIAGNOSIS — G8929 Other chronic pain: Secondary | ICD-10-CM

## 2021-01-18 DIAGNOSIS — M546 Pain in thoracic spine: Secondary | ICD-10-CM | POA: Diagnosis not present

## 2021-01-18 DIAGNOSIS — M542 Cervicalgia: Secondary | ICD-10-CM

## 2021-01-18 NOTE — Progress Notes (Signed)
   Referring Provider Marinda Elk, MD [address] [place],  [place] [address]   CC:  Chief Complaint  Patient presents with   Follow-up      Amanda Gill is an 25 y.o. female.  HPI: Patient is a 25 y.o. year old female here for follow up after completing physical therapy for pain related to macromastia. She reports that physical therapy did not provide much relief.  She reports that she had 6 or 7 visits.  She reports that she is still having daily upper mid back pain.  She reports she is still interested in pursuing surgical intervention.    She reports that she does get frequent rashes beneath the folds of her breast, reports these are fungal in nature.  She has had ketoconazole cream and p.o. antifungals for management of fungal rashes.  She reports that this works well, however after completing the course she develops additional rash.  She is currently in law school and reports that coordinating class schedule and lifting restrictions may be difficult but she is going to look into this.  She has lost some weight since her last appointment, she is currently 233.2 pounds today and in May 2022 she was 236 pounds.  Review of Systems General: Positive back pain  Physical Exam Vitals with BMI 01/18/2021 10/21/2020 11/07/2018  Height - 5\' 9"  -  Weight 233 lbs 3 oz 236 lbs -  BMI - 34.84 -  Systolic - 118 104  Diastolic - 79 71  Pulse - 91 89    General:  No acute distress,  Alert and oriented, Non-Toxic, Normal speech and affect Psych: Normal behavior and mood  Assessment/Plan Patient is interested in pursuing surgical intervention for bilateral breast reduction. Patient has completed at least 6 weeks of physical therapy for pain related to macromastia.  Discussed with patient we would submit to insurance for authorization, discussed approval could take up to 6 weeks.   Recommend calling with questions or concerns.  Amanda Gill 01/18/2021, 3:45 PM

## 2021-02-07 ENCOUNTER — Other Ambulatory Visit (HOSPITAL_COMMUNITY): Payer: Self-pay

## 2021-02-07 DIAGNOSIS — F419 Anxiety disorder, unspecified: Secondary | ICD-10-CM | POA: Diagnosis not present

## 2021-02-07 DIAGNOSIS — F319 Bipolar disorder, unspecified: Secondary | ICD-10-CM | POA: Diagnosis not present

## 2021-02-07 MED ORDER — LATUDA 40 MG PO TABS
ORAL_TABLET | ORAL | 2 refills | Status: DC
  Filled 2021-02-07: qty 30, 30d supply, fill #0

## 2021-02-24 ENCOUNTER — Other Ambulatory Visit (HOSPITAL_COMMUNITY): Payer: Self-pay

## 2021-02-24 DIAGNOSIS — Z0000 Encounter for general adult medical examination without abnormal findings: Secondary | ICD-10-CM | POA: Diagnosis not present

## 2021-02-24 DIAGNOSIS — Z131 Encounter for screening for diabetes mellitus: Secondary | ICD-10-CM | POA: Diagnosis not present

## 2021-02-24 DIAGNOSIS — Z13220 Encounter for screening for lipoid disorders: Secondary | ICD-10-CM | POA: Diagnosis not present

## 2021-02-24 DIAGNOSIS — R32 Unspecified urinary incontinence: Secondary | ICD-10-CM | POA: Diagnosis not present

## 2021-02-24 MED ORDER — NIFEDIPINE ER 60 MG PO TB24
60.0000 mg | ORAL_TABLET | Freq: Every day | ORAL | 0 refills | Status: DC
  Filled 2021-02-24: qty 90, 90d supply, fill #0

## 2021-03-03 ENCOUNTER — Other Ambulatory Visit (HOSPITAL_COMMUNITY): Payer: Self-pay

## 2021-03-09 ENCOUNTER — Other Ambulatory Visit (HOSPITAL_COMMUNITY): Payer: Self-pay

## 2021-03-09 DIAGNOSIS — F319 Bipolar disorder, unspecified: Secondary | ICD-10-CM | POA: Diagnosis not present

## 2021-03-09 DIAGNOSIS — F419 Anxiety disorder, unspecified: Secondary | ICD-10-CM | POA: Diagnosis not present

## 2021-03-09 MED ORDER — LATUDA 40 MG PO TABS
ORAL_TABLET | ORAL | 2 refills | Status: DC
  Filled 2021-03-09: qty 30, 30d supply, fill #0
  Filled 2021-04-08: qty 30, 30d supply, fill #1
  Filled 2021-05-06: qty 30, 30d supply, fill #2

## 2021-04-08 ENCOUNTER — Other Ambulatory Visit (HOSPITAL_COMMUNITY): Payer: Self-pay

## 2021-05-08 ENCOUNTER — Other Ambulatory Visit (HOSPITAL_COMMUNITY): Payer: Self-pay

## 2021-06-08 ENCOUNTER — Other Ambulatory Visit (HOSPITAL_COMMUNITY): Payer: Self-pay

## 2021-06-08 DIAGNOSIS — F319 Bipolar disorder, unspecified: Secondary | ICD-10-CM | POA: Diagnosis not present

## 2021-06-08 DIAGNOSIS — F419 Anxiety disorder, unspecified: Secondary | ICD-10-CM | POA: Diagnosis not present

## 2021-06-08 MED ORDER — LATUDA 40 MG PO TABS
ORAL_TABLET | ORAL | 2 refills | Status: DC
  Filled 2021-06-08: qty 30, 30d supply, fill #0
  Filled 2021-07-06: qty 30, 30d supply, fill #1
  Filled 2021-08-06: qty 30, 30d supply, fill #2

## 2021-06-10 ENCOUNTER — Other Ambulatory Visit (HOSPITAL_COMMUNITY): Payer: Self-pay

## 2021-06-12 ENCOUNTER — Other Ambulatory Visit (HOSPITAL_COMMUNITY): Payer: Self-pay

## 2021-06-12 MED ORDER — NIFEDIPINE ER 60 MG PO TB24
ORAL_TABLET | ORAL | 0 refills | Status: DC
  Filled 2021-06-12: qty 90, 90d supply, fill #0

## 2021-06-13 ENCOUNTER — Other Ambulatory Visit (HOSPITAL_COMMUNITY): Payer: Self-pay

## 2021-06-15 ENCOUNTER — Other Ambulatory Visit (HOSPITAL_COMMUNITY): Payer: Self-pay

## 2021-07-07 ENCOUNTER — Other Ambulatory Visit (HOSPITAL_COMMUNITY): Payer: Self-pay

## 2021-07-28 DIAGNOSIS — Z01419 Encounter for gynecological examination (general) (routine) without abnormal findings: Secondary | ICD-10-CM | POA: Diagnosis not present

## 2021-08-04 ENCOUNTER — Other Ambulatory Visit (HOSPITAL_COMMUNITY): Payer: Self-pay

## 2021-08-04 DIAGNOSIS — J029 Acute pharyngitis, unspecified: Secondary | ICD-10-CM | POA: Diagnosis not present

## 2021-08-04 DIAGNOSIS — J069 Acute upper respiratory infection, unspecified: Secondary | ICD-10-CM | POA: Diagnosis not present

## 2021-08-04 MED ORDER — IPRATROPIUM BROMIDE 0.06 % NA SOLN
NASAL | 0 refills | Status: DC
  Filled 2021-08-04: qty 15, 13d supply, fill #0

## 2021-08-04 MED ORDER — LAGEVRIO 200 MG PO CAPS
ORAL_CAPSULE | ORAL | 0 refills | Status: DC
  Filled 2021-08-04: qty 40, 5d supply, fill #0

## 2021-08-07 ENCOUNTER — Other Ambulatory Visit (HOSPITAL_COMMUNITY): Payer: Self-pay

## 2021-08-11 ENCOUNTER — Other Ambulatory Visit: Payer: Self-pay

## 2021-08-11 ENCOUNTER — Ambulatory Visit (INDEPENDENT_AMBULATORY_CARE_PROVIDER_SITE_OTHER): Payer: 59 | Admitting: Podiatry

## 2021-08-11 DIAGNOSIS — L608 Other nail disorders: Secondary | ICD-10-CM | POA: Diagnosis not present

## 2021-08-11 DIAGNOSIS — L603 Nail dystrophy: Secondary | ICD-10-CM

## 2021-08-11 DIAGNOSIS — B351 Tinea unguium: Secondary | ICD-10-CM | POA: Diagnosis not present

## 2021-08-11 NOTE — Progress Notes (Signed)
Subjective: ?26 year old female presents the office today for evaluation of her left big toenail not growing out much and also becoming thick and discolored.  She is already taking biotin.  She uses the urea gel intermittently.  Occasional discomfort at the top of the nail but denies any swelling or redness or any drainage.  No recent injury.  She is good to have ingrown toenail procedures. ? ?Objective: ?AAO x3, NAD ?DP/PT pulses palpable bilaterally, CRT less than 3 seconds ?Left hallux nail is hypertrophic, dystrophic although has only grown out a small amount.  The nail is loose with underlying nailbed distally but from adhered proximally.  Slight discomfort upon palpation of the nail.  There is no edema, erythema, drainage or pus or any signs of infection. ?No pain with calf compression, swelling, warmth, erythema ? ?Assessment: ?Onychodystrophy ? ?Plan: ?-All treatment options discussed with the patient including all alternatives, risks, complications.  ?-Previous nail culture result reviewed with her.  At this point the nails not growing out much I discussed toenail removal but we ultimately held off on this today.  She can continue with biotin and I have taken a new culture of the toenail.  I sharply debrided the nail with any complications or bleeding I sent this to [facility].  ?-Patient encouraged to call the office with any questions, concerns, change in symptoms.  ? ?Trula Slade DPM ? ?

## 2021-08-14 DIAGNOSIS — U071 COVID-19: Secondary | ICD-10-CM | POA: Diagnosis not present

## 2021-08-25 ENCOUNTER — Other Ambulatory Visit: Payer: Self-pay | Admitting: Podiatry

## 2021-08-25 MED ORDER — EFINACONAZOLE 10 % EX SOLN: 1.0000 [drp] | Freq: Every day | CUTANEOUS | 11 refills | Status: DC

## 2021-08-31 ENCOUNTER — Other Ambulatory Visit (HOSPITAL_COMMUNITY): Payer: Self-pay

## 2021-08-31 DIAGNOSIS — F419 Anxiety disorder, unspecified: Secondary | ICD-10-CM | POA: Diagnosis not present

## 2021-08-31 DIAGNOSIS — Z9889 Other specified postprocedural states: Secondary | ICD-10-CM | POA: Diagnosis not present

## 2021-08-31 DIAGNOSIS — F319 Bipolar disorder, unspecified: Secondary | ICD-10-CM | POA: Diagnosis not present

## 2021-08-31 DIAGNOSIS — M76891 Other specified enthesopathies of right lower limb, excluding foot: Secondary | ICD-10-CM | POA: Diagnosis not present

## 2021-08-31 MED ORDER — LURASIDONE HCL 40 MG PO TABS
ORAL_TABLET | ORAL | 2 refills | Status: DC
  Filled 2021-08-31: qty 30, 30d supply, fill #0

## 2021-09-01 ENCOUNTER — Other Ambulatory Visit (HOSPITAL_COMMUNITY): Payer: Self-pay

## 2021-09-04 ENCOUNTER — Other Ambulatory Visit (HOSPITAL_COMMUNITY): Payer: Self-pay

## 2021-09-04 DIAGNOSIS — I7300 Raynaud's syndrome without gangrene: Secondary | ICD-10-CM | POA: Diagnosis not present

## 2021-09-04 DIAGNOSIS — K130 Diseases of lips: Secondary | ICD-10-CM | POA: Diagnosis not present

## 2021-09-04 DIAGNOSIS — R32 Unspecified urinary incontinence: Secondary | ICD-10-CM | POA: Diagnosis not present

## 2021-09-04 MED ORDER — LURASIDONE HCL 60 MG PO TABS
60.0000 mg | ORAL_TABLET | Freq: Every evening | ORAL | 0 refills | Status: DC
  Filled 2021-09-04: qty 30, 30d supply, fill #0

## 2021-09-04 MED ORDER — TRIAMCINOLONE ACETONIDE 0.1 % EX LOTN
TOPICAL_LOTION | CUTANEOUS | 0 refills | Status: DC
  Filled 2021-09-04: qty 60, 14d supply, fill #0

## 2021-09-04 MED ORDER — NIFEDIPINE ER 60 MG PO TB24
60.0000 mg | ORAL_TABLET | Freq: Every day | ORAL | 3 refills | Status: DC
  Filled 2021-09-04: qty 90, 90d supply, fill #0
  Filled 2021-11-29: qty 90, 90d supply, fill #1
  Filled 2022-04-03: qty 90, 90d supply, fill #2
  Filled 2022-07-09: qty 90, 90d supply, fill #3

## 2021-09-05 ENCOUNTER — Other Ambulatory Visit (HOSPITAL_COMMUNITY): Payer: Self-pay

## 2021-09-22 DIAGNOSIS — Z9889 Other specified postprocedural states: Secondary | ICD-10-CM | POA: Diagnosis not present

## 2021-09-22 DIAGNOSIS — S73191A Other sprain of right hip, initial encounter: Secondary | ICD-10-CM | POA: Diagnosis not present

## 2021-09-25 ENCOUNTER — Other Ambulatory Visit (HOSPITAL_COMMUNITY): Payer: Self-pay

## 2021-09-25 DIAGNOSIS — F419 Anxiety disorder, unspecified: Secondary | ICD-10-CM | POA: Diagnosis not present

## 2021-09-25 DIAGNOSIS — F319 Bipolar disorder, unspecified: Secondary | ICD-10-CM | POA: Diagnosis not present

## 2021-09-25 MED ORDER — LURASIDONE HCL 60 MG PO TABS
ORAL_TABLET | ORAL | 2 refills | Status: DC
  Filled 2021-09-25 – 2021-09-27 (×2): qty 30, 30d supply, fill #0
  Filled 2021-10-30: qty 30, 30d supply, fill #1

## 2021-09-26 ENCOUNTER — Other Ambulatory Visit (HOSPITAL_COMMUNITY): Payer: Self-pay

## 2021-09-27 ENCOUNTER — Other Ambulatory Visit (HOSPITAL_COMMUNITY): Payer: Self-pay

## 2021-09-28 ENCOUNTER — Other Ambulatory Visit (HOSPITAL_COMMUNITY): Payer: Self-pay

## 2021-09-29 ENCOUNTER — Other Ambulatory Visit (HOSPITAL_COMMUNITY): Payer: Self-pay

## 2021-09-30 ENCOUNTER — Other Ambulatory Visit (HOSPITAL_COMMUNITY): Payer: Self-pay

## 2021-10-03 ENCOUNTER — Other Ambulatory Visit (HOSPITAL_COMMUNITY): Payer: Self-pay

## 2021-10-03 DIAGNOSIS — H5213 Myopia, bilateral: Secondary | ICD-10-CM | POA: Diagnosis not present

## 2021-10-03 DIAGNOSIS — Z135 Encounter for screening for eye and ear disorders: Secondary | ICD-10-CM | POA: Diagnosis not present

## 2021-10-04 ENCOUNTER — Other Ambulatory Visit (HOSPITAL_COMMUNITY): Payer: Self-pay

## 2021-10-09 ENCOUNTER — Other Ambulatory Visit (HOSPITAL_COMMUNITY): Payer: Self-pay

## 2021-10-25 ENCOUNTER — Other Ambulatory Visit (HOSPITAL_COMMUNITY): Payer: Self-pay

## 2021-10-27 DIAGNOSIS — N3946 Mixed incontinence: Secondary | ICD-10-CM | POA: Diagnosis not present

## 2021-10-27 DIAGNOSIS — R351 Nocturia: Secondary | ICD-10-CM | POA: Diagnosis not present

## 2021-10-27 DIAGNOSIS — R350 Frequency of micturition: Secondary | ICD-10-CM | POA: Diagnosis not present

## 2021-10-31 ENCOUNTER — Other Ambulatory Visit (HOSPITAL_COMMUNITY): Payer: Self-pay

## 2021-11-10 DIAGNOSIS — N3946 Mixed incontinence: Secondary | ICD-10-CM | POA: Diagnosis not present

## 2021-11-17 DIAGNOSIS — N3946 Mixed incontinence: Secondary | ICD-10-CM | POA: Diagnosis not present

## 2021-11-17 DIAGNOSIS — R351 Nocturia: Secondary | ICD-10-CM | POA: Diagnosis not present

## 2021-11-22 ENCOUNTER — Other Ambulatory Visit (HOSPITAL_COMMUNITY): Payer: Self-pay

## 2021-11-22 MED ORDER — LURASIDONE HCL 40 MG PO TABS
40.0000 mg | ORAL_TABLET | Freq: Every evening | ORAL | 0 refills | Status: DC
  Filled 2021-11-22 (×2): qty 30, 30d supply, fill #0

## 2021-11-23 ENCOUNTER — Other Ambulatory Visit (HOSPITAL_COMMUNITY): Payer: Self-pay

## 2021-11-24 ENCOUNTER — Other Ambulatory Visit (HOSPITAL_COMMUNITY): Payer: Self-pay

## 2021-11-29 ENCOUNTER — Other Ambulatory Visit (HOSPITAL_COMMUNITY): Payer: Self-pay

## 2021-12-01 DIAGNOSIS — M24151 Other articular cartilage disorders, right hip: Secondary | ICD-10-CM | POA: Diagnosis not present

## 2021-12-04 ENCOUNTER — Other Ambulatory Visit (HOSPITAL_COMMUNITY): Payer: Self-pay

## 2021-12-04 DIAGNOSIS — F319 Bipolar disorder, unspecified: Secondary | ICD-10-CM | POA: Diagnosis not present

## 2021-12-04 DIAGNOSIS — F419 Anxiety disorder, unspecified: Secondary | ICD-10-CM | POA: Diagnosis not present

## 2021-12-04 MED ORDER — LURASIDONE HCL 40 MG PO TABS
ORAL_TABLET | ORAL | 2 refills | Status: DC
  Filled 2021-12-04 – 2021-12-19 (×2): qty 30, 30d supply, fill #0

## 2021-12-04 MED ORDER — MIRABEGRON ER 50 MG PO TB24
ORAL_TABLET | ORAL | 11 refills | Status: DC
  Filled 2021-12-04: qty 30, 30d supply, fill #0

## 2021-12-04 MED ORDER — TOPIRAMATE 25 MG PO TABS
ORAL_TABLET | ORAL | 0 refills | Status: DC
  Filled 2021-12-04: qty 42, 28d supply, fill #0

## 2021-12-20 ENCOUNTER — Other Ambulatory Visit (HOSPITAL_COMMUNITY): Payer: Self-pay

## 2021-12-29 DIAGNOSIS — M25651 Stiffness of right hip, not elsewhere classified: Secondary | ICD-10-CM | POA: Diagnosis not present

## 2021-12-29 DIAGNOSIS — R531 Weakness: Secondary | ICD-10-CM | POA: Diagnosis not present

## 2021-12-29 DIAGNOSIS — M25551 Pain in right hip: Secondary | ICD-10-CM | POA: Diagnosis not present

## 2022-01-01 ENCOUNTER — Other Ambulatory Visit (HOSPITAL_COMMUNITY): Payer: Self-pay

## 2022-01-01 MED ORDER — TOPIRAMATE 25 MG PO TABS
ORAL_TABLET | ORAL | 2 refills | Status: DC
Start: 2022-01-01 — End: 2022-07-31
  Filled 2022-01-01: qty 60, 30d supply, fill #0
  Filled 2022-02-17: qty 60, 30d supply, fill #1

## 2022-01-02 ENCOUNTER — Other Ambulatory Visit (HOSPITAL_COMMUNITY): Payer: Self-pay

## 2022-01-04 DIAGNOSIS — F319 Bipolar disorder, unspecified: Secondary | ICD-10-CM | POA: Diagnosis not present

## 2022-01-04 DIAGNOSIS — F419 Anxiety disorder, unspecified: Secondary | ICD-10-CM | POA: Diagnosis not present

## 2022-01-18 DIAGNOSIS — M25651 Stiffness of right hip, not elsewhere classified: Secondary | ICD-10-CM | POA: Diagnosis not present

## 2022-01-18 DIAGNOSIS — R531 Weakness: Secondary | ICD-10-CM | POA: Diagnosis not present

## 2022-01-18 DIAGNOSIS — M25551 Pain in right hip: Secondary | ICD-10-CM | POA: Diagnosis not present

## 2022-01-24 DIAGNOSIS — M25651 Stiffness of right hip, not elsewhere classified: Secondary | ICD-10-CM | POA: Diagnosis not present

## 2022-01-24 DIAGNOSIS — M25551 Pain in right hip: Secondary | ICD-10-CM | POA: Diagnosis not present

## 2022-01-24 DIAGNOSIS — R531 Weakness: Secondary | ICD-10-CM | POA: Diagnosis not present

## 2022-01-31 DIAGNOSIS — R531 Weakness: Secondary | ICD-10-CM | POA: Diagnosis not present

## 2022-01-31 DIAGNOSIS — M25551 Pain in right hip: Secondary | ICD-10-CM | POA: Diagnosis not present

## 2022-01-31 DIAGNOSIS — M25651 Stiffness of right hip, not elsewhere classified: Secondary | ICD-10-CM | POA: Diagnosis not present

## 2022-02-01 DIAGNOSIS — F319 Bipolar disorder, unspecified: Secondary | ICD-10-CM | POA: Diagnosis not present

## 2022-02-01 DIAGNOSIS — F419 Anxiety disorder, unspecified: Secondary | ICD-10-CM | POA: Diagnosis not present

## 2022-02-07 DIAGNOSIS — R531 Weakness: Secondary | ICD-10-CM | POA: Diagnosis not present

## 2022-02-07 DIAGNOSIS — M25651 Stiffness of right hip, not elsewhere classified: Secondary | ICD-10-CM | POA: Diagnosis not present

## 2022-02-07 DIAGNOSIS — M25551 Pain in right hip: Secondary | ICD-10-CM | POA: Diagnosis not present

## 2022-02-12 ENCOUNTER — Other Ambulatory Visit (HOSPITAL_COMMUNITY): Payer: Self-pay

## 2022-02-12 ENCOUNTER — Other Ambulatory Visit (HOSPITAL_BASED_OUTPATIENT_CLINIC_OR_DEPARTMENT_OTHER): Payer: Self-pay

## 2022-02-12 MED ORDER — MYRBETRIQ 50 MG PO TB24
50.0000 mg | ORAL_TABLET | Freq: Every day | ORAL | 2 refills | Status: DC
  Filled 2022-02-12: qty 90, 90d supply, fill #0
  Filled 2022-05-11: qty 90, 90d supply, fill #1
  Filled 2022-08-10: qty 90, 90d supply, fill #2

## 2022-02-13 ENCOUNTER — Other Ambulatory Visit (HOSPITAL_COMMUNITY): Payer: Self-pay

## 2022-02-14 DIAGNOSIS — M25651 Stiffness of right hip, not elsewhere classified: Secondary | ICD-10-CM | POA: Diagnosis not present

## 2022-02-14 DIAGNOSIS — M25551 Pain in right hip: Secondary | ICD-10-CM | POA: Diagnosis not present

## 2022-02-14 DIAGNOSIS — R531 Weakness: Secondary | ICD-10-CM | POA: Diagnosis not present

## 2022-02-16 ENCOUNTER — Other Ambulatory Visit (HOSPITAL_COMMUNITY): Payer: Self-pay

## 2022-02-17 ENCOUNTER — Other Ambulatory Visit (HOSPITAL_COMMUNITY): Payer: Self-pay

## 2022-02-17 MED ORDER — LURASIDONE HCL 40 MG PO TABS
40.0000 mg | ORAL_TABLET | Freq: Every day | ORAL | 1 refills | Status: DC
  Filled 2022-02-17: qty 30, 30d supply, fill #0
  Filled 2022-03-19: qty 30, 30d supply, fill #1

## 2022-02-19 ENCOUNTER — Other Ambulatory Visit (HOSPITAL_COMMUNITY): Payer: Self-pay

## 2022-02-21 ENCOUNTER — Other Ambulatory Visit (HOSPITAL_COMMUNITY): Payer: Self-pay

## 2022-02-28 DIAGNOSIS — M25551 Pain in right hip: Secondary | ICD-10-CM | POA: Diagnosis not present

## 2022-02-28 DIAGNOSIS — R531 Weakness: Secondary | ICD-10-CM | POA: Diagnosis not present

## 2022-02-28 DIAGNOSIS — M25651 Stiffness of right hip, not elsewhere classified: Secondary | ICD-10-CM | POA: Diagnosis not present

## 2022-03-02 ENCOUNTER — Other Ambulatory Visit (HOSPITAL_COMMUNITY): Payer: Self-pay

## 2022-03-02 DIAGNOSIS — F319 Bipolar disorder, unspecified: Secondary | ICD-10-CM | POA: Diagnosis not present

## 2022-03-02 DIAGNOSIS — F419 Anxiety disorder, unspecified: Secondary | ICD-10-CM | POA: Diagnosis not present

## 2022-03-02 MED ORDER — BUPROPION HCL ER (XL) 300 MG PO TB24
300.0000 mg | ORAL_TABLET | Freq: Every morning | ORAL | 0 refills | Status: DC
  Filled 2022-03-02: qty 90, 90d supply, fill #0

## 2022-03-20 ENCOUNTER — Other Ambulatory Visit (HOSPITAL_COMMUNITY): Payer: Self-pay

## 2022-03-26 DIAGNOSIS — M24151 Other articular cartilage disorders, right hip: Secondary | ICD-10-CM | POA: Diagnosis not present

## 2022-04-02 ENCOUNTER — Other Ambulatory Visit (HOSPITAL_COMMUNITY): Payer: Self-pay

## 2022-04-02 DIAGNOSIS — F319 Bipolar disorder, unspecified: Secondary | ICD-10-CM | POA: Diagnosis not present

## 2022-04-02 DIAGNOSIS — F419 Anxiety disorder, unspecified: Secondary | ICD-10-CM | POA: Diagnosis not present

## 2022-04-02 MED ORDER — TOPIRAMATE 25 MG PO TABS
50.0000 mg | ORAL_TABLET | Freq: Every day | ORAL | 0 refills | Status: DC
  Filled 2022-04-02: qty 120, 60d supply, fill #0

## 2022-04-02 MED ORDER — LURASIDONE HCL 40 MG PO TABS
40.0000 mg | ORAL_TABLET | Freq: Every day | ORAL | 0 refills | Status: DC
Start: 2022-04-02 — End: 2022-12-17
  Filled 2022-04-18: qty 90, 90d supply, fill #0

## 2022-04-03 ENCOUNTER — Other Ambulatory Visit (HOSPITAL_COMMUNITY): Payer: Self-pay

## 2022-04-04 ENCOUNTER — Other Ambulatory Visit (HOSPITAL_COMMUNITY): Payer: Self-pay

## 2022-04-12 ENCOUNTER — Other Ambulatory Visit (HOSPITAL_COMMUNITY): Payer: Self-pay

## 2022-04-12 MED ORDER — TRAMADOL HCL 50 MG PO TABS
50.0000 mg | ORAL_TABLET | Freq: Three times a day (TID) | ORAL | 0 refills | Status: DC | PRN
  Filled 2022-04-12: qty 30, 10d supply, fill #0

## 2022-04-18 DIAGNOSIS — J029 Acute pharyngitis, unspecified: Secondary | ICD-10-CM | POA: Diagnosis not present

## 2022-04-18 DIAGNOSIS — R590 Localized enlarged lymph nodes: Secondary | ICD-10-CM | POA: Diagnosis not present

## 2022-04-19 ENCOUNTER — Other Ambulatory Visit (HOSPITAL_COMMUNITY): Payer: Self-pay

## 2022-04-20 ENCOUNTER — Other Ambulatory Visit (HOSPITAL_COMMUNITY): Payer: Self-pay

## 2022-05-01 DIAGNOSIS — R209 Unspecified disturbances of skin sensation: Secondary | ICD-10-CM | POA: Diagnosis not present

## 2022-05-01 DIAGNOSIS — Z0000 Encounter for general adult medical examination without abnormal findings: Secondary | ICD-10-CM | POA: Diagnosis not present

## 2022-05-02 DIAGNOSIS — F419 Anxiety disorder, unspecified: Secondary | ICD-10-CM | POA: Diagnosis not present

## 2022-05-02 DIAGNOSIS — F319 Bipolar disorder, unspecified: Secondary | ICD-10-CM | POA: Diagnosis not present

## 2022-05-03 ENCOUNTER — Other Ambulatory Visit (HOSPITAL_COMMUNITY): Payer: Self-pay

## 2022-05-03 MED ORDER — CAPLYTA 42 MG PO CAPS
42.0000 mg | ORAL_CAPSULE | Freq: Every evening | ORAL | 2 refills | Status: DC
  Filled 2022-05-03 – 2022-05-05 (×3): qty 30, 30d supply, fill #0

## 2022-05-04 ENCOUNTER — Other Ambulatory Visit (HOSPITAL_COMMUNITY): Payer: Self-pay

## 2022-05-05 ENCOUNTER — Other Ambulatory Visit (HOSPITAL_BASED_OUTPATIENT_CLINIC_OR_DEPARTMENT_OTHER): Payer: Self-pay

## 2022-05-05 ENCOUNTER — Other Ambulatory Visit (HOSPITAL_COMMUNITY): Payer: Self-pay

## 2022-05-12 ENCOUNTER — Other Ambulatory Visit (HOSPITAL_COMMUNITY): Payer: Self-pay

## 2022-05-14 ENCOUNTER — Other Ambulatory Visit (HOSPITAL_COMMUNITY): Payer: Self-pay

## 2022-05-14 DIAGNOSIS — Z79899 Other long term (current) drug therapy: Secondary | ICD-10-CM | POA: Diagnosis not present

## 2022-05-14 DIAGNOSIS — Z01818 Encounter for other preprocedural examination: Secondary | ICD-10-CM | POA: Diagnosis not present

## 2022-05-14 DIAGNOSIS — F319 Bipolar disorder, unspecified: Secondary | ICD-10-CM | POA: Diagnosis not present

## 2022-05-14 DIAGNOSIS — G8918 Other acute postprocedural pain: Secondary | ICD-10-CM | POA: Diagnosis not present

## 2022-05-14 DIAGNOSIS — M24151 Other articular cartilage disorders, right hip: Secondary | ICD-10-CM | POA: Diagnosis not present

## 2022-05-14 DIAGNOSIS — F419 Anxiety disorder, unspecified: Secondary | ICD-10-CM | POA: Diagnosis not present

## 2022-05-14 DIAGNOSIS — M25851 Other specified joint disorders, right hip: Secondary | ICD-10-CM | POA: Insufficient documentation

## 2022-05-14 DIAGNOSIS — M24851 Other specific joint derangements of right hip, not elsewhere classified: Secondary | ICD-10-CM | POA: Diagnosis not present

## 2022-05-14 DIAGNOSIS — S73191A Other sprain of right hip, initial encounter: Secondary | ICD-10-CM | POA: Diagnosis not present

## 2022-05-14 DIAGNOSIS — F32A Depression, unspecified: Secondary | ICD-10-CM | POA: Diagnosis not present

## 2022-05-15 ENCOUNTER — Other Ambulatory Visit: Payer: Self-pay

## 2022-05-15 ENCOUNTER — Other Ambulatory Visit (HOSPITAL_COMMUNITY): Payer: Self-pay

## 2022-05-28 HISTORY — PX: UPPER GASTROINTESTINAL ENDOSCOPY: SHX188

## 2022-05-29 ENCOUNTER — Other Ambulatory Visit (HOSPITAL_COMMUNITY): Payer: Self-pay

## 2022-05-29 MED ORDER — BUPROPION HCL ER (XL) 300 MG PO TB24
300.0000 mg | ORAL_TABLET | Freq: Every morning | ORAL | 0 refills | Status: DC
  Filled 2022-05-29 – 2022-06-01 (×3): qty 90, 90d supply, fill #0

## 2022-05-29 MED ORDER — TOPIRAMATE 25 MG PO TABS
50.0000 mg | ORAL_TABLET | Freq: Every day | ORAL | 0 refills | Status: DC
  Filled 2022-05-29 – 2022-06-01 (×3): qty 120, 60d supply, fill #0

## 2022-05-30 ENCOUNTER — Other Ambulatory Visit: Payer: Self-pay

## 2022-05-30 ENCOUNTER — Other Ambulatory Visit (HOSPITAL_COMMUNITY): Payer: Self-pay

## 2022-05-31 ENCOUNTER — Other Ambulatory Visit: Payer: Self-pay

## 2022-05-31 ENCOUNTER — Other Ambulatory Visit (HOSPITAL_COMMUNITY): Payer: Self-pay

## 2022-06-01 ENCOUNTER — Other Ambulatory Visit (HOSPITAL_COMMUNITY): Payer: Self-pay

## 2022-06-01 ENCOUNTER — Other Ambulatory Visit: Payer: Self-pay

## 2022-06-01 ENCOUNTER — Other Ambulatory Visit (HOSPITAL_BASED_OUTPATIENT_CLINIC_OR_DEPARTMENT_OTHER): Payer: Self-pay

## 2022-06-01 DIAGNOSIS — F319 Bipolar disorder, unspecified: Secondary | ICD-10-CM | POA: Diagnosis not present

## 2022-06-01 DIAGNOSIS — F419 Anxiety disorder, unspecified: Secondary | ICD-10-CM | POA: Diagnosis not present

## 2022-06-01 MED ORDER — CAPLYTA 42 MG PO CAPS
42.0000 mg | ORAL_CAPSULE | Freq: Every evening | ORAL | 1 refills | Status: DC
  Filled 2022-06-01: qty 30, 30d supply, fill #0
  Filled 2022-06-30: qty 30, 30d supply, fill #1

## 2022-06-04 ENCOUNTER — Other Ambulatory Visit (HOSPITAL_COMMUNITY): Payer: Self-pay

## 2022-06-04 DIAGNOSIS — R531 Weakness: Secondary | ICD-10-CM | POA: Diagnosis not present

## 2022-06-04 DIAGNOSIS — Z9889 Other specified postprocedural states: Secondary | ICD-10-CM | POA: Diagnosis not present

## 2022-06-04 DIAGNOSIS — M25551 Pain in right hip: Secondary | ICD-10-CM | POA: Diagnosis not present

## 2022-06-04 DIAGNOSIS — M25651 Stiffness of right hip, not elsewhere classified: Secondary | ICD-10-CM | POA: Diagnosis not present

## 2022-06-07 ENCOUNTER — Other Ambulatory Visit (HOSPITAL_COMMUNITY): Payer: Self-pay

## 2022-06-07 DIAGNOSIS — F319 Bipolar disorder, unspecified: Secondary | ICD-10-CM | POA: Diagnosis not present

## 2022-06-07 DIAGNOSIS — F419 Anxiety disorder, unspecified: Secondary | ICD-10-CM | POA: Diagnosis not present

## 2022-06-07 MED ORDER — TRAZODONE HCL 50 MG PO TABS
25.0000 mg | ORAL_TABLET | Freq: Every day | ORAL | 0 refills | Status: DC
  Filled 2022-06-07: qty 60, 30d supply, fill #0

## 2022-06-07 MED ORDER — GABAPENTIN 100 MG PO CAPS
100.0000 mg | ORAL_CAPSULE | Freq: Two times a day (BID) | ORAL | 0 refills | Status: DC | PRN
  Filled 2022-06-07: qty 120, 30d supply, fill #0

## 2022-06-13 ENCOUNTER — Other Ambulatory Visit (HOSPITAL_COMMUNITY): Payer: Self-pay

## 2022-06-13 DIAGNOSIS — R531 Weakness: Secondary | ICD-10-CM | POA: Diagnosis not present

## 2022-06-13 DIAGNOSIS — Z9889 Other specified postprocedural states: Secondary | ICD-10-CM | POA: Diagnosis not present

## 2022-06-13 DIAGNOSIS — M25651 Stiffness of right hip, not elsewhere classified: Secondary | ICD-10-CM | POA: Diagnosis not present

## 2022-06-13 DIAGNOSIS — F411 Generalized anxiety disorder: Secondary | ICD-10-CM | POA: Diagnosis not present

## 2022-06-13 DIAGNOSIS — M25551 Pain in right hip: Secondary | ICD-10-CM | POA: Diagnosis not present

## 2022-06-13 DIAGNOSIS — F339 Major depressive disorder, recurrent, unspecified: Secondary | ICD-10-CM | POA: Diagnosis not present

## 2022-06-13 MED ORDER — MELOXICAM 15 MG PO TABS
ORAL_TABLET | ORAL | 0 refills | Status: DC
  Filled 2022-06-13: qty 30, 30d supply, fill #0

## 2022-06-14 ENCOUNTER — Other Ambulatory Visit (HOSPITAL_COMMUNITY): Payer: Self-pay

## 2022-06-20 DIAGNOSIS — F411 Generalized anxiety disorder: Secondary | ICD-10-CM | POA: Diagnosis not present

## 2022-06-20 DIAGNOSIS — F339 Major depressive disorder, recurrent, unspecified: Secondary | ICD-10-CM | POA: Diagnosis not present

## 2022-06-22 ENCOUNTER — Other Ambulatory Visit (HOSPITAL_COMMUNITY): Payer: Self-pay

## 2022-06-22 DIAGNOSIS — M25851 Other specified joint disorders, right hip: Secondary | ICD-10-CM | POA: Diagnosis not present

## 2022-06-22 DIAGNOSIS — F319 Bipolar disorder, unspecified: Secondary | ICD-10-CM | POA: Diagnosis not present

## 2022-06-22 DIAGNOSIS — Z9889 Other specified postprocedural states: Secondary | ICD-10-CM | POA: Diagnosis not present

## 2022-06-22 DIAGNOSIS — F419 Anxiety disorder, unspecified: Secondary | ICD-10-CM | POA: Diagnosis not present

## 2022-06-22 MED ORDER — TOPIRAMATE 25 MG PO TABS
50.0000 mg | ORAL_TABLET | Freq: Every evening | ORAL | 2 refills | Status: DC
  Filled 2022-06-22: qty 60, 30d supply, fill #0

## 2022-06-22 MED ORDER — GABAPENTIN 100 MG PO CAPS
100.0000 mg | ORAL_CAPSULE | Freq: Two times a day (BID) | ORAL | 2 refills | Status: DC | PRN
  Filled 2022-06-22 – 2022-08-16 (×2): qty 120, 30d supply, fill #0

## 2022-06-22 MED ORDER — BUPROPION HCL ER (XL) 300 MG PO TB24
300.0000 mg | ORAL_TABLET | Freq: Every morning | ORAL | 0 refills | Status: DC
  Filled 2022-06-22 – 2022-08-30 (×2): qty 90, 90d supply, fill #0

## 2022-06-22 MED ORDER — TRAZODONE HCL 50 MG PO TABS
25.0000 mg | ORAL_TABLET | Freq: Every evening | ORAL | 2 refills | Status: DC
  Filled 2022-06-22: qty 60, 60d supply, fill #0

## 2022-06-22 MED ORDER — OLANZAPINE 5 MG PO TABS
5.0000 mg | ORAL_TABLET | Freq: Every evening | ORAL | 0 refills | Status: DC
  Filled 2022-06-22: qty 30, 30d supply, fill #0

## 2022-06-25 ENCOUNTER — Other Ambulatory Visit (HOSPITAL_COMMUNITY): Payer: Self-pay

## 2022-06-27 DIAGNOSIS — F339 Major depressive disorder, recurrent, unspecified: Secondary | ICD-10-CM | POA: Diagnosis not present

## 2022-06-27 DIAGNOSIS — M25551 Pain in right hip: Secondary | ICD-10-CM | POA: Diagnosis not present

## 2022-06-27 DIAGNOSIS — F411 Generalized anxiety disorder: Secondary | ICD-10-CM | POA: Diagnosis not present

## 2022-06-27 DIAGNOSIS — M25651 Stiffness of right hip, not elsewhere classified: Secondary | ICD-10-CM | POA: Diagnosis not present

## 2022-06-27 DIAGNOSIS — Z9889 Other specified postprocedural states: Secondary | ICD-10-CM | POA: Diagnosis not present

## 2022-06-27 DIAGNOSIS — R531 Weakness: Secondary | ICD-10-CM | POA: Diagnosis not present

## 2022-06-29 ENCOUNTER — Other Ambulatory Visit (HOSPITAL_COMMUNITY): Payer: Self-pay | Admitting: Orthopedic Surgery

## 2022-06-29 DIAGNOSIS — M25551 Pain in right hip: Secondary | ICD-10-CM

## 2022-07-02 ENCOUNTER — Other Ambulatory Visit (HOSPITAL_COMMUNITY): Payer: Self-pay

## 2022-07-04 DIAGNOSIS — F411 Generalized anxiety disorder: Secondary | ICD-10-CM | POA: Diagnosis not present

## 2022-07-04 DIAGNOSIS — F339 Major depressive disorder, recurrent, unspecified: Secondary | ICD-10-CM | POA: Diagnosis not present

## 2022-07-09 ENCOUNTER — Other Ambulatory Visit: Payer: Self-pay

## 2022-07-09 ENCOUNTER — Other Ambulatory Visit (HOSPITAL_COMMUNITY): Payer: Self-pay

## 2022-07-09 ENCOUNTER — Ambulatory Visit: Payer: 59 | Admitting: Podiatry

## 2022-07-09 DIAGNOSIS — F419 Anxiety disorder, unspecified: Secondary | ICD-10-CM | POA: Diagnosis not present

## 2022-07-09 DIAGNOSIS — F319 Bipolar disorder, unspecified: Secondary | ICD-10-CM | POA: Diagnosis not present

## 2022-07-09 MED ORDER — TOPIRAMATE 25 MG PO TABS
75.0000 mg | ORAL_TABLET | Freq: Every day | ORAL | 2 refills | Status: DC
  Filled 2022-07-09 (×2): qty 75, 25d supply, fill #0
  Filled 2022-08-09: qty 75, 25d supply, fill #1

## 2022-07-09 MED ORDER — DICLOFENAC SODIUM 75 MG PO TBEC
75.0000 mg | DELAYED_RELEASE_TABLET | Freq: Two times a day (BID) | ORAL | 0 refills | Status: DC
  Filled 2022-07-09: qty 60, 30d supply, fill #0

## 2022-07-11 ENCOUNTER — Ambulatory Visit (HOSPITAL_COMMUNITY)
Admission: RE | Admit: 2022-07-11 | Discharge: 2022-07-11 | Disposition: A | Discharge status: Home / Self Care | Payer: 59 | Source: Ambulatory Visit | Attending: Orthopedic Surgery | Admitting: Orthopedic Surgery

## 2022-07-11 DIAGNOSIS — M25551 Pain in right hip: Secondary | ICD-10-CM | POA: Insufficient documentation

## 2022-07-11 DIAGNOSIS — M25451 Effusion, right hip: Secondary | ICD-10-CM | POA: Diagnosis not present

## 2022-07-11 DIAGNOSIS — Z9889 Other specified postprocedural states: Secondary | ICD-10-CM | POA: Diagnosis not present

## 2022-07-11 MED ORDER — LIDOCAINE HCL (PF) 1 % IJ SOLN
5.0000 mL | Freq: Once | INTRAMUSCULAR | Status: AC
  Administered 2022-07-11: 8 mL via INTRADERMAL

## 2022-07-11 MED ORDER — GADOBUTROL 1 MMOL/ML IV SOLN
0.0500 mL | Freq: Once | INTRAVENOUS | Status: AC | PRN
  Administered 2022-07-11: 0.05 mL

## 2022-07-11 MED ORDER — SODIUM CHLORIDE (PF) 0.9 % IJ SOLN
10.0000 mL | Freq: Once | INTRAMUSCULAR | Status: AC
  Administered 2022-07-11: 10 mL

## 2022-07-11 MED ORDER — IOHEXOL 180 MG/ML  SOLN
10.0000 mL | Freq: Once | INTRAMUSCULAR | Status: AC | PRN
  Administered 2022-07-11: 10 mL via INTRA_ARTICULAR

## 2022-07-12 ENCOUNTER — Other Ambulatory Visit (HOSPITAL_COMMUNITY): Payer: Self-pay

## 2022-07-12 MED ORDER — TIZANIDINE HCL 2 MG PO TABS
ORAL_TABLET | ORAL | 11 refills | Status: DC
  Filled 2022-07-12: qty 90, 30d supply, fill #0

## 2022-07-13 ENCOUNTER — Ambulatory Visit (INDEPENDENT_AMBULATORY_CARE_PROVIDER_SITE_OTHER): Payer: 59 | Admitting: Podiatry

## 2022-07-13 DIAGNOSIS — I7300 Raynaud's syndrome without gangrene: Secondary | ICD-10-CM

## 2022-07-13 DIAGNOSIS — B351 Tinea unguium: Secondary | ICD-10-CM

## 2022-07-13 MED ORDER — EFINACONAZOLE 10 % EX SOLN: 1.0000 [drp] | Freq: Every day | CUTANEOUS | 11 refills | Status: DC

## 2022-07-13 NOTE — Progress Notes (Unsigned)
Subjective: Chief Complaint  Patient presents with   Nail Problem   27 year old female presents the office with above concerns.  She said the toenails have not grown much.  Currently no pain in the nails no swelling redness or drainage.  She does have Raynauds and she is also currently on a calcium channel blocker.  No open sores that she reports.  Objective: AAO x3, NAD DP/PT pulses palpable bilaterally, CRT less than 3 seconds Bilateral hallux nails are hypertrophic, dystrophic with yellow, brown discoloration.  There is no edema, erythema or signs of infection.  There is no open lesions.  No evidence of gangrene. No pain with calf compression, swelling, warmth, erythema  Assessment: Onychomycosis, Raynaud's   Plan: -All treatment options discussed with the patient including all alternatives, risks, complications.  -She is already on a biotin supplement which I recommend continuing with. I have ordered Jublia for the nails.  Also will order compound cream through [place] [facility] to help with circulation.  Discussed topical medication such as tea tree oil. -Patient encouraged to call the office with any questions, concerns, change in symptoms.   Trula Slade DPM

## 2022-07-14 ENCOUNTER — Encounter: Payer: Self-pay | Admitting: Podiatry

## 2022-07-17 ENCOUNTER — Other Ambulatory Visit: Payer: Self-pay

## 2022-07-17 ENCOUNTER — Other Ambulatory Visit (HOSPITAL_COMMUNITY): Payer: Self-pay

## 2022-07-17 DIAGNOSIS — I7300 Raynaud's syndrome without gangrene: Secondary | ICD-10-CM | POA: Diagnosis not present

## 2022-07-17 DIAGNOSIS — F329 Major depressive disorder, single episode, unspecified: Secondary | ICD-10-CM | POA: Diagnosis not present

## 2022-07-17 DIAGNOSIS — R29898 Other symptoms and signs involving the musculoskeletal system: Secondary | ICD-10-CM | POA: Diagnosis not present

## 2022-07-17 DIAGNOSIS — R202 Paresthesia of skin: Secondary | ICD-10-CM

## 2022-07-17 MED ORDER — NONFORMULARY OR COMPOUNDED ITEM: 0 refills | Status: DC

## 2022-07-17 MED ORDER — AMLODIPINE BESYLATE 5 MG PO TABS
5.0000 mg | ORAL_TABLET | Freq: Every day | ORAL | 1 refills | Status: DC
  Filled 2022-07-17: qty 90, 90d supply, fill #0

## 2022-07-18 DIAGNOSIS — R531 Weakness: Secondary | ICD-10-CM | POA: Diagnosis not present

## 2022-07-18 DIAGNOSIS — M25651 Stiffness of right hip, not elsewhere classified: Secondary | ICD-10-CM | POA: Diagnosis not present

## 2022-07-18 DIAGNOSIS — Z9889 Other specified postprocedural states: Secondary | ICD-10-CM | POA: Diagnosis not present

## 2022-07-18 DIAGNOSIS — M25551 Pain in right hip: Secondary | ICD-10-CM | POA: Diagnosis not present

## 2022-07-18 DIAGNOSIS — F411 Generalized anxiety disorder: Secondary | ICD-10-CM | POA: Diagnosis not present

## 2022-07-18 DIAGNOSIS — F339 Major depressive disorder, recurrent, unspecified: Secondary | ICD-10-CM | POA: Diagnosis not present

## 2022-07-19 ENCOUNTER — Ambulatory Visit (INDEPENDENT_AMBULATORY_CARE_PROVIDER_SITE_OTHER): Payer: 59 | Admitting: Neurology

## 2022-07-19 DIAGNOSIS — R202 Paresthesia of skin: Secondary | ICD-10-CM

## 2022-07-19 NOTE — Procedures (Signed)
  [facility]  [place], [place]  [place], [place] [address] Tel: [phone] Fax: [phone] Test Date:  07/19/2022  Patient: Amanda Gill DOB: 08-Apr-1996 Physician: Narda Amber, DO  Sex: Female Height: 5' 9"$  Ref Phys: Genia Del, MD  ID#: [id number]   Technician:    History: This is a 27 year-old female s/p right hip arthroscopy referred for evaluation of right leg weakness.   NCV & EMG Findings: Extensive electrodiagnostic testing of the right lower extremity shows:  Right sural, superficial peroneal, and saphenous sensory responses are within normal limits. Right peroneal and tibial motor responses are within normal limits. Right tibial H reflex study is within normal limits. There is no evidence of active or chronic motor axon loss changes affecting any of the tested muscles.  Incomplete motor unit activation is seen in the right gluteus medius and iliopsoas muscles as evidenced by variable recruitment.  These findings may be due to pain, musculoskeletal pathology, or poor effort.  Impression: This is a normal study of the right lower extremity.  In particular, there is no evidence of a lumbosacral plexopathy, lumbosacral radiculopathy, or sensorimotor neuropathy.  ___________________________ Narda Amber, DO    Nerve Conduction Studies   Stim Site NR Peak (ms) Norm Peak (ms) O-P Amp (V) Norm O-P Amp  Right Saphenous Anti Sensory (Ant Med Mall)  32 C  14cm    2.7 <4.4 22.0 >6  Right Sup Peroneal Anti Sensory (Ant Lat Mall)  32 C  12 cm    2.4 <4.4 23.1 >6  Right Sural Anti Sensory (Lat Mall)  32 C  Calf    2.8 <4.4 22.4 >6     Stim Site NR Onset (ms) Norm Onset (ms) O-P Amp (mV) Norm O-P Amp Site1 Site2 Delta-0 (ms) Dist (cm) Vel (m/s) Norm Vel (m/s)  Right Peroneal Motor (Ext Dig Brev)  32 C  Ankle    2.7 <5.5 7.2 >3 B Fib Ankle 8.2 43.0 52 >41  B Fib    10.9  6.8  Poplt B Fib 1.5 9.0 60 >41  Poplt    12.4  6.7         Right Tibial  Motor (Abd Hall Brev)  32 C  Ankle    3.6 <5.8 11.4 >8 Knee Ankle 9.1 45.0 49 >41  Knee    12.7  8.4          Electromyography   Side Muscle Ins.Act Fibs Fasc Recrt Amp Dur Poly Activation Comment  Right AntTibialis Nml Nml Nml Nml Nml Nml Nml Nml N/A  Right Gastroc Nml Nml Nml Nml Nml Nml Nml Nml N/A  Right Flex Dig Long Nml Nml Nml Nml Nml Nml Nml Nml N/A  Right RectFemoris Nml Nml Nml Nml Nml Nml Nml Nml N/A  Right BicepsFemS Nml Nml Nml Nml Nml Nml Nml Nml N/A  Right GluteusMed Nml Nml Nml *2- Nml Nml Nml *Variable N/A  Right Iliopsoas Nml Nml Nml *2- Nml Nml Nml *Variable N/A      Waveforms:

## 2022-07-20 ENCOUNTER — Other Ambulatory Visit (HOSPITAL_COMMUNITY): Payer: Self-pay

## 2022-07-20 ENCOUNTER — Other Ambulatory Visit: Payer: Self-pay | Admitting: *Deleted

## 2022-07-20 DIAGNOSIS — R209 Unspecified disturbances of skin sensation: Secondary | ICD-10-CM

## 2022-07-23 ENCOUNTER — Other Ambulatory Visit (HOSPITAL_COMMUNITY): Payer: Self-pay

## 2022-07-23 ENCOUNTER — Ambulatory Visit (INDEPENDENT_AMBULATORY_CARE_PROVIDER_SITE_OTHER): Payer: 59

## 2022-07-23 DIAGNOSIS — B351 Tinea unguium: Secondary | ICD-10-CM

## 2022-07-23 NOTE — Progress Notes (Signed)
Patient presents today for the 1st  laser treatment. Diagnosed with mycotic nail infection by Dr. Jacqualyn Posey.   Toenail most affected bilateral hallux .  All other systems are negative.  Nails were filed thin. Laser therapy was administered to bilateral hallux toenails  and patient tolerated the treatment well. All safety precautions were in place.   Single laser pass was done on non-affected nails.   Follow up in 4 weeks for laser # 2.

## 2022-07-24 ENCOUNTER — Other Ambulatory Visit (HOSPITAL_COMMUNITY): Payer: Self-pay

## 2022-07-24 ENCOUNTER — Telehealth: Payer: Self-pay

## 2022-07-24 MED ORDER — OLANZAPINE 5 MG PO TABS
5.0000 mg | ORAL_TABLET | Freq: Every day | ORAL | 0 refills | Status: DC
  Filled 2022-07-24: qty 30, 30d supply, fill #0

## 2022-07-24 NOTE — Telephone Encounter (Signed)
-----   Message from Lorine Bears, NP sent at 07/18/2022  8:29 AM EST ----- Better served with neurology, would try [facility], Dr. Ysidro [place]/Dr. Narda Amber. They are part of [place].  ----- Message ----- From: Casilda Carls, CMA Sent: 07/18/2022   8:18 AM EST To: Magnus Sinning, MD; Lorine Bears, NP   ----- Message ----- From: [place] Sent: 07/17/2022   3:27 PM EST To: Casilda Carls, CMA

## 2022-07-24 NOTE — Telephone Encounter (Signed)
Called [facility] and left voicemail explaining that the patient may be better served at [facility] for the EMG

## 2022-07-25 ENCOUNTER — Other Ambulatory Visit (HOSPITAL_COMMUNITY): Payer: Self-pay

## 2022-07-25 DIAGNOSIS — M25651 Stiffness of right hip, not elsewhere classified: Secondary | ICD-10-CM | POA: Diagnosis not present

## 2022-07-25 DIAGNOSIS — Z9889 Other specified postprocedural states: Secondary | ICD-10-CM | POA: Diagnosis not present

## 2022-07-25 DIAGNOSIS — F411 Generalized anxiety disorder: Secondary | ICD-10-CM | POA: Diagnosis not present

## 2022-07-25 DIAGNOSIS — R531 Weakness: Secondary | ICD-10-CM | POA: Diagnosis not present

## 2022-07-25 DIAGNOSIS — F339 Major depressive disorder, recurrent, unspecified: Secondary | ICD-10-CM | POA: Diagnosis not present

## 2022-07-25 DIAGNOSIS — M25551 Pain in right hip: Secondary | ICD-10-CM | POA: Diagnosis not present

## 2022-07-25 MED ORDER — VALACYCLOVIR HCL 1 G PO TABS
1000.0000 mg | ORAL_TABLET | Freq: Two times a day (BID) | ORAL | 0 refills | Status: DC
  Filled 2022-07-25: qty 10, 5d supply, fill #0

## 2022-07-26 DIAGNOSIS — F419 Anxiety disorder, unspecified: Secondary | ICD-10-CM | POA: Diagnosis not present

## 2022-07-26 DIAGNOSIS — F319 Bipolar disorder, unspecified: Secondary | ICD-10-CM | POA: Diagnosis not present

## 2022-07-30 DIAGNOSIS — R202 Paresthesia of skin: Secondary | ICD-10-CM | POA: Diagnosis not present

## 2022-07-30 DIAGNOSIS — M79642 Pain in left hand: Secondary | ICD-10-CM | POA: Diagnosis not present

## 2022-07-30 DIAGNOSIS — R269 Unspecified abnormalities of gait and mobility: Secondary | ICD-10-CM | POA: Diagnosis not present

## 2022-07-30 DIAGNOSIS — Z9889 Other specified postprocedural states: Secondary | ICD-10-CM | POA: Diagnosis not present

## 2022-07-30 DIAGNOSIS — R531 Weakness: Secondary | ICD-10-CM | POA: Insufficient documentation

## 2022-07-30 DIAGNOSIS — R29898 Other symptoms and signs involving the musculoskeletal system: Secondary | ICD-10-CM | POA: Diagnosis not present

## 2022-07-30 DIAGNOSIS — M25551 Pain in right hip: Secondary | ICD-10-CM | POA: Diagnosis not present

## 2022-07-30 DIAGNOSIS — M25651 Stiffness of right hip, not elsewhere classified: Secondary | ICD-10-CM | POA: Insufficient documentation

## 2022-07-30 DIAGNOSIS — F329 Major depressive disorder, single episode, unspecified: Secondary | ICD-10-CM | POA: Diagnosis not present

## 2022-07-30 DIAGNOSIS — I7300 Raynaud's syndrome without gangrene: Secondary | ICD-10-CM | POA: Diagnosis not present

## 2022-07-30 DIAGNOSIS — M79641 Pain in right hand: Secondary | ICD-10-CM | POA: Diagnosis not present

## 2022-07-30 NOTE — Progress Notes (Unsigned)
[facility] OF [place]  ASSESSMENT / PLAN: 27 y.o. female with Raynaud's phenomenon of both hands and feet.  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  I counseled her that stress can make Raynaud's phenomenon worse.  I encouraged her to work on self-care routine and managing stress levels, but acknowledged this is very difficult in challenging environment like law school.  No concerns from my standpoint from a vascular perspective.  Calcium channel blocker, she is already taking may be helpful.  Phosphodiesterase inhibitors may also be helpful.  She can follow-up with me on an as-needed basis.  CHIEF COMPLAINT: Cold hands and feet  HISTORY OF PRESENT ILLNESS: Amanda Gill is a 27 y.o. female referred to clinic for evaluation of cold hands and feet bilaterally.  Her referring provider also felt diminished radial pulses on exam.  The patient is a Education administrator at [employer].  She has been under a considerable amount of stress recently.  She had to have her final exams schedule compressed last semester.  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  She reports a fairly classic history of Raynaud's phenomenon.  She notices significant discomfort in sensation of coolness in her hands and feet.  This is made worse by exposure to cold weather and by stressors.  She also notices similar sensation in her left nipple and her nose tip.   History reviewed. No pertinent past medical history.  Past Surgical History:  Procedure Laterality Date   DENTAL SURGERY     HIP SURGERY Bilateral     History reviewed. No pertinent family history.  Social History   Socioeconomic History   Marital status: Single    Spouse name: Not on file   Number of children: Not on file   Years of education: Not on file   Highest education level: Not on file  Occupational History   Not on file  Tobacco  Use   Smoking status: Never   Smokeless tobacco: Never  Substance and Sexual Activity   Alcohol use: No   Drug use: No   Sexual activity: Not on file  Other Topics Concern   Not on file  Social History Narrative   Not on file   Social Determinants of Health   Financial Resource Strain: Not on file  Food Insecurity: Not on file  Transportation Needs: Not on file  Physical Activity: Not on file  Stress: Not on file  Social Connections: Not on file  Intimate Partner Violence: Not on file    Allergies  Allergen Reactions   Chlorhexidine Other (See Comments), Itching, Rash and Shortness Of Breath    With shower prep night before surgery With shower prep night before surgery With shower prep night before surgery    Milk (Cow) Other (See Comments)    Current Outpatient Medications  Medication Sig Dispense Refill   amLODipine (NORVASC) 5 MG tablet Take 1 tablet (5 mg total) by mouth daily. 90 tablet 1   buPROPion (WELLBUTRIN XL) 300 MG 24 hr tablet Take 1 tablet (300 mg total) by mouth every morning. 90 tablet 0   busPIRone (BUSPAR) 15 MG tablet TAKE 1 TABLET UPTO 3 TIMES A DAY.  MAX 60 MG DAILY.     clindamycin (CLEOCIN T) 1 % external solution clindamycin phosphate 1 % topical solution     diclofenac (VOLTAREN) 75 MG EC tablet Take 1 tablet (75 mg total)  by mouth 2 (two) times daily with a meal. 60 tablet 0   Efinaconazole 10 % SOLN Apply 1 drop topically daily. 4 mL 11   EPINEPHrine 0.3 mg/0.3 mL IJ SOAJ injection Inject into the muscle.     fluconazole (DIFLUCAN) 150 MG tablet take 1 tablet by mouth every 3 days 3 tablet 1   gabapentin (NEURONTIN) 100 MG capsule Take 1-2 capsules (100-200 mg total) by mouth 2 (two) times daily as needed for anxiety 120 capsule 2   ipratropium (ATROVENT) 0.06 % nasal spray Use 2 sprays in each nostril 3 times a day 15 mL 0   ketoconazole (NIZORAL) 2 % cream Apply to affected area once daily 60 g 1   Levonorgestrel (KYLEENA) 19.5 MG IUD  Kyleena 17.5 mcg/24 hrs (31yr) 19.'5mg'$  intrauterine device  Take 1 device by intrauterine route.     lumateperone tosylate (CAPLYTA) 42 MG capsule Take 1 capsule (42 mg total) by mouth at bedtime. 30 capsule 2   lurasidone (LATUDA) 40 MG TABS tablet Take 1 tablet (40 mg total) by mouth at bedtime.  (take with at least 350 calories) 90 tablet 0   meloxicam (MOBIC) 15 MG tablet Take 1 tablet (15 mg total) by mouth once daily 30 tablet 0   mirabegron ER (MYRBETRIQ) 50 MG TB24 tablet TAKE 1 TABLET BY MOUTH ONCE DAILY. 90 tablet 2   molnupiravir EUA (LAGEVRIO) 200 MG CAPS capsule Take 4 capsules by mouth every 12 hours for 5 days 40 capsule 0   NIFEdipine (ADALAT CC) 60 MG 24 hr tablet Take 1 tablet (60 mg total) by mouth daily. 90 tablet 0   NONFORMULARY OR COMPOUNDED ITEM [facility]: circulation 10 each 0   OLANZapine (ZYPREXA) 5 MG tablet Take 1 tablet (5 mg total) by mouth at bedtime. 30 tablet 0   topiramate (TOPAMAX) 25 MG tablet Take 3 tablets (75 mg total) by mouth at bedtime. 75 tablet 2   traMADol (ULTRAM) 50 MG tablet Take 1 tablet (50 mg total) by mouth every 8 (eight) hours as needed for pain 30 tablet 0   traZODone (DESYREL) 50 MG tablet Take 0.5-1 tablets (25-50 mg total) by mouth at bedtime. 60 tablet 2   triamcinolone lotion (KENALOG) 0.1 % Apply to affected area 2 times a day for 14 days as directed 60 mL 0   venlafaxine XR (EFFEXOR XR) 37.5 MG 24 hr capsule Take 37.5 mg by mouth. Take 1 capsule by mouth every morning     No current facility-administered medications for this visit.    PHYSICAL EXAM Vitals:   07/31/22 1449  BP: 109/73  Pulse: 100  Resp: 20  Temp: 99 F (37.2 C)  SpO2: 95%  Weight: 182 lb (82.6 kg)  Height: '5\' 9"'$  (1.753 m)   Well-appearing young woman in no acute distress Regular rate and rhythm Unlabored breathing Easily palpable radial pulses Easily palpable dorsalis pedis pulses  PERTINENT LABORATORY AND RADIOLOGIC DATA  Most recent  CBC    Latest Ref Rng & Units 12/06/2013    5:05 PM  CBC  WBC 4.5 - 13.5 K/uL 5.8   Hemoglobin 11.0 - 14.6 g/dL 12.7   Hematocrit 33.0 - 44.0 % 35.5   Platelets 150 - 400 K/uL 281      Most recent CMP    Latest Ref Rng & Units 12/06/2013    5:05 PM  CMP  Glucose 70 - 99 mg/dL 100   BUN 6 - 23 mg/dL 5   Creatinine 0.47 -  1.00 mg/dL 0.60   Sodium 137 - 147 mEq/L 140   Potassium 3.7 - 5.3 mEq/L 3.4   Chloride 96 - 112 mEq/L 103   CO2 19 - 32 mEq/L 20   Calcium 8.4 - 10.5 mg/dL 9.5   Total Protein 6.0 - 8.3 g/dL 7.7   Total Bilirubin 0.3 - 1.2 mg/dL 1.5   Alkaline Phos 50 - 162 U/L 69   AST 0 - 37 U/L 16   ALT 0 - 35 U/L 6    Digital waveform analysis with cold and warm exposures: Right: Second digit waveform triphasic at ambient temperature.         Waveform morphology improves with warm blanket.  Left: Second digit waveform triphasic at ambient temperature.        Waveform morphology improves with warm blanket.      Yevonne Aline. Stanford Breed, MD [facility] Phone Number: [phone] 07/31/2022 4:26 PM   Total time spent on preparing this encounter including chart review, data review, collecting history, examining the patient, coordinating care for this new patient, 60 minutes.  Portions of this report may have been transcribed using voice recognition software.  Every effort has been made to ensure accuracy; however, inadvertent computerized transcription errors may still be present.

## 2022-07-31 ENCOUNTER — Ambulatory Visit (HOSPITAL_COMMUNITY)
Admission: RE | Admit: 2022-07-31 | Discharge: 2022-07-31 | Disposition: A | Discharge status: Home / Self Care | Payer: 59 | Source: Ambulatory Visit | Attending: Vascular Surgery | Admitting: Vascular Surgery

## 2022-07-31 ENCOUNTER — Encounter: Payer: Self-pay | Admitting: Vascular Surgery

## 2022-07-31 ENCOUNTER — Ambulatory Visit (INDEPENDENT_AMBULATORY_CARE_PROVIDER_SITE_OTHER): Payer: 59 | Admitting: Vascular Surgery

## 2022-07-31 VITALS — BP 109/73 | HR 100 | Temp 99.0°F | Resp 20 | Ht 69.0 in | Wt 182.0 lb

## 2022-07-31 DIAGNOSIS — R209 Unspecified disturbances of skin sensation: Secondary | ICD-10-CM

## 2022-07-31 DIAGNOSIS — I7300 Raynaud's syndrome without gangrene: Secondary | ICD-10-CM

## 2022-07-31 DIAGNOSIS — F840 Autistic disorder: Secondary | ICD-10-CM | POA: Diagnosis not present

## 2022-07-31 DIAGNOSIS — F909 Attention-deficit hyperactivity disorder, unspecified type: Secondary | ICD-10-CM | POA: Diagnosis not present

## 2022-07-31 DIAGNOSIS — F319 Bipolar disorder, unspecified: Secondary | ICD-10-CM | POA: Diagnosis not present

## 2022-08-01 DIAGNOSIS — F319 Bipolar disorder, unspecified: Secondary | ICD-10-CM | POA: Diagnosis not present

## 2022-08-01 DIAGNOSIS — F840 Autistic disorder: Secondary | ICD-10-CM | POA: Diagnosis not present

## 2022-08-01 DIAGNOSIS — F909 Attention-deficit hyperactivity disorder, unspecified type: Secondary | ICD-10-CM | POA: Diagnosis not present

## 2022-08-02 ENCOUNTER — Other Ambulatory Visit (HOSPITAL_COMMUNITY): Payer: Self-pay

## 2022-08-02 DIAGNOSIS — F840 Autistic disorder: Secondary | ICD-10-CM | POA: Diagnosis not present

## 2022-08-02 DIAGNOSIS — F909 Attention-deficit hyperactivity disorder, unspecified type: Secondary | ICD-10-CM | POA: Diagnosis not present

## 2022-08-02 DIAGNOSIS — F319 Bipolar disorder, unspecified: Secondary | ICD-10-CM | POA: Diagnosis not present

## 2022-08-02 MED ORDER — CAPLYTA 42 MG PO CAPS
42.0000 mg | ORAL_CAPSULE | Freq: Every evening | ORAL | 2 refills | Status: DC
  Filled 2022-08-02: qty 30, 30d supply, fill #0
  Filled 2022-08-30: qty 30, 30d supply, fill #1
  Filled 2022-10-01: qty 30, 30d supply, fill #2

## 2022-08-03 ENCOUNTER — Other Ambulatory Visit (HOSPITAL_COMMUNITY): Payer: Self-pay

## 2022-08-03 DIAGNOSIS — Z124 Encounter for screening for malignant neoplasm of cervix: Secondary | ICD-10-CM | POA: Diagnosis not present

## 2022-08-03 DIAGNOSIS — Z113 Encounter for screening for infections with a predominantly sexual mode of transmission: Secondary | ICD-10-CM | POA: Diagnosis not present

## 2022-08-08 ENCOUNTER — Other Ambulatory Visit (HOSPITAL_COMMUNITY): Payer: Self-pay

## 2022-08-08 DIAGNOSIS — M25651 Stiffness of right hip, not elsewhere classified: Secondary | ICD-10-CM | POA: Diagnosis not present

## 2022-08-08 DIAGNOSIS — Z9889 Other specified postprocedural states: Secondary | ICD-10-CM | POA: Diagnosis not present

## 2022-08-08 DIAGNOSIS — M25551 Pain in right hip: Secondary | ICD-10-CM | POA: Diagnosis not present

## 2022-08-08 DIAGNOSIS — R531 Weakness: Secondary | ICD-10-CM | POA: Diagnosis not present

## 2022-08-08 DIAGNOSIS — F419 Anxiety disorder, unspecified: Secondary | ICD-10-CM | POA: Diagnosis not present

## 2022-08-08 DIAGNOSIS — R29898 Other symptoms and signs involving the musculoskeletal system: Secondary | ICD-10-CM | POA: Diagnosis not present

## 2022-08-08 DIAGNOSIS — F319 Bipolar disorder, unspecified: Secondary | ICD-10-CM | POA: Diagnosis not present

## 2022-08-08 DIAGNOSIS — F411 Generalized anxiety disorder: Secondary | ICD-10-CM | POA: Diagnosis not present

## 2022-08-08 DIAGNOSIS — F339 Major depressive disorder, recurrent, unspecified: Secondary | ICD-10-CM | POA: Diagnosis not present

## 2022-08-08 DIAGNOSIS — R269 Unspecified abnormalities of gait and mobility: Secondary | ICD-10-CM | POA: Diagnosis not present

## 2022-08-08 MED ORDER — BUPROPION HCL ER (XL) 300 MG PO TB24
300.0000 mg | ORAL_TABLET | Freq: Every morning | ORAL | 0 refills | Status: DC
  Filled 2022-08-08 – 2022-12-27 (×17): qty 90, 90d supply, fill #0

## 2022-08-08 MED ORDER — BUSPIRONE HCL 30 MG PO TABS
30.0000 mg | ORAL_TABLET | Freq: Two times a day (BID) | ORAL | 0 refills | Status: DC
  Filled 2022-08-08: qty 60, 30d supply, fill #0

## 2022-08-09 ENCOUNTER — Other Ambulatory Visit (HOSPITAL_COMMUNITY): Payer: Self-pay

## 2022-08-09 DIAGNOSIS — Z9889 Other specified postprocedural states: Secondary | ICD-10-CM | POA: Insufficient documentation

## 2022-08-09 MED ORDER — SILDENAFIL CITRATE 20 MG PO TABS
20.0000 mg | ORAL_TABLET | Freq: Every day | ORAL | 3 refills | Status: DC
  Filled 2022-08-09: qty 30, 30d supply, fill #0
  Filled 2022-09-05: qty 30, 30d supply, fill #1

## 2022-08-13 DIAGNOSIS — F4011 Social phobia, generalized: Secondary | ICD-10-CM | POA: Diagnosis not present

## 2022-08-13 DIAGNOSIS — F3173 Bipolar disorder, in partial remission, most recent episode manic: Secondary | ICD-10-CM | POA: Diagnosis not present

## 2022-08-13 DIAGNOSIS — M25551 Pain in right hip: Secondary | ICD-10-CM | POA: Diagnosis not present

## 2022-08-15 DIAGNOSIS — R29898 Other symptoms and signs involving the musculoskeletal system: Secondary | ICD-10-CM | POA: Diagnosis not present

## 2022-08-15 DIAGNOSIS — M25651 Stiffness of right hip, not elsewhere classified: Secondary | ICD-10-CM | POA: Diagnosis not present

## 2022-08-15 DIAGNOSIS — R531 Weakness: Secondary | ICD-10-CM | POA: Diagnosis not present

## 2022-08-15 DIAGNOSIS — F411 Generalized anxiety disorder: Secondary | ICD-10-CM | POA: Diagnosis not present

## 2022-08-15 DIAGNOSIS — Z9889 Other specified postprocedural states: Secondary | ICD-10-CM | POA: Diagnosis not present

## 2022-08-15 DIAGNOSIS — R269 Unspecified abnormalities of gait and mobility: Secondary | ICD-10-CM | POA: Diagnosis not present

## 2022-08-15 DIAGNOSIS — F339 Major depressive disorder, recurrent, unspecified: Secondary | ICD-10-CM | POA: Diagnosis not present

## 2022-08-15 DIAGNOSIS — M25551 Pain in right hip: Secondary | ICD-10-CM | POA: Diagnosis not present

## 2022-08-16 ENCOUNTER — Other Ambulatory Visit (HOSPITAL_COMMUNITY): Payer: Self-pay

## 2022-08-17 ENCOUNTER — Other Ambulatory Visit (HOSPITAL_COMMUNITY): Payer: Self-pay

## 2022-08-17 DIAGNOSIS — J45909 Unspecified asthma, uncomplicated: Secondary | ICD-10-CM | POA: Diagnosis not present

## 2022-08-17 DIAGNOSIS — J302 Other seasonal allergic rhinitis: Secondary | ICD-10-CM | POA: Diagnosis not present

## 2022-08-17 MED ORDER — ALBUTEROL SULFATE HFA 108 (90 BASE) MCG/ACT IN AERS
1.0000 | INHALATION_SPRAY | Freq: Four times a day (QID) | RESPIRATORY_TRACT | 1 refills | Status: DC
  Filled 2022-08-17: qty 6.7, 25d supply, fill #0
  Filled 2022-10-12: qty 6.7, 25d supply, fill #1

## 2022-08-17 MED ORDER — AZELASTINE HCL 137 MCG/SPRAY NA SOLN
1.0000 | Freq: Two times a day (BID) | NASAL | 3 refills | Status: DC
  Filled 2022-08-17: qty 30, 50d supply, fill #0

## 2022-08-21 ENCOUNTER — Other Ambulatory Visit (HOSPITAL_COMMUNITY): Payer: Self-pay

## 2022-08-21 DIAGNOSIS — F419 Anxiety disorder, unspecified: Secondary | ICD-10-CM | POA: Diagnosis not present

## 2022-08-21 DIAGNOSIS — F319 Bipolar disorder, unspecified: Secondary | ICD-10-CM | POA: Diagnosis not present

## 2022-08-22 DIAGNOSIS — F339 Major depressive disorder, recurrent, unspecified: Secondary | ICD-10-CM | POA: Diagnosis not present

## 2022-08-22 DIAGNOSIS — F411 Generalized anxiety disorder: Secondary | ICD-10-CM | POA: Diagnosis not present

## 2022-08-23 ENCOUNTER — Other Ambulatory Visit: Payer: 59

## 2022-08-23 ENCOUNTER — Other Ambulatory Visit (HOSPITAL_COMMUNITY): Payer: Self-pay

## 2022-08-27 ENCOUNTER — Other Ambulatory Visit (HOSPITAL_COMMUNITY): Payer: Self-pay

## 2022-08-27 DIAGNOSIS — F419 Anxiety disorder, unspecified: Secondary | ICD-10-CM | POA: Diagnosis not present

## 2022-08-27 DIAGNOSIS — F319 Bipolar disorder, unspecified: Secondary | ICD-10-CM | POA: Diagnosis not present

## 2022-08-27 MED ORDER — ATOMOXETINE HCL 40 MG PO CAPS
40.0000 mg | ORAL_CAPSULE | Freq: Every morning | ORAL | 0 refills | Status: DC
  Filled 2022-08-27: qty 30, 30d supply, fill #0

## 2022-08-27 MED ORDER — PROPRANOLOL HCL 10 MG PO TABS
10.0000 mg | ORAL_TABLET | Freq: Every day | ORAL | 0 refills | Status: DC | PRN
  Filled 2022-08-27: qty 60, 30d supply, fill #0

## 2022-08-29 DIAGNOSIS — F411 Generalized anxiety disorder: Secondary | ICD-10-CM | POA: Diagnosis not present

## 2022-08-29 DIAGNOSIS — F339 Major depressive disorder, recurrent, unspecified: Secondary | ICD-10-CM | POA: Diagnosis not present

## 2022-08-30 DIAGNOSIS — R259 Unspecified abnormal involuntary movements: Secondary | ICD-10-CM | POA: Diagnosis not present

## 2022-08-31 ENCOUNTER — Other Ambulatory Visit (HOSPITAL_COMMUNITY): Payer: Self-pay

## 2022-09-03 ENCOUNTER — Other Ambulatory Visit: Payer: 59

## 2022-09-05 ENCOUNTER — Ambulatory Visit (INDEPENDENT_AMBULATORY_CARE_PROVIDER_SITE_OTHER): Payer: 59

## 2022-09-05 DIAGNOSIS — L603 Nail dystrophy: Secondary | ICD-10-CM

## 2022-09-05 DIAGNOSIS — F411 Generalized anxiety disorder: Secondary | ICD-10-CM | POA: Diagnosis not present

## 2022-09-05 DIAGNOSIS — F339 Major depressive disorder, recurrent, unspecified: Secondary | ICD-10-CM | POA: Diagnosis not present

## 2022-09-05 NOTE — Progress Notes (Signed)
Patient presents today for the 1st  laser treatment. Diagnosed with mycotic nail infection by Dr. Wagoner.   Toenail most affected bilateral hallux .  All other systems are negative.  Nails were filed thin. Laser therapy was administered to bilateral hallux toenails  and patient tolerated the treatment well. All safety precautions were in place.   Single laser pass was done on non-affected nails.   Follow up in 4 weeks for laser # 2.  

## 2022-09-06 DIAGNOSIS — R531 Weakness: Secondary | ICD-10-CM | POA: Diagnosis not present

## 2022-09-06 DIAGNOSIS — R29898 Other symptoms and signs involving the musculoskeletal system: Secondary | ICD-10-CM | POA: Diagnosis not present

## 2022-09-06 DIAGNOSIS — Z9889 Other specified postprocedural states: Secondary | ICD-10-CM | POA: Diagnosis not present

## 2022-09-06 DIAGNOSIS — R269 Unspecified abnormalities of gait and mobility: Secondary | ICD-10-CM | POA: Diagnosis not present

## 2022-09-06 DIAGNOSIS — M25651 Stiffness of right hip, not elsewhere classified: Secondary | ICD-10-CM | POA: Diagnosis not present

## 2022-09-06 DIAGNOSIS — M25551 Pain in right hip: Secondary | ICD-10-CM | POA: Diagnosis not present

## 2022-09-08 ENCOUNTER — Other Ambulatory Visit (HOSPITAL_COMMUNITY): Payer: Self-pay

## 2022-09-10 ENCOUNTER — Other Ambulatory Visit (HOSPITAL_COMMUNITY): Payer: Self-pay

## 2022-09-10 MED ORDER — TOPIRAMATE ER 25 MG PO CAP24
25.0000 mg | ORAL_CAPSULE | Freq: Every day | ORAL | 0 refills | Status: DC
  Filled 2022-09-10: qty 30, 30d supply, fill #0

## 2022-09-11 ENCOUNTER — Other Ambulatory Visit (HOSPITAL_COMMUNITY): Payer: Self-pay

## 2022-09-12 DIAGNOSIS — F411 Generalized anxiety disorder: Secondary | ICD-10-CM | POA: Diagnosis not present

## 2022-09-12 DIAGNOSIS — F339 Major depressive disorder, recurrent, unspecified: Secondary | ICD-10-CM | POA: Diagnosis not present

## 2022-09-13 ENCOUNTER — Other Ambulatory Visit (HOSPITAL_COMMUNITY): Payer: Self-pay

## 2022-09-13 DIAGNOSIS — F319 Bipolar disorder, unspecified: Secondary | ICD-10-CM | POA: Diagnosis not present

## 2022-09-13 DIAGNOSIS — F419 Anxiety disorder, unspecified: Secondary | ICD-10-CM | POA: Diagnosis not present

## 2022-09-13 MED ORDER — TOPIRAMATE ER 25 MG PO SPRINKLE CAP24
75.0000 mg | EXTENDED_RELEASE_CAPSULE | Freq: Every evening | ORAL | 0 refills | Status: DC
  Filled 2022-09-13: qty 90, 30d supply, fill #0

## 2022-09-14 ENCOUNTER — Other Ambulatory Visit (HOSPITAL_COMMUNITY): Payer: Self-pay

## 2022-09-14 DIAGNOSIS — K1370 Unspecified lesions of oral mucosa: Secondary | ICD-10-CM | POA: Diagnosis not present

## 2022-09-14 DIAGNOSIS — I7300 Raynaud's syndrome without gangrene: Secondary | ICD-10-CM | POA: Diagnosis not present

## 2022-09-14 DIAGNOSIS — R1310 Dysphagia, unspecified: Secondary | ICD-10-CM | POA: Diagnosis not present

## 2022-09-14 DIAGNOSIS — D509 Iron deficiency anemia, unspecified: Secondary | ICD-10-CM | POA: Diagnosis not present

## 2022-09-14 DIAGNOSIS — E162 Hypoglycemia, unspecified: Secondary | ICD-10-CM | POA: Diagnosis not present

## 2022-09-14 DIAGNOSIS — R202 Paresthesia of skin: Secondary | ICD-10-CM | POA: Diagnosis not present

## 2022-09-14 DIAGNOSIS — Z9109 Other allergy status, other than to drugs and biological substances: Secondary | ICD-10-CM | POA: Diagnosis not present

## 2022-09-14 MED ORDER — SILDENAFIL CITRATE 20 MG PO TABS
60.0000 mg | ORAL_TABLET | Freq: Every day | ORAL | 3 refills | Status: DC
  Filled 2022-09-21 (×2): qty 90, 30d supply, fill #0
  Filled 2022-10-24: qty 90, 30d supply, fill #1
  Filled 2022-11-26 – 2022-11-28 (×3): qty 90, 30d supply, fill #2
  Filled 2022-12-24: qty 90, 30d supply, fill #3

## 2022-09-17 ENCOUNTER — Other Ambulatory Visit (HOSPITAL_COMMUNITY): Payer: Self-pay

## 2022-09-17 DIAGNOSIS — Z9889 Other specified postprocedural states: Secondary | ICD-10-CM | POA: Diagnosis not present

## 2022-09-17 DIAGNOSIS — G8929 Other chronic pain: Secondary | ICD-10-CM | POA: Diagnosis not present

## 2022-09-17 DIAGNOSIS — M25551 Pain in right hip: Secondary | ICD-10-CM | POA: Diagnosis not present

## 2022-09-17 MED ORDER — METHYLPREDNISOLONE 4 MG PO TBPK
ORAL_TABLET | ORAL | 0 refills | Status: DC
  Filled 2022-09-17: qty 21, 6d supply, fill #0

## 2022-09-19 ENCOUNTER — Other Ambulatory Visit (HOSPITAL_COMMUNITY): Payer: Self-pay

## 2022-09-19 DIAGNOSIS — R531 Weakness: Secondary | ICD-10-CM | POA: Diagnosis not present

## 2022-09-19 DIAGNOSIS — R269 Unspecified abnormalities of gait and mobility: Secondary | ICD-10-CM | POA: Diagnosis not present

## 2022-09-19 DIAGNOSIS — M25551 Pain in right hip: Secondary | ICD-10-CM | POA: Diagnosis not present

## 2022-09-19 DIAGNOSIS — F339 Major depressive disorder, recurrent, unspecified: Secondary | ICD-10-CM | POA: Diagnosis not present

## 2022-09-19 DIAGNOSIS — Z9889 Other specified postprocedural states: Secondary | ICD-10-CM | POA: Diagnosis not present

## 2022-09-19 DIAGNOSIS — F411 Generalized anxiety disorder: Secondary | ICD-10-CM | POA: Diagnosis not present

## 2022-09-19 DIAGNOSIS — M25651 Stiffness of right hip, not elsewhere classified: Secondary | ICD-10-CM | POA: Diagnosis not present

## 2022-09-19 DIAGNOSIS — R29898 Other symptoms and signs involving the musculoskeletal system: Secondary | ICD-10-CM | POA: Diagnosis not present

## 2022-09-20 DIAGNOSIS — M25551 Pain in right hip: Secondary | ICD-10-CM | POA: Diagnosis not present

## 2022-09-20 DIAGNOSIS — Z9889 Other specified postprocedural states: Secondary | ICD-10-CM | POA: Diagnosis not present

## 2022-09-20 DIAGNOSIS — M25851 Other specified joint disorders, right hip: Secondary | ICD-10-CM | POA: Diagnosis not present

## 2022-09-20 DIAGNOSIS — M7061 Trochanteric bursitis, right hip: Secondary | ICD-10-CM | POA: Diagnosis not present

## 2022-09-21 ENCOUNTER — Ambulatory Visit
Admission: RE | Admit: 2022-09-21 | Discharge: 2022-09-21 | Disposition: A | Discharge status: Home / Self Care | Payer: 59 | Source: Ambulatory Visit | Attending: Emergency Medicine | Admitting: Emergency Medicine

## 2022-09-21 ENCOUNTER — Other Ambulatory Visit: Payer: Self-pay

## 2022-09-21 ENCOUNTER — Other Ambulatory Visit (HOSPITAL_COMMUNITY): Payer: Self-pay

## 2022-09-21 ENCOUNTER — Ambulatory Visit (INDEPENDENT_AMBULATORY_CARE_PROVIDER_SITE_OTHER): Payer: 59

## 2022-09-21 VITALS — BP 123/86 | HR 100 | Temp 98.2°F | Resp 16

## 2022-09-21 DIAGNOSIS — F319 Bipolar disorder, unspecified: Secondary | ICD-10-CM | POA: Diagnosis not present

## 2022-09-21 DIAGNOSIS — R1011 Right upper quadrant pain: Secondary | ICD-10-CM | POA: Diagnosis not present

## 2022-09-21 DIAGNOSIS — R109 Unspecified abdominal pain: Secondary | ICD-10-CM | POA: Diagnosis not present

## 2022-09-21 DIAGNOSIS — K5900 Constipation, unspecified: Secondary | ICD-10-CM

## 2022-09-21 DIAGNOSIS — F419 Anxiety disorder, unspecified: Secondary | ICD-10-CM | POA: Diagnosis not present

## 2022-09-21 MED ORDER — TOPIRAMATE ER 25 MG PO SPRINKLE CAP24
75.0000 mg | EXTENDED_RELEASE_CAPSULE | Freq: Every evening | ORAL | 0 refills | Status: DC
  Filled 2022-09-21: qty 21, 7d supply, fill #0

## 2022-09-21 NOTE — Discharge Instructions (Addendum)
The image of your abdomen shows that you have a significant amount of stool and entrapped gas which is the source of your pain at this time.  I recommend that you perform a bowel cleanout either by mixing together Gatorade and MiraLAX, instructions as below, or purchasing and drinking a bottle of magnesium citrate at your pharmacy.  Both will result in a significant movement of your bowels.  Bowel cleanout: Please purchase 32 ounces of your favorite flavor of Gatorade and mix in 250 g of MiraLAX, also known as polyethylene glycol.  It is not important to buy the brand name, the generic version will do.  When you have mixed them together and the MiraLAX powder is completely dissolved, please consume the entire 32 ounces within 1 hour.  Within a few hours, perhaps a little more or a little less, you should feel a significant urge to move your bowels and be able to produce a significant amount of stool.  If you feel you have had significant production of stool there is no need to repeat this process but if you feel that your results have been less than satisfying, you can certainly repeat this process 6 hours after the first round.  Once you have have cleared your bowels, I recommend that you take 1 capful of MiraLAX (17 g) powder daily by mixing it into 8 ounces of your favorite breakfast beverage.  I recommend that you continue doing this for 3 months to retrain your bowels and return your bowel muscle tone to normal.  Thank you for visiting urgent care today.

## 2022-09-21 NOTE — ED Provider Notes (Signed)
[facility]    CSN: [id number] Arrival date & time: 09/21/22  1455    HISTORY   Chief Complaint  Patient presents with   Abdominal Pain    Sharp intermittent pain on both upper left and right abdomen, frequent bowel movements, nausea and fullness upon eating - Entered by patient   HPI Amanda Gill is a pleasant, 27 y.o. female who presents to urgent care today. Patient complains of intermittent episodes of sharp pain in her left and right upper quadrants for the past 2 to 3 days.  Patient states location varies.  Patient states she has noticed increased GI upset including increased flatulence, belching, feeling bloated and more frequent daily bowel movements for the past 2 weeks.  Patient reports being followed by urology currently for increased frequency of urination.  The history is provided by the patient.   History reviewed. No pertinent past medical history. Patient Active Problem List   Diagnosis Date Noted   Symptomatic mammary hypertrophy 10/21/2020   Back pain 10/21/2020   Neck pain 10/21/2020   Past Surgical History:  Procedure Laterality Date   DENTAL SURGERY     HIP SURGERY Bilateral    OB History   No obstetric history on file.    Home Medications    Prior to Admission medications   Medication Sig Start Date End Date Taking? Authorizing Provider  albuterol (VENTOLIN HFA) 108 (90 Base) MCG/ACT inhaler Inhale 1 puff into the lungs every 6 (six) hours. 08/17/22     amLODipine (NORVASC) 5 MG tablet Take 1 tablet (5 mg total) by mouth daily. 07/17/22     atomoxetine (STRATTERA) 40 MG capsule Take 1 capsule (40 mg total) by mouth every morning. 08/27/22     Azelastine HCl 137 MCG/SPRAY SOLN Place 1 puff into both nostrils 2 (two) times daily. 08/17/22     buPROPion (WELLBUTRIN XL) 300 MG 24 hr tablet Take 1 tablet (300 mg total) by mouth every morning. 06/22/22     buPROPion (WELLBUTRIN XL) 300 MG 24 hr tablet Take 1 tablet (300 mg total) by mouth in the  morning. 08/08/22     busPIRone (BUSPAR) 15 MG tablet TAKE 1 TABLET UPTO 3 TIMES A DAY.  MAX 60 MG DAILY. 05/13/18   [provider]  busPIRone (BUSPAR) 30 MG tablet Take 1 tablet (30 mg total) by mouth 2 (two) times daily. 08/08/22     clindamycin (CLEOCIN T) 1 % external solution clindamycin phosphate 1 % topical solution    [provider]  diclofenac (VOLTAREN) 75 MG EC tablet Take 1 tablet (75 mg total) by mouth 2 (two) times daily with a meal. 07/09/22     Efinaconazole 10 % SOLN Apply 1 drop topically daily. 08/25/21   Vivi Barrack, DPM  EPINEPHrine 0.3 mg/0.3 mL IJ SOAJ injection Inject into the muscle. 05/20/18   [provider]  fluconazole (DIFLUCAN) 150 MG tablet take 1 tablet by mouth every 3 days 09/07/20     gabapentin (NEURONTIN) 100 MG capsule Take 1-2 capsules (100-200 mg total) by mouth 2 (two) times daily as needed for anxiety 06/22/22     ipratropium (ATROVENT) 0.06 % nasal spray Use 2 sprays in each nostril 3 times a day 08/04/21     ketoconazole (NIZORAL) 2 % cream Apply to affected area once daily 09/07/20     Levonorgestrel (KYLEENA) 19.5 MG IUD Kyleena 17.5 mcg/24 hrs (56yrs) 19.5mg  intrauterine device  Take 1 device by intrauterine route.  [provider]  lumateperone tosylate (CAPLYTA) 42 MG capsule Take 1 capsule (42 mg total) by mouth at bedtime. 05/02/22     lumateperone tosylate (CAPLYTA) 42 MG capsule Take 1 capsule (42 mg total) by mouth at bedtime. 08/02/22     lurasidone (LATUDA) 40 MG TABS tablet Take 1 tablet (40 mg total) by mouth at bedtime.  (take with at least 350 calories) 04/02/22     meloxicam (MOBIC) 15 MG tablet Take 1 tablet (15 mg total) by mouth once daily 06/13/22     methylPREDNISolone (MEDROL DOSEPAK) 4 MG TBPK tablet Take As Directed On Package 09/17/22     mirabegron ER (MYRBETRIQ) 50 MG TB24 tablet TAKE 1 TABLET BY MOUTH ONCE DAILY. 02/12/22     molnupiravir EUA (LAGEVRIO) 200 MG CAPS capsule Take 4 capsules by  mouth every 12 hours for 5 days 08/04/21     NIFEdipine (ADALAT CC) 60 MG 24 hr tablet Take 1 tablet (60 mg total) by mouth daily. 02/24/21     NONFORMULARY OR COMPOUNDED ITEM [facility]: circulation 07/17/22   Vivi Barrack, DPM  OLANZapine (ZYPREXA) 5 MG tablet Take 1 tablet (5 mg total) by mouth at bedtime. 06/22/22     propranolol (INDERAL) 10 MG tablet Take 1-2 tablets (10-20 mg total) by mouth daily as needed for anxiety 08/27/22     sildenafil (REVATIO) 20 MG tablet Take 1 tablet (20 mg total) by mouth daily. 08/09/22     sildenafil (REVATIO) 20 MG tablet Take 3 tablets (60 mg total) by mouth daily. 09/14/22     topiramate (TOPAMAX) 25 MG tablet Take 3 tablets (75 mg total) by mouth at bedtime. 07/09/22     topiramate ER (QUDEXY XR) 25 MG CS24 sprinkle cap Take 3 capsules (75 mg total) by mouth at bedtime. 09/13/22     topiramate ER (QUDEXY XR) 25 MG CS24 sprinkle cap Take 3 capsules (75 mg total) by mouth at bedtime. 09/21/22     Topiramate ER (TROKENDI XR) 25 MG CP24 Take 1 capsule (25 mg total) by mouth at bedtime. 09/10/22     traMADol (ULTRAM) 50 MG tablet Take 1 tablet (50 mg total) by mouth every 8 (eight) hours as needed for pain 04/12/22     traZODone (DESYREL) 50 MG tablet Take 0.5-1 tablets (25-50 mg total) by mouth at bedtime. 06/22/22     triamcinolone lotion (KENALOG) 0.1 % Apply to affected area 2 times a day for 14 days as directed 09/04/21     venlafaxine XR (EFFEXOR XR) 37.5 MG 24 hr capsule Take 37.5 mg by mouth. Take 1 capsule by mouth every morning    [provider]    Family History History reviewed. No pertinent family history. Social History Social History   Tobacco Use   Smoking status: Never   Smokeless tobacco: Never  Substance Use Topics   Alcohol use: No   Drug use: No   Allergies   Chlorhexidine, Lactose intolerance (gi), and Milk (cow)  Review of Systems Review of Systems Pertinent findings revealed after performing a 14 point review  of systems has been noted in the history of present illness.  Physical Exam Vital Signs BP 123/86 (BP Location: Right Arm)   Pulse 100   Temp 98.2 F (36.8 C) (Oral)   Resp 16   LMP 09/01/2022 (Exact Date)   SpO2 99%   No data found.  Physical Exam Vitals and nursing note reviewed.  Constitutional:      General: She  is not in acute distress.    Appearance: Normal appearance.  HENT:     Head: Normocephalic and atraumatic.  Eyes:     Pupils: Pupils are equal, round, and reactive to light.  Cardiovascular:     Rate and Rhythm: Normal rate and regular rhythm.  Pulmonary:     Effort: Pulmonary effort is normal.     Breath sounds: Normal breath sounds.  Abdominal:     General: Abdomen is protuberant. Bowel sounds are decreased.     Palpations: Abdomen is soft.     Tenderness: There is abdominal tenderness in the right upper quadrant and left upper quadrant.  Musculoskeletal:        General: Normal range of motion.     Cervical back: Normal range of motion and neck supple.  Skin:    General: Skin is warm and dry.  Neurological:     General: No focal deficit present.     Mental Status: She is alert and oriented to person, place, and time. Mental status is at baseline.  Psychiatric:        Mood and Affect: Mood normal.        Behavior: Behavior normal.        Thought Content: Thought content normal.        Judgment: Judgment normal.     Visual Acuity Right Eye Distance:   Left Eye Distance:   Bilateral Distance:    Right Eye Near:   Left Eye Near:    Bilateral Near:     UC Couse / Diagnostics / Procedures:     Radiology DG Abd 1 View  Result Date: 09/21/2022 CLINICAL DATA:  Abdominal pain EXAM: ABDOMEN - 1 VIEW COMPARISON:  None FINDINGS: Bowel gas pattern is nonspecific. Moderate amount of stool is seen in colon. There is no small bowel dilation. Stomach is not distended. Air-fluid level in stomach soleus prior radiographs without signs pneumoperitoneum. IUD is  seen in pelvis. There are multiple surgical screws in left iliac bone. There is marked deformity in right iliac bone, right superior pubic ramus, left superior pubic ramus and ischial tuberosities on both sides. IMPRESSION: Nonspecific bowel gas pattern. Deformities are seen in pelvis on both sides suggesting possible old fractures. IUD is seen in pelvis. Electronically Signed   By: Ernie Avena M.D.   On: 09/21/2022 15:25    Procedures Procedures (including critical care time) EKG  Pending results:  Labs Reviewed - No data to display  Medications Ordered in UC: Medications - No data to display  UC Diagnoses / Final Clinical Impressions(s)   I have reviewed the triage vital signs and the nursing notes.  Pertinent labs & imaging results that were available during my care of the patient were reviewed by me and considered in my medical decision making (see chart for details).    Final diagnoses:  Constipation, unspecified constipation type   Imaging of abdomen shared with patient.  Patient advised to perform bowel cleanout and to begin MiraLAX 1 capful daily.  Return precautions advised.  Please see discharge instructions below for details of plan of care as provided to patient. ED Prescriptions   None    PDMP not reviewed this encounter.  Pending results:  Labs Reviewed - No data to display  Discharge Instructions:   Discharge Instructions      The image of your abdomen shows that you have a significant amount of stool and entrapped gas which is the source of your pain at this time.  I recommend that you perform a bowel cleanout either by mixing together Gatorade and MiraLAX, instructions as below, or purchasing and drinking a bottle of magnesium citrate at your pharmacy.  Both will result in a significant movement of your bowels.  Bowel cleanout: Please purchase 32 ounces of your favorite flavor of Gatorade and mix in 250 g of MiraLAX, also known as polyethylene  glycol.  It is not important to buy the brand name, the generic version will do.  When you have mixed them together and the MiraLAX powder is completely dissolved, please consume the entire 32 ounces within 1 hour.  Within a few hours, perhaps a little more or a little less, you should feel a significant urge to move your bowels and be able to produce a significant amount of stool.  If you feel you have had significant production of stool there is no need to repeat this process but if you feel that your results have been less than satisfying, you can certainly repeat this process 6 hours after the first round.  Once you have have cleared your bowels, I recommend that you take 1 capful of MiraLAX (17 g) powder daily by mixing it into 8 ounces of your favorite breakfast beverage.  I recommend that you continue doing this for 3 months to retrain your bowels and return your bowel muscle tone to normal.  Thank you for visiting urgent care today.       Disposition Upon Discharge:  Condition: stable for discharge home  Patient presented with an acute illness with associated systemic symptoms and significant discomfort requiring urgent management. In my opinion, this is a condition that a prudent lay person (someone who possesses an average knowledge of health and medicine) may potentially expect to result in complications if not addressed urgently such as respiratory distress, impairment of bodily function or dysfunction of bodily organs.   Routine symptom specific, illness specific and/or disease specific instructions were discussed with the patient and/or caregiver at length.   As such, the patient has been evaluated and assessed, work-up was performed and treatment was provided in alignment with urgent care protocols and evidence based medicine.  Patient/parent/caregiver has been advised that the patient may require follow up for further testing and treatment if the symptoms continue in spite of  treatment, as clinically indicated and appropriate.  Patient/parent/caregiver has been advised to return to the [facility] or PCP if no better; to PCP or the Emergency Department if new signs and symptoms develop, or if the current signs or symptoms continue to change or worsen for further workup, evaluation and treatment as clinically indicated and appropriate  The patient will follow up with their current PCP if and as advised. If the patient does not currently have a PCP we will assist them in obtaining one.   The patient may need specialty follow up if the symptoms continue, in spite of conservative treatment and management, for further workup, evaluation, consultation and treatment as clinically indicated and appropriate.  Patient/parent/caregiver verbalized understanding and agreement of plan as discussed.  All questions were addressed during visit.  Please see discharge instructions below for further details of plan.  This office note has been dictated using Teaching laboratory technician.  Unfortunately, this method of dictation can sometimes lead to typographical or grammatical errors.  I apologize for your inconvenience in advance if this occurs.  Please do not hesitate to reach out to me if clarification is needed.      Theadora Rama Scales,  PA-C 09/21/22 1706

## 2022-09-21 NOTE — ED Triage Notes (Signed)
Pt presents to [facility] with co of left sided  rib pain under breast sharp and comes and goes for 2-3 days. Pt reports she is also having right sided pain under right breast for 2 days with gi upset gas, belching, bloating and frequent bowel movements for 2 weeks. New onset of urinary frequency seeing urologist who thinks its bladder spasms.

## 2022-09-22 ENCOUNTER — Other Ambulatory Visit: Payer: Self-pay

## 2022-09-22 ENCOUNTER — Other Ambulatory Visit (HOSPITAL_COMMUNITY): Payer: Self-pay

## 2022-09-22 ENCOUNTER — Emergency Department (HOSPITAL_COMMUNITY): Payer: 59

## 2022-09-22 ENCOUNTER — Emergency Department (HOSPITAL_COMMUNITY)
Admission: EM | Admit: 2022-09-22 | Discharge: 2022-09-22 | Disposition: A | Discharge status: Home / Self Care | Payer: 59 | Attending: Emergency Medicine | Admitting: Emergency Medicine

## 2022-09-22 ENCOUNTER — Encounter (HOSPITAL_COMMUNITY): Payer: Self-pay

## 2022-09-22 DIAGNOSIS — R0602 Shortness of breath: Secondary | ICD-10-CM | POA: Diagnosis not present

## 2022-09-22 DIAGNOSIS — R1013 Epigastric pain: Secondary | ICD-10-CM | POA: Diagnosis not present

## 2022-09-22 DIAGNOSIS — R1032 Left lower quadrant pain: Secondary | ICD-10-CM | POA: Insufficient documentation

## 2022-09-22 DIAGNOSIS — R109 Unspecified abdominal pain: Secondary | ICD-10-CM | POA: Diagnosis not present

## 2022-09-22 DIAGNOSIS — R1011 Right upper quadrant pain: Secondary | ICD-10-CM | POA: Diagnosis not present

## 2022-09-22 HISTORY — DX: Unspecified asthma, uncomplicated: J45.909

## 2022-09-22 LAB — CBG MONITORING, ED
Glucose-Capillary: 84 mg/dL (ref 70–99)
Glucose-Capillary: 81 mg/dL (ref 70–99)

## 2022-09-22 LAB — CBC
HCT: 40.1 % (ref 36.0–46.0)
Hemoglobin: 13.7 g/dL (ref 12.0–15.0)
MCH: 29.7 pg (ref 26.0–34.0)
MCHC: 34.2 g/dL (ref 30.0–36.0)
MCV: 86.8 fL (ref 80.0–100.0)
Platelets: 288 10*3/uL (ref 150–400)
RBC: 4.62 MIL/uL (ref 3.87–5.11)
RDW: 13.2 % (ref 11.5–15.5)
WBC: 6.1 10*3/uL (ref 4.0–10.5)
nRBC: 0 % (ref 0.0–0.2)

## 2022-09-22 LAB — COMPREHENSIVE METABOLIC PANEL
ALT: 12 U/L (ref 0–44)
AST: 22 U/L (ref 15–41)
Albumin: 4.3 g/dL (ref 3.5–5.0)
Alkaline Phosphatase: 48 U/L (ref 38–126)
Anion gap: 12 (ref 5–15)
BUN: 9 mg/dL (ref 6–20)
CO2: 16 mmol/L — ABNORMAL LOW (ref 22–32)
Calcium: 8.9 mg/dL (ref 8.9–10.3)
Chloride: 106 mmol/L (ref 98–111)
Creatinine, Ser: 0.83 mg/dL (ref 0.44–1.00)
GFR, Estimated: >60 mL/min (ref >60)
Glucose, Bld: 94 mg/dL (ref 70–99)
Potassium: 3.6 mmol/L (ref 3.5–5.1)
Sodium: 134 mmol/L — ABNORMAL LOW (ref 135–145)
Total Bilirubin: 1.4 mg/dL — ABNORMAL HIGH (ref 0.3–1.2)
Total Protein: 7.3 g/dL (ref 6.5–8.1)

## 2022-09-22 LAB — URINALYSIS, ROUTINE W REFLEX MICROSCOPIC
Bilirubin Urine: NEGATIVE
Glucose, UA: NEGATIVE mg/dL
Hgb urine dipstick: NEGATIVE
Ketones, ur: NEGATIVE mg/dL
Leukocytes,Ua: NEGATIVE
Nitrite: NEGATIVE
Protein, ur: NEGATIVE mg/dL
Specific Gravity, Urine: 1.002 — ABNORMAL LOW (ref 1.005–1.030)
pH: 7 (ref 5.0–8.0)

## 2022-09-22 LAB — PREGNANCY, URINE: Preg Test, Ur: NEGATIVE

## 2022-09-22 LAB — LIPASE, BLOOD: Lipase: 44 U/L (ref 11–51)

## 2022-09-22 MED ORDER — IOHEXOL 300 MG/ML  SOLN
100.0000 mL | Freq: Once | INTRAMUSCULAR | Status: AC | PRN
  Administered 2022-09-22: 100 mL via INTRAVENOUS

## 2022-09-22 MED ORDER — DICYCLOMINE HCL 20 MG PO TABS: 20.0000 mg | ORAL_TABLET | Freq: Two times a day (BID) | ORAL | 0 refills | Status: DC | PRN

## 2022-09-22 MED ORDER — DICYCLOMINE HCL 10 MG/5ML PO SOLN: 20.0000 mg | Freq: Two times a day (BID) | ORAL | 0 refills | Status: DC | PRN

## 2022-09-22 NOTE — ED Notes (Signed)
Pt called out c/o feeling lightheaded. Pt states that she has episodes of hypoglycemia. CBG and BP checked. Provider notified.

## 2022-09-22 NOTE — Discharge Instructions (Addendum)
Your CT scan was reassuring, as well as your blood pressure.  There were no emergencies identified on your workup today.  You do have some still remaining in the lower colon, and can consider using a suppository if you are still having problems with bowel movements.

## 2022-09-22 NOTE — ED Provider Notes (Signed)
[place] EMERGENCY DEPARTMENT AT [facility] Provider Note   CSN: [id number] Arrival date & time: 09/22/22  1444     History  Chief Complaint  Patient presents with   Abdominal Pain    Amanda Gill is a 27 y.o. female presenting to the ED with abdominal pain.  She reports sharp right upper quadrant and left lower quadrant abdominal pains ongoing for several days.  They occur about every 10 minutes.  She reports she has had constipation issues and she was started on iron about a month ago for iron deficiency anemia.  She reports he is also had a lot of belching and stomach upset.  She was concerned her pain was getting worse.  Denies history of abdominal surgery.  She says she went to urgent care was told that she may be constipated yesterday and prescribed MiraLAX which she took, and she had a fairly small bowel movement afterwards, which is mostly watery.  HPI     Home Medications Prior to Admission medications   Medication Sig Start Date End Date Taking? Authorizing Provider  dicyclomine (BENTYL) 20 MG tablet Take 1 tablet (20 mg total) by mouth 2 (two) times daily as needed for up to 14 doses for spasms. 09/22/22  Yes Martika Egler, Kermit Balo, MD  albuterol (VENTOLIN HFA) 108 (90 Base) MCG/ACT inhaler Inhale 1 puff into the lungs every 6 (six) hours. 08/17/22     amLODipine (NORVASC) 5 MG tablet Take 1 tablet (5 mg total) by mouth daily. 07/17/22     atomoxetine (STRATTERA) 40 MG capsule Take 1 capsule (40 mg total) by mouth every morning. 08/27/22     Azelastine HCl 137 MCG/SPRAY SOLN Place 1 puff into both nostrils 2 (two) times daily. 08/17/22     buPROPion (WELLBUTRIN XL) 300 MG 24 hr tablet Take 1 tablet (300 mg total) by mouth every morning. 06/22/22     buPROPion (WELLBUTRIN XL) 300 MG 24 hr tablet Take 1 tablet (300 mg total) by mouth in the morning. 08/08/22     busPIRone (BUSPAR) 15 MG tablet TAKE 1 TABLET UPTO 3 TIMES A DAY.  MAX 60 MG DAILY. 05/13/18   [provider]  busPIRone (BUSPAR) 30 MG tablet Take 1 tablet (30 mg total) by mouth 2 (two) times daily. 08/08/22     clindamycin (CLEOCIN T) 1 % external solution clindamycin phosphate 1 % topical solution    [provider]  diclofenac (VOLTAREN) 75 MG EC tablet Take 1 tablet (75 mg total) by mouth 2 (two) times daily with a meal. 07/09/22     Efinaconazole 10 % SOLN Apply 1 drop topically daily. 08/25/21   Vivi Barrack, DPM  EPINEPHrine 0.3 mg/0.3 mL IJ SOAJ injection Inject into the muscle. 05/20/18   [provider]  fluconazole (DIFLUCAN) 150 MG tablet take 1 tablet by mouth every 3 days 09/07/20     gabapentin (NEURONTIN) 100 MG capsule Take 1-2 capsules (100-200 mg total) by mouth 2 (two) times daily as needed for anxiety 06/22/22     ipratropium (ATROVENT) 0.06 % nasal spray Use 2 sprays in each nostril 3 times a day 08/04/21     ketoconazole (NIZORAL) 2 % cream Apply to affected area once daily 09/07/20     Levonorgestrel (KYLEENA) 19.5 MG IUD Kyleena 17.5 mcg/24 hrs (61yrs) 19.5mg  intrauterine device  Take 1 device by intrauterine route.    [provider]  lumateperone tosylate (CAPLYTA) 42 MG capsule Take 1 capsule (42 mg  total) by mouth at bedtime. 05/02/22     lumateperone tosylate (CAPLYTA) 42 MG capsule Take 1 capsule (42 mg total) by mouth at bedtime. 08/02/22     lurasidone (LATUDA) 40 MG TABS tablet Take 1 tablet (40 mg total) by mouth at bedtime.  (take with at least 350 calories) 04/02/22     meloxicam (MOBIC) 15 MG tablet Take 1 tablet (15 mg total) by mouth once daily 06/13/22     methylPREDNISolone (MEDROL DOSEPAK) 4 MG TBPK tablet Take As Directed On Package 09/17/22     mirabegron ER (MYRBETRIQ) 50 MG TB24 tablet TAKE 1 TABLET BY MOUTH ONCE DAILY. 02/12/22     molnupiravir EUA (LAGEVRIO) 200 MG CAPS capsule Take 4 capsules by mouth every 12 hours for 5 days 08/04/21     NIFEdipine (ADALAT CC) 60 MG 24 hr tablet Take 1 tablet (60 mg total) by mouth  daily. 02/24/21     NONFORMULARY OR COMPOUNDED ITEM [facility]: circulation 07/17/22   Vivi Barrack, DPM  OLANZapine (ZYPREXA) 5 MG tablet Take 1 tablet (5 mg total) by mouth at bedtime. 06/22/22     propranolol (INDERAL) 10 MG tablet Take 1-2 tablets (10-20 mg total) by mouth daily as needed for anxiety 08/27/22     sildenafil (REVATIO) 20 MG tablet Take 1 tablet (20 mg total) by mouth daily. 08/09/22     sildenafil (REVATIO) 20 MG tablet Take 3 tablets (60 mg total) by mouth daily. 09/14/22     topiramate (TOPAMAX) 25 MG tablet Take 3 tablets (75 mg total) by mouth at bedtime. 07/09/22     topiramate ER (QUDEXY XR) 25 MG CS24 sprinkle cap Take 3 capsules (75 mg total) by mouth at bedtime. 09/13/22     topiramate ER (QUDEXY XR) 25 MG CS24 sprinkle cap Take 3 capsules (75 mg total) by mouth at bedtime. 09/21/22     Topiramate ER (TROKENDI XR) 25 MG CP24 Take 1 capsule (25 mg total) by mouth at bedtime. 09/10/22     traMADol (ULTRAM) 50 MG tablet Take 1 tablet (50 mg total) by mouth every 8 (eight) hours as needed for pain 04/12/22     traZODone (DESYREL) 50 MG tablet Take 0.5-1 tablets (25-50 mg total) by mouth at bedtime. 06/22/22     triamcinolone lotion (KENALOG) 0.1 % Apply to affected area 2 times a day for 14 days as directed 09/04/21     venlafaxine XR (EFFEXOR XR) 37.5 MG 24 hr capsule Take 37.5 mg by mouth. Take 1 capsule by mouth every morning    [provider]      Allergies    Chlorhexidine, Iodine, Lactose intolerance (gi), and Milk (cow)    Review of Systems   Review of Systems  Physical Exam Updated Vital Signs BP 122/84   Pulse 99   Temp 98.6 F (37 C) (Oral)   Resp 19   Ht 5\' 9"  (1.753 m)   Wt 79.4 kg   LMP 09/01/2022 (Exact Date)   SpO2 100%   BMI 25.84 kg/m  Physical Exam Constitutional:      General: She is not in acute distress. HENT:     Head: Normocephalic and atraumatic.  Eyes:     Conjunctiva/sclera: Conjunctivae normal.     Pupils:  Pupils are equal, round, and reactive to light.  Cardiovascular:     Rate and Rhythm: Normal rate and regular rhythm.  Pulmonary:     Effort: Pulmonary effort is normal. No respiratory distress.  Abdominal:  General: There is no distension.     Tenderness: There is abdominal tenderness in the epigastric area. Negative signs include Murphy's sign and McBurney's sign.  Skin:    General: Skin is warm and dry.  Neurological:     General: No focal deficit present.     Mental Status: She is alert. Mental status is at baseline.  Psychiatric:        Mood and Affect: Mood normal.        Behavior: Behavior normal.     ED Results / Procedures / Treatments   Labs (all labs ordered are listed, but only abnormal results are displayed) Labs Reviewed  COMPREHENSIVE METABOLIC PANEL - Abnormal; Notable for the following components:      Result Value   Sodium 134 (*)    CO2 16 (*)    Total Bilirubin 1.4 (*)    All other components within normal limits  URINALYSIS, ROUTINE W REFLEX MICROSCOPIC - Abnormal; Notable for the following components:   Color, Urine STRAW (*)    Specific Gravity, Urine 1.002 (*)    All other components within normal limits  LIPASE, BLOOD  CBC  PREGNANCY, URINE  CBG MONITORING, ED  CBG MONITORING, ED    EKG None  Radiology DG Chest Portable 1 View  Result Date: 09/22/2022 CLINICAL DATA:  Shortness of breath EXAM: PORTABLE CHEST 1 VIEW COMPARISON:  None Available. FINDINGS: The heart size and mediastinal contours are within normal limits. Both lungs are clear. The visualized skeletal structures are unremarkable. IMPRESSION: No active disease. Electronically Signed   By: Jasmine Pang M.D.   On: 09/22/2022 22:54   CT ABDOMEN PELVIS W CONTRAST  Result Date: 09/22/2022 CLINICAL DATA:  Upper abdominal pain EXAM: CT ABDOMEN AND PELVIS WITH CONTRAST TECHNIQUE: Multidetector CT imaging of the abdomen and pelvis was performed using the standard protocol following bolus  administration of intravenous contrast. RADIATION DOSE REDUCTION: This exam was performed according to the departmental dose-optimization program which includes automated exposure control, adjustment of the mA and/or kV according to patient size and/or use of iterative reconstruction technique. CONTRAST:  OMNIPAQUE IOHEXOL 300 MG/ML  SOLN COMPARISON:  Plain film from the previous day. FINDINGS: Lower chest: No acute abnormality. Hepatobiliary: No focal liver abnormality is seen. No gallstones, gallbladder wall thickening, or biliary dilatation. Pancreas: Unremarkable. No pancreatic ductal dilatation or surrounding inflammatory changes. Spleen: Normal in size without focal abnormality. Adrenals/Urinary Tract: Adrenal glands are within normal limits. Kidneys demonstrate a normal enhancement pattern bilaterally. No renal calculi or obstructive changes are seen. The bladder is partially distended. Stomach/Bowel: The appendix is within normal limits. No obstructive or inflammatory changes of the colon are seen. Small bowel and stomach are within normal limits. Vascular/Lymphatic: No significant vascular findings are present. No enlarged abdominal or pelvic lymph nodes. Reproductive: Uterus and bilateral adnexa are unremarkable. IUD is noted in place. Other: No abdominal wall hernia or abnormality. No abdominopelvic ascites. Musculoskeletal: No acute or significant osseous findings. Old fractures are noted in the pelvis with fixation in the left iliac bone. IMPRESSION: No acute abnormality to correspond with the given clinical history. Electronically Signed   By: Alcide Clever M.D.   On: 09/22/2022 21:38   DG Abd 1 View  Result Date: 09/21/2022 CLINICAL DATA:  Abdominal pain EXAM: ABDOMEN - 1 VIEW COMPARISON:  None FINDINGS: Bowel gas pattern is nonspecific. Moderate amount of stool is seen in colon. There is no small bowel dilation. Stomach is not distended. Air-fluid level in stomach  soleus prior radiographs  without signs pneumoperitoneum. IUD is seen in pelvis. There are multiple surgical screws in left iliac bone. There is marked deformity in right iliac bone, right superior pubic ramus, left superior pubic ramus and ischial tuberosities on both sides. IMPRESSION: Nonspecific bowel gas pattern. Deformities are seen in pelvis on both sides suggesting possible old fractures. IUD is seen in pelvis. Electronically Signed   By: Ernie Avena M.D.   On: 09/21/2022 15:25    Procedures Procedures    Medications Ordered in ED Medications  iohexol (OMNIPAQUE) 300 MG/ML solution 100 mL (100 mLs Intravenous Contrast Given 09/22/22 2117)    ED Course/ Medical Decision Making/ A&P Clinical Course as of 09/22/22 2307  Sat Sep 22, 2022  2158 Reports that he had a strange sensation after receiving the iodine contrast, and felt like she was hot all over, difficulty breathing.  Evaluated the patient and she is not having evidence of hives or throat swelling.  I listen to her lungs do not hear any audible wheezing.  She is hyperventilating do not appears extremely anxious.  I was able to calm her by talking to her, and her heart rate improved from 140 bpm to 110 bpm.  X-rays pending.  No emergent findings on CT [MT]    Clinical Course User Index [MT] Tenishia Ekman, Kermit Balo, MD                             Medical Decision Making Amount and/or Complexity of Data Reviewed Labs: ordered. Radiology: ordered.  Risk Prescription drug management.   This patient presents to the ED with concern for abdominal discomfort, sharp pains, nausea. This involves an extensive number of treatment options, and is a complaint that carries with it a high risk of complications and morbidity.  The differential diagnosis includes gastritis vs constipation vs gas pain vs ileus vs biliary disease vs pancreatitis vs ureteral colic vs other   I ordered and personally interpreted labs.  The pertinent results include:  no emergent  findings  I ordered imaging studies including CT abdomen pelvis, DG chest I independently visualized and interpreted imaging which showed no emergent findings of the chest or abdomen I agree with the radiologist interpretation  The patient was maintained on a cardiac monitor.  I personally viewed and interpreted the cardiac monitored which showed an underlying rhythm of: sinus tachycardia and NSR  I have reviewed the patients home medicines and have made adjustments as needed  Test Considered: doubt acute PE clinically; doubt ovarian torsion  After the interventions noted above, I reevaluated the patient and found that they have: stayed the same   Dispostion:  After consideration of the diagnostic results and the patients response to treatment, I feel that the patent would benefit from outpatient follow up.  Patient has GI appointment in June.  I advised gas-x, trial of bentyl for sharp intermittent abdominal pains, which may be gas related, and heating pads on stomach.  Return precautions provided.         Final Clinical Impression(s) / ED Diagnoses Final diagnoses:  Abdominal pain, unspecified abdominal location    Rx / DC Orders ED Discharge Orders          Ordered    dicyclomine (BENTYL) 20 MG tablet  2 times daily PRN        09/22/22 2306              Terald Sleeper,  MD 09/22/22 2310

## 2022-09-22 NOTE — ED Triage Notes (Signed)
Pt to er, pt reports diffuse abd pain for the past two weeks, states that she was seen at urgent care yesterday for the same, states that she was dx with constipation.  States that she continues to have pain, states that the worse is her LUQ pain.  Pt denies vomiting, reports some nausea.  States that she has taken some miralax since yesterday and has had some watery bm

## 2022-09-22 NOTE — ED Notes (Signed)
Pt called out stating her IV site was hurting. Site assessed and seems to be intact. Pt insisted on IV removal for comfort.

## 2022-09-24 ENCOUNTER — Other Ambulatory Visit (HOSPITAL_COMMUNITY): Payer: Self-pay

## 2022-09-25 ENCOUNTER — Other Ambulatory Visit (HOSPITAL_COMMUNITY): Payer: Self-pay

## 2022-09-26 ENCOUNTER — Other Ambulatory Visit (HOSPITAL_COMMUNITY): Payer: Self-pay

## 2022-09-26 DIAGNOSIS — F411 Generalized anxiety disorder: Secondary | ICD-10-CM | POA: Diagnosis not present

## 2022-09-26 DIAGNOSIS — F339 Major depressive disorder, recurrent, unspecified: Secondary | ICD-10-CM | POA: Diagnosis not present

## 2022-09-28 ENCOUNTER — Ambulatory Visit
Admission: RE | Admit: 2022-09-28 | Discharge: 2022-09-28 | Disposition: A | Discharge status: Home / Self Care | Payer: 59 | Source: Ambulatory Visit | Attending: Family Medicine | Admitting: Family Medicine

## 2022-09-28 DIAGNOSIS — E860 Dehydration: Secondary | ICD-10-CM

## 2022-09-28 DIAGNOSIS — R109 Unspecified abdominal pain: Secondary | ICD-10-CM

## 2022-09-28 LAB — POCT URINALYSIS DIP (MANUAL ENTRY)
Bilirubin, UA: NEGATIVE
Glucose, UA: NEGATIVE mg/dL
Ketones, POC UA: NEGATIVE mg/dL
Leukocytes, UA: NEGATIVE
Nitrite, UA: NEGATIVE
Protein Ur, POC: NEGATIVE mg/dL
Spec Grav, UA: >=1.03 — AB (ref 1.010–1.025)
Urobilinogen, UA: 0.2 E.U./dL
pH, UA: 7 (ref 5.0–8.0)

## 2022-09-28 NOTE — ED Triage Notes (Signed)
Patient presents to UC for continued LUQ pain since 04/26. States he was seen at [facility] 04/26 and dx with constipation, seen again at 04/27 for severe pain. States her CT at the ED was normal. Prescribed bentyl and Prilosec. States pain still not getting any better. Taking Miralax, bentyl and Prilosec with minimal relief. Last BM yesterday.

## 2022-09-28 NOTE — ED Provider Notes (Signed)
[facility]    CSN: [id number] Arrival date & time: 09/28/22  1002      History   Chief Complaint Chief Complaint  Patient presents with   Abdominal Pain    Gnawing and sometimes sharp upper abdominal pain since 4/22 that has largely not improved with dicyclomine and Prilosec. Heat provides some comfort. - Entered by patient    HPI Amanda Gill is a 27 y.o. female.   HPI 27 year old female presents with continued left lower quadrant pain since 09/21/22.  Patient was evaluated in the ED with full workup on 09/22/2022-please see epic for that encounter note.  Patient reports was prescribed Bentyl and Prilosec with patient reporting abdominal pain is worsening.  Patient was advised to follow-up with her PCP once discharged from the ED on 09/22/2022 which she has not yet done.  PMH significant for obesity, depression, and weakness.  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   Symptomatic mammary hypertrophy 10/21/2020   Back pain 10/21/2020   Neck pain 10/21/2020   Raynaud's phenomenon 07/08/2020   History of hip surgery 06/06/2019   Acute laryngitis 07/23/2018   Acute maxillary sinusitis 07/23/2018   Fever 07/23/2018   Sore throat 07/23/2018   Allergic reaction to chemical substance 05/20/2018   Bipolar 1 disorder (HCC) 05/20/2018   Anxiety 12/06/2017   Cardiac arrhythmia 12/06/2017   Tachycardia 12/06/2017   Depression 04/26/2017   Allergic rhinitis 09/26/2010    Past Surgical History:  Procedure Laterality Date   DENTAL SURGERY     HIP SURGERY Bilateral      OB History   No obstetric history on file.      Home Medications    Prior to Admission medications   Medication Sig Start Date End Date Taking? Authorizing Provider  albuterol (VENTOLIN HFA) 108 (90 Base) MCG/ACT inhaler Inhale 1 puff into the lungs every 6 (six) hours. 08/17/22     amLODipine (NORVASC) 5 MG tablet Take 1 tablet (5 mg total) by mouth daily. 07/17/22     atomoxetine (STRATTERA) 40 MG capsule Take 1 capsule (40 mg total) by mouth every morning. 08/27/22     Azelastine HCl 137 MCG/SPRAY SOLN Place 1 puff into both nostrils 2 (two) times daily. 08/17/22     buPROPion (WELLBUTRIN XL) 300 MG 24 hr tablet Take 1 tablet (300 mg total) by mouth every morning. 06/22/22     buPROPion (WELLBUTRIN XL) 300 MG 24 hr tablet Take 1 tablet (300 mg total) by mouth in the morning. 08/08/22     busPIRone (BUSPAR) 15 MG tablet TAKE 1 TABLET UPTO 3 TIMES A DAY.  MAX 60 MG DAILY. 05/13/18   [provider]  busPIRone (BUSPAR) 30 MG tablet Take 1 tablet (30 mg total) by mouth 2 (two) times daily. 08/08/22     clindamycin (CLEOCIN T) 1 % external solution clindamycin phosphate 1 % topical solution    [provider]  diclofenac (VOLTAREN) 75 MG EC tablet Take 1 tablet (75 mg total) by mouth 2 (two) times daily with a meal. 07/09/22  dicyclomine (BENTYL) 10 MG/5ML solution Take 10 mLs (20 mg total) by mouth 2 (two) times daily as needed for up to 10 days. 09/22/22 10/02/22  Terald Sleeper, MD  dicyclomine (BENTYL) 20 MG tablet Take 1 tablet (20 mg total) by mouth 2 (two) times daily as needed for up to 14 doses for spasms. 09/22/22   Terald Sleeper, MD  Efinaconazole 10 % SOLN Apply 1 drop topically daily. 08/25/21   Vivi Barrack, DPM  EPINEPHrine 0.3 mg/0.3 mL IJ SOAJ injection Inject into the muscle. 05/20/18   [provider]  fluconazole (DIFLUCAN) 150 MG tablet take 1 tablet by mouth every 3 days 09/07/20     gabapentin (NEURONTIN) 100 MG capsule Take 1-2 capsules  (100-200 mg total) by mouth 2 (two) times daily as needed for anxiety 06/22/22     ipratropium (ATROVENT) 0.06 % nasal spray Use 2 sprays in each nostril 3 times a day 08/04/21     ketoconazole (NIZORAL) 2 % cream Apply to affected area once daily 09/07/20     Levonorgestrel (KYLEENA) 19.5 MG IUD Kyleena 17.5 mcg/24 hrs (67yrs) 19.5mg  intrauterine device  Take 1 device by intrauterine route.    [provider]  lumateperone tosylate (CAPLYTA) 42 MG capsule Take 1 capsule (42 mg total) by mouth at bedtime. 05/02/22     lumateperone tosylate (CAPLYTA) 42 MG capsule Take 1 capsule (42 mg total) by mouth at bedtime. 08/02/22     lurasidone (LATUDA) 40 MG TABS tablet Take 1 tablet (40 mg total) by mouth at bedtime.  (take with at least 350 calories) 04/02/22     meloxicam (MOBIC) 15 MG tablet Take 1 tablet (15 mg total) by mouth once daily 06/13/22     methylPREDNISolone (MEDROL DOSEPAK) 4 MG TBPK tablet Take As Directed On Package 09/17/22     mirabegron ER (MYRBETRIQ) 50 MG TB24 tablet TAKE 1 TABLET BY MOUTH ONCE DAILY. 02/12/22     molnupiravir EUA (LAGEVRIO) 200 MG CAPS capsule Take 4 capsules by mouth every 12 hours for 5 days 08/04/21     NIFEdipine (ADALAT CC) 60 MG 24 hr tablet Take 1 tablet (60 mg total) by mouth daily. 02/24/21     NONFORMULARY OR COMPOUNDED ITEM [facility]: circulation 07/17/22   Vivi Barrack, DPM  OLANZapine (ZYPREXA) 5 MG tablet Take 1 tablet (5 mg total) by mouth at bedtime. 06/22/22     propranolol (INDERAL) 10 MG tablet Take 1-2 tablets (10-20 mg total) by mouth daily as needed for anxiety 08/27/22     sildenafil (REVATIO) 20 MG tablet Take 1 tablet (20 mg total) by mouth daily. 08/09/22     sildenafil (REVATIO) 20 MG tablet Take 3 tablets (60 mg total) by mouth daily. 09/14/22     topiramate (TOPAMAX) 25 MG tablet Take 3 tablets (75 mg total) by mouth at bedtime. 07/09/22     topiramate ER (QUDEXY XR) 25 MG CS24 sprinkle cap Take 3 capsules (75 mg total) by mouth  at bedtime. 09/13/22     topiramate ER (QUDEXY XR) 25 MG CS24 sprinkle cap Take 3 capsules (75 mg total) by mouth at bedtime. 09/21/22     Topiramate ER (TROKENDI XR) 25 MG CP24 Take 1 capsule (25 mg total) by mouth at bedtime. 09/10/22     traMADol (ULTRAM) 50 MG tablet Take 1 tablet (50 mg total) by mouth every 8 (eight) hours as needed for pain 04/12/22     traZODone (DESYREL) 50 MG tablet Take  0.5-1 tablets (25-50 mg total) by mouth at bedtime. 06/22/22     triamcinolone lotion (KENALOG) 0.1 % Apply to affected area 2 times a day for 14 days as directed 09/04/21     venlafaxine XR (EFFEXOR XR) 37.5 MG 24 hr capsule Take 37.5 mg by mouth. Take 1 capsule by mouth every morning    [provider]    Family History History reviewed. No pertinent family history.  Social History Social History   Tobacco Use   Smoking status: Never   Smokeless tobacco: Never  Vaping Use   Vaping Use: Never used  Substance Use Topics   Alcohol use: No   Drug use: No     Allergies   Chlorhexidine, Iodine, Lactose, Lactose intolerance (gi), Milk (cow), and Tilactase   Review of Systems Review of Systems  Gastrointestinal:  Positive for abdominal pain.  All other systems reviewed and are negative.    Physical Exam Triage Vital Signs ED Triage Vitals [09/28/22 1025]  Enc Vitals Group     BP      Pulse      Resp      Temp      Temp src      SpO2      Weight      Height      Head Circumference      Peak Flow      Pain Score 4     Pain Loc      Pain Edu?      Excl. in GC?    No data found.  Updated Vital Signs LMP 09/01/2022 (Exact Date)     Physical Exam Vitals and nursing note reviewed.  Constitutional:      General: She is not in acute distress.    Appearance: She is well-developed. She is obese. She is not ill-appearing, toxic-appearing or diaphoretic.  HENT:     Head: Normocephalic and atraumatic.     Mouth/Throat:     Mouth: Mucous membranes are moist.      Pharynx: Oropharynx is clear.  Eyes:     Extraocular Movements: Extraocular movements intact.     Pupils: Pupils are equal, round, and reactive to light.  Cardiovascular:     Rate and Rhythm: Normal rate and regular rhythm.     Heart sounds: Normal heart sounds.  Pulmonary:     Effort: Pulmonary effort is normal.     Breath sounds: Normal breath sounds. No wheezing, rhonchi or rales.  Abdominal:     General: Abdomen is flat and scaphoid. Bowel sounds are decreased. There is no distension or abdominal bruit. There are no signs of injury.     Palpations: Abdomen is soft. There is no shifting dullness, fluid wave, hepatomegaly, splenomegaly or pulsatile mass.     Tenderness: There is abdominal tenderness in the left lower quadrant. There is no right CVA tenderness, left CVA tenderness, guarding or rebound. Negative signs include Murphy's sign and McBurney's sign.     Hernia: No hernia is present.  Skin:    General: Skin is warm and dry.  Neurological:     General: No focal deficit present.     Mental Status: She is alert and oriented to person, place, and time.  Psychiatric:        Mood and Affect: Mood normal.        Behavior: Behavior normal.      UC Treatments / Results  Labs (all labs ordered are listed, but only abnormal results  are displayed) Labs Reviewed  POCT URINALYSIS DIP (MANUAL ENTRY) - Abnormal; Notable for the following components:      Result Value   Spec Grav, UA >=1.030 (*)    Blood, UA trace-intact (*)    All other components within normal limits  URINE CULTURE    EKG   Radiology No results found.  Procedures Procedures (including critical care time)  Medications Ordered in UC Medications - No data to display  Initial Impression / Assessment and Plan / UC Course  I have reviewed the triage vital signs and the nursing notes.  Pertinent labs & imaging results that were available during my care of the patient were reviewed by me and considered in my  medical decision making (see chart for details).     MDM: 1.  Abdominal pain, unspecified abdominal location-advised patient to follow-up with GI reference provided at time of discharge; 2.  Dehydration-UA revealed above; 3.  Hematuria-urine culture ordered. Advised patient to follow-up with PCP or (GI contact information provided above) for further evaluation of current abdominal pain.  Advised patient to increase daily water intake to 64 ounces per day-UA reveals dehydration today.  Advised we will follow-up with urine culture results once received.  Discharged home, hemodynamically stable.  Patient discharged home, hemodynamically stable. Final Clinical Impressions(s) / UC Diagnoses   Final diagnoses:  Abdominal pain, unspecified abdominal location  Dehydration     Discharge Instructions      Advised patient to follow-up with PCP or (GI contact information provided above) for further evaluation of current abdominal pain.  Advised patient to increase daily water intake to 64 ounces per day-UA reveals dehydration today.  Advised we will follow-up with urine culture results once received.     ED Prescriptions   None    PDMP not reviewed this encounter.   Trevor Iha, FNP 09/28/22 1138

## 2022-09-28 NOTE — Discharge Instructions (Addendum)
Advised patient to follow-up with PCP or (GI contact information provided above) for further evaluation of current abdominal pain.  Advised patient to increase daily water intake to 64 ounces per day-UA reveals dehydration today.  Advised we will follow-up with urine culture results once received.

## 2022-09-29 LAB — URINE CULTURE

## 2022-09-30 LAB — URINE CULTURE: Culture: NO GROWTH

## 2022-10-01 ENCOUNTER — Other Ambulatory Visit (HOSPITAL_COMMUNITY): Payer: Self-pay

## 2022-10-01 ENCOUNTER — Other Ambulatory Visit: Payer: Self-pay

## 2022-10-01 DIAGNOSIS — R1012 Left upper quadrant pain: Secondary | ICD-10-CM | POA: Diagnosis not present

## 2022-10-01 DIAGNOSIS — R1013 Epigastric pain: Secondary | ICD-10-CM | POA: Diagnosis not present

## 2022-10-01 MED ORDER — DICYCLOMINE HCL 10 MG/5ML PO SOLN
ORAL | 0 refills | Status: DC
  Filled 2022-10-01 – 2022-10-02 (×2): qty 3600, 90d supply, fill #0

## 2022-10-01 MED ORDER — SUCRALFATE 1 G PO TABS
ORAL_TABLET | ORAL | 0 refills | Status: DC
  Filled 2022-10-01: qty 360, 90d supply, fill #0

## 2022-10-01 MED ORDER — ESOMEPRAZOLE MAGNESIUM 40 MG PO PACK
PACK | ORAL | 3 refills | Status: DC
  Filled 2022-10-01: qty 90, 90d supply, fill #0

## 2022-10-02 ENCOUNTER — Other Ambulatory Visit: Payer: Self-pay

## 2022-10-02 ENCOUNTER — Other Ambulatory Visit (HOSPITAL_COMMUNITY): Payer: Self-pay

## 2022-10-02 DIAGNOSIS — Z30430 Encounter for insertion of intrauterine contraceptive device: Secondary | ICD-10-CM | POA: Diagnosis not present

## 2022-10-02 DIAGNOSIS — Z30433 Encounter for removal and reinsertion of intrauterine contraceptive device: Secondary | ICD-10-CM | POA: Diagnosis not present

## 2022-10-02 DIAGNOSIS — Z3202 Encounter for pregnancy test, result negative: Secondary | ICD-10-CM | POA: Diagnosis not present

## 2022-10-03 ENCOUNTER — Other Ambulatory Visit (HOSPITAL_COMMUNITY): Payer: Self-pay

## 2022-10-03 ENCOUNTER — Other Ambulatory Visit: Payer: Self-pay

## 2022-10-04 ENCOUNTER — Other Ambulatory Visit: Payer: Self-pay

## 2022-10-04 ENCOUNTER — Other Ambulatory Visit (HOSPITAL_COMMUNITY): Payer: Self-pay

## 2022-10-04 DIAGNOSIS — Z0000 Encounter for general adult medical examination without abnormal findings: Secondary | ICD-10-CM | POA: Diagnosis not present

## 2022-10-04 DIAGNOSIS — R29898 Other symptoms and signs involving the musculoskeletal system: Secondary | ICD-10-CM | POA: Diagnosis not present

## 2022-10-04 DIAGNOSIS — R109 Unspecified abdominal pain: Secondary | ICD-10-CM | POA: Diagnosis not present

## 2022-10-04 DIAGNOSIS — E162 Hypoglycemia, unspecified: Secondary | ICD-10-CM | POA: Insufficient documentation

## 2022-10-04 DIAGNOSIS — J310 Chronic rhinitis: Secondary | ICD-10-CM | POA: Diagnosis not present

## 2022-10-04 DIAGNOSIS — R197 Diarrhea, unspecified: Secondary | ICD-10-CM | POA: Diagnosis not present

## 2022-10-04 DIAGNOSIS — J4530 Mild persistent asthma, uncomplicated: Secondary | ICD-10-CM | POA: Diagnosis not present

## 2022-10-04 DIAGNOSIS — R000 Tachycardia, unspecified: Secondary | ICD-10-CM | POA: Diagnosis not present

## 2022-10-04 DIAGNOSIS — R0600 Dyspnea, unspecified: Secondary | ICD-10-CM | POA: Diagnosis not present

## 2022-10-04 DIAGNOSIS — J45909 Unspecified asthma, uncomplicated: Secondary | ICD-10-CM | POA: Insufficient documentation

## 2022-10-04 DIAGNOSIS — F319 Bipolar disorder, unspecified: Secondary | ICD-10-CM | POA: Diagnosis not present

## 2022-10-04 DIAGNOSIS — R531 Weakness: Secondary | ICD-10-CM | POA: Diagnosis not present

## 2022-10-04 DIAGNOSIS — G2581 Restless legs syndrome: Secondary | ICD-10-CM | POA: Diagnosis not present

## 2022-10-04 DIAGNOSIS — M25551 Pain in right hip: Secondary | ICD-10-CM | POA: Diagnosis not present

## 2022-10-04 DIAGNOSIS — Z9889 Other specified postprocedural states: Secondary | ICD-10-CM | POA: Diagnosis not present

## 2022-10-04 DIAGNOSIS — I7300 Raynaud's syndrome without gangrene: Secondary | ICD-10-CM | POA: Diagnosis not present

## 2022-10-04 DIAGNOSIS — R269 Unspecified abnormalities of gait and mobility: Secondary | ICD-10-CM | POA: Diagnosis not present

## 2022-10-04 DIAGNOSIS — M25651 Stiffness of right hip, not elsewhere classified: Secondary | ICD-10-CM | POA: Diagnosis not present

## 2022-10-04 DIAGNOSIS — R634 Abnormal weight loss: Secondary | ICD-10-CM | POA: Diagnosis not present

## 2022-10-04 DIAGNOSIS — Z91018 Allergy to other foods: Secondary | ICD-10-CM | POA: Diagnosis not present

## 2022-10-04 MED ORDER — AZELASTINE-FLUTICASONE 137-50 MCG/ACT NA SUSP
1.0000 | Freq: Every day | NASAL | 3 refills | Status: DC
  Filled 2022-10-04: qty 23, 30d supply, fill #0

## 2022-10-04 MED ORDER — ALBUTEROL SULFATE HFA 108 (90 BASE) MCG/ACT IN AERS
2.0000 | INHALATION_SPRAY | RESPIRATORY_TRACT | 2 refills | Status: DC | PRN
  Filled 2022-10-04: qty 6.7, 16d supply, fill #0
  Filled 2022-10-24: qty 6.7, 16d supply, fill #1

## 2022-10-04 MED ORDER — FLUTICASONE PROPIONATE HFA 110 MCG/ACT IN AERO
2.0000 | INHALATION_SPRAY | Freq: Two times a day (BID) | RESPIRATORY_TRACT | 3 refills | Status: DC
  Filled 2022-10-04: qty 12, 30d supply, fill #0

## 2022-10-04 MED ORDER — VORTEX VALVED HOLDING CHAMBER DEVI
0 refills | Status: DC
  Filled 2022-10-04: qty 1, 30d supply, fill #0

## 2022-10-05 ENCOUNTER — Other Ambulatory Visit (HOSPITAL_COMMUNITY): Payer: Self-pay

## 2022-10-09 DIAGNOSIS — R1013 Epigastric pain: Secondary | ICD-10-CM | POA: Diagnosis not present

## 2022-10-10 ENCOUNTER — Other Ambulatory Visit (HOSPITAL_COMMUNITY): Payer: Self-pay

## 2022-10-10 DIAGNOSIS — F4310 Post-traumatic stress disorder, unspecified: Secondary | ICD-10-CM | POA: Diagnosis not present

## 2022-10-10 DIAGNOSIS — F319 Bipolar disorder, unspecified: Secondary | ICD-10-CM | POA: Diagnosis not present

## 2022-10-10 DIAGNOSIS — F411 Generalized anxiety disorder: Secondary | ICD-10-CM | POA: Diagnosis not present

## 2022-10-10 DIAGNOSIS — F902 Attention-deficit hyperactivity disorder, combined type: Secondary | ICD-10-CM | POA: Diagnosis not present

## 2022-10-10 DIAGNOSIS — F429 Obsessive-compulsive disorder, unspecified: Secondary | ICD-10-CM | POA: Diagnosis not present

## 2022-10-10 MED ORDER — CAPLYTA 42 MG PO CAPS
42.0000 mg | ORAL_CAPSULE | Freq: Every day | ORAL | 2 refills | Status: DC
  Filled 2022-10-10 – 2022-10-31 (×2): qty 30, 30d supply, fill #0
  Filled 2023-01-25: qty 30, 30d supply, fill #1
  Filled 2023-02-25 – 2023-03-01 (×2): qty 30, 30d supply, fill #2

## 2022-10-10 MED ORDER — FLUTICASONE PROPIONATE 50 MCG/ACT NA SUSP
1.0000 | Freq: Two times a day (BID) | NASAL | 11 refills | Status: DC
  Filled 2022-10-10: qty 16, 30d supply, fill #0

## 2022-10-11 ENCOUNTER — Other Ambulatory Visit (HOSPITAL_COMMUNITY): Payer: Self-pay

## 2022-10-11 MED ORDER — METHYLPHENIDATE HCL 10 MG PO CHEW
10.0000 mg | CHEWABLE_TABLET | Freq: Two times a day (BID) | ORAL | 0 refills | Status: DC
  Filled 2022-10-11 (×2): qty 60, 30d supply, fill #0

## 2022-10-12 ENCOUNTER — Other Ambulatory Visit (HOSPITAL_COMMUNITY): Payer: Self-pay

## 2022-10-12 ENCOUNTER — Ambulatory Visit (INDEPENDENT_AMBULATORY_CARE_PROVIDER_SITE_OTHER): Payer: 59

## 2022-10-12 DIAGNOSIS — B351 Tinea unguium: Secondary | ICD-10-CM

## 2022-10-12 MED ORDER — AEROCHAMBER PLUS FLOW VU MISC
1 refills | Status: AC
  Filled 2022-10-12: qty 1, 30d supply, fill #0

## 2022-10-12 NOTE — Progress Notes (Signed)
Patient presents today for the 2nd laser treatment. Diagnosed with mycotic nail infection by Dr. Ardelle Anton.   Toenail most affected bilateral hallux .  All other systems are negative.  Nails were filed thin. Laser therapy was administered to bilateral hallux toenails  and patient tolerated the treatment well. All safety precautions were in place.   Single laser pass was done on non-affected nails.   Follow up in 4 weeks for laser # 3.

## 2022-10-13 DIAGNOSIS — F411 Generalized anxiety disorder: Secondary | ICD-10-CM | POA: Diagnosis not present

## 2022-10-13 DIAGNOSIS — R059 Cough, unspecified: Secondary | ICD-10-CM | POA: Diagnosis not present

## 2022-10-13 DIAGNOSIS — R0600 Dyspnea, unspecified: Secondary | ICD-10-CM | POA: Diagnosis not present

## 2022-10-13 DIAGNOSIS — R0602 Shortness of breath: Secondary | ICD-10-CM | POA: Diagnosis not present

## 2022-10-13 DIAGNOSIS — F444 Conversion disorder with motor symptom or deficit: Secondary | ICD-10-CM | POA: Diagnosis not present

## 2022-10-13 DIAGNOSIS — F449 Dissociative and conversion disorder, unspecified: Secondary | ICD-10-CM | POA: Diagnosis not present

## 2022-10-16 DIAGNOSIS — E611 Iron deficiency: Secondary | ICD-10-CM | POA: Diagnosis not present

## 2022-10-16 DIAGNOSIS — I7300 Raynaud's syndrome without gangrene: Secondary | ICD-10-CM | POA: Diagnosis not present

## 2022-10-16 DIAGNOSIS — E878 Other disorders of electrolyte and fluid balance, not elsewhere classified: Secondary | ICD-10-CM | POA: Diagnosis not present

## 2022-10-16 DIAGNOSIS — F319 Bipolar disorder, unspecified: Secondary | ICD-10-CM | POA: Diagnosis not present

## 2022-10-16 DIAGNOSIS — R634 Abnormal weight loss: Secondary | ICD-10-CM | POA: Diagnosis not present

## 2022-10-17 DIAGNOSIS — R350 Frequency of micturition: Secondary | ICD-10-CM | POA: Diagnosis not present

## 2022-10-17 DIAGNOSIS — F339 Major depressive disorder, recurrent, unspecified: Secondary | ICD-10-CM | POA: Diagnosis not present

## 2022-10-17 DIAGNOSIS — N3942 Incontinence without sensory awareness: Secondary | ICD-10-CM | POA: Diagnosis not present

## 2022-10-17 DIAGNOSIS — F411 Generalized anxiety disorder: Secondary | ICD-10-CM | POA: Diagnosis not present

## 2022-10-18 DIAGNOSIS — R634 Abnormal weight loss: Secondary | ICD-10-CM | POA: Diagnosis not present

## 2022-10-19 DIAGNOSIS — R150 Incomplete defecation: Secondary | ICD-10-CM | POA: Diagnosis not present

## 2022-10-19 DIAGNOSIS — R1010 Upper abdominal pain, unspecified: Secondary | ICD-10-CM | POA: Diagnosis not present

## 2022-10-19 DIAGNOSIS — R1310 Dysphagia, unspecified: Secondary | ICD-10-CM | POA: Diagnosis not present

## 2022-10-23 ENCOUNTER — Other Ambulatory Visit (HOSPITAL_COMMUNITY): Payer: Self-pay

## 2022-10-23 MED ORDER — MIRABEGRON ER 50 MG PO TB24
50.0000 mg | ORAL_TABLET | Freq: Every day | ORAL | 3 refills | Status: DC
  Filled 2022-10-23: qty 90, 90d supply, fill #0
  Filled 2023-02-05: qty 90, 90d supply, fill #1
  Filled 2023-05-09: qty 90, 90d supply, fill #2
  Filled 2023-08-07: qty 90, 90d supply, fill #3

## 2022-10-24 ENCOUNTER — Other Ambulatory Visit: Payer: Self-pay

## 2022-10-24 ENCOUNTER — Other Ambulatory Visit (HOSPITAL_COMMUNITY): Payer: Self-pay

## 2022-10-24 DIAGNOSIS — F411 Generalized anxiety disorder: Secondary | ICD-10-CM | POA: Diagnosis not present

## 2022-10-24 DIAGNOSIS — F339 Major depressive disorder, recurrent, unspecified: Secondary | ICD-10-CM | POA: Diagnosis not present

## 2022-10-25 DIAGNOSIS — M25561 Pain in right knee: Secondary | ICD-10-CM | POA: Diagnosis not present

## 2022-10-25 DIAGNOSIS — M940 Chondrocostal junction syndrome [Tietze]: Secondary | ICD-10-CM | POA: Diagnosis not present

## 2022-10-25 DIAGNOSIS — K219 Gastro-esophageal reflux disease without esophagitis: Secondary | ICD-10-CM | POA: Diagnosis not present

## 2022-10-25 DIAGNOSIS — I7300 Raynaud's syndrome without gangrene: Secondary | ICD-10-CM | POA: Diagnosis not present

## 2022-10-25 DIAGNOSIS — M25562 Pain in left knee: Secondary | ICD-10-CM | POA: Diagnosis not present

## 2022-10-25 DIAGNOSIS — E611 Iron deficiency: Secondary | ICD-10-CM | POA: Diagnosis not present

## 2022-10-25 DIAGNOSIS — M222X2 Patellofemoral disorders, left knee: Secondary | ICD-10-CM | POA: Diagnosis not present

## 2022-10-25 DIAGNOSIS — G2581 Restless legs syndrome: Secondary | ICD-10-CM | POA: Diagnosis not present

## 2022-10-25 DIAGNOSIS — M222X1 Patellofemoral disorders, right knee: Secondary | ICD-10-CM | POA: Diagnosis not present

## 2022-10-25 DIAGNOSIS — M2480 Other specific joint derangements of unspecified joint, not elsewhere classified: Secondary | ICD-10-CM | POA: Diagnosis not present

## 2022-10-26 ENCOUNTER — Other Ambulatory Visit (HOSPITAL_COMMUNITY): Payer: Self-pay

## 2022-10-31 ENCOUNTER — Other Ambulatory Visit (HOSPITAL_COMMUNITY): Payer: Self-pay

## 2022-10-31 ENCOUNTER — Other Ambulatory Visit: Payer: Self-pay

## 2022-10-31 DIAGNOSIS — R634 Abnormal weight loss: Secondary | ICD-10-CM | POA: Diagnosis not present

## 2022-10-31 DIAGNOSIS — F411 Generalized anxiety disorder: Secondary | ICD-10-CM | POA: Diagnosis not present

## 2022-10-31 DIAGNOSIS — E162 Hypoglycemia, unspecified: Secondary | ICD-10-CM | POA: Diagnosis not present

## 2022-10-31 DIAGNOSIS — F339 Major depressive disorder, recurrent, unspecified: Secondary | ICD-10-CM | POA: Diagnosis not present

## 2022-10-31 DIAGNOSIS — R7989 Other specified abnormal findings of blood chemistry: Secondary | ICD-10-CM | POA: Diagnosis not present

## 2022-11-01 DIAGNOSIS — R1013 Epigastric pain: Secondary | ICD-10-CM | POA: Diagnosis not present

## 2022-11-01 DIAGNOSIS — K2950 Unspecified chronic gastritis without bleeding: Secondary | ICD-10-CM | POA: Diagnosis not present

## 2022-11-01 DIAGNOSIS — Z13810 Encounter for screening for upper gastrointestinal disorder: Secondary | ICD-10-CM | POA: Diagnosis not present

## 2022-11-01 DIAGNOSIS — R1012 Left upper quadrant pain: Secondary | ICD-10-CM | POA: Diagnosis not present

## 2022-11-02 DIAGNOSIS — Z01118 Encounter for examination of ears and hearing with other abnormal findings: Secondary | ICD-10-CM | POA: Diagnosis not present

## 2022-11-02 DIAGNOSIS — H93233 Hyperacusis, bilateral: Secondary | ICD-10-CM | POA: Diagnosis not present

## 2022-11-06 ENCOUNTER — Encounter: Payer: Self-pay | Admitting: Dermatology

## 2022-11-06 ENCOUNTER — Ambulatory Visit (INDEPENDENT_AMBULATORY_CARE_PROVIDER_SITE_OTHER): Payer: 59 | Admitting: Dermatology

## 2022-11-06 DIAGNOSIS — L650 Telogen effluvium: Secondary | ICD-10-CM

## 2022-11-06 DIAGNOSIS — L219 Seborrheic dermatitis, unspecified: Secondary | ICD-10-CM

## 2022-11-06 NOTE — Progress Notes (Signed)
New Patient Visit   Subjective  Amanda Gill is a 27 y.o. female who presents for the following: Hair loss  Patient is here for itchiness and flaky spots in scalp, eyebrows, and face. She has had this problem for 10 plus years. This is well controlled with using Nizoral. She is happy with this OTC treatment but finds it drying.   Patient complains of hair loss. Noticed it the end of February. Patient has adrenal insufficiency that she is being tested for. Cortisol levels are being tested tomorrow. Shedding happened excessively. Whole strands are coming out not just breakage. Patient washes her hair once a week. Uses hair oil with almond and mint oil. Patient has irregular menstrual cycle but is regular with IUD  Patient complains of raised bumps on arm. Present for about 10 years now. Denies pain. Patient would like for it to be looked at. And loss of pigment.   Hyperpigmentation on the arms from previous bumps on the arms.   The following portions of the chart were reviewed this encounter and updated as appropriate: medications, allergies, medical history  Review of Systems:  No other skin or systemic complaints except as noted in HPI or Assessment and Plan.  Objective  Well appearing patient in no apparent distress; mood and affect are within normal limits.  A focused examination was performed of the following areas: Scalp  Relevant exam findings are noted in the Assessment and Plan.    Assessment & Plan  SEBORRHEIC DERMATITIS Exam: Pink patches with greasy scale   Well controlled  Seborrheic Dermatitis is a chronic persistent rash characterized by pinkness and scaling most commonly of the mid face but also can occur on the scalp (dandruff), ears; mid chest, mid back and groin.  It tends to be exacerbated by stress and cooler weather.  People who have neurologic disease may experience new onset or exacerbation of existing seborrheic dermatitis.  The condition is not  curable but treatable and can be controlled.  Treatment Plan: Suggested switching to [employer] Zinc which is less drying. Apply to scalp, let sit for 2-3 minutes then rinse and wash and condition as normal  Don't oil scalp everyday. Apply before shampooing, let sit for a couple hours or overnight then wash   TELOGEN EFFLUVIUM Exam: Diffuse thinning of hair, positive hair pull test.  Telogen effluvium is a benign, self-limited condition causing increased hair shedding usually for several months. It does not progress to baldness, and the hair eventually grows back on its own. It can be triggered by recent illness, recent surgery, thyroid disease, low iron stores, vitamin D deficiency, fad diets or rapid weight loss, hormonal changes such as pregnancy or birth control pills, and some medication. Usually the hair loss starts 2-3 months after the illness or health change. Rarely, it can continue for longer than a year.  Treatment Plan:  Pt recalled having surgery in December (arthroscopic hip sx) where she was put under general anesthesia. Explained to pt that this was most likely her trigger since the hair loss started in 3 months later in March  Reassured her that the shedding will slowly resolve within the next 2-3 months  Treatment includes using OTC supplements- Viviscal and collagen ( Vital Proteins brand was recommended)     Return in about 2 months (around 01/06/2023) for hairloss, skin check.  I, Germaine Pomfret, CMA, am acting as scribe for [employer], DO.   Documentation: I have reviewed the above documentation for accuracy and completeness,  and I agree with the above.  Langston Reusing, DO

## 2022-11-06 NOTE — Patient Instructions (Addendum)
You can find this at [facility]  You can find this at [facility] You can find this at [facility]     Recommended Sunscreen      Due to recent changes in healthcare laws, you may see results of your pathology and/or laboratory studies on MyChart before the doctors have had a chance to review them. We understand that in some cases there may be results that are confusing or concerning to you. Please understand that not all results are received at the same time and often the doctors may need to interpret multiple results in order to provide you with the best plan of care or course of treatment. Therefore, we ask that you please give [place] 2 business days to thoroughly review all your results before contacting the office for clarification. Should we see a critical lab result, you will be contacted sooner.   If You Need Anything After Your Visit  If you have any questions or concerns for your doctor, please call our main line at [phone] If no one answers, please leave a voicemail as directed and we will return your call as soon as possible. Messages left after 4 pm will be answered the following business day.   You may also send [place] a message via MyChart. We typically respond to MyChart messages within 1-2 business days.  For prescription refills, please ask your pharmacy to contact our office. Our fax number is [phone].  If you have an urgent issue when the clinic is closed that cannot wait until the next business day, you can page your doctor at the number below.    Please note that while we do our best to be available for urgent issues outside of office hours, we are not available 24/7.   If you have an urgent issue and are unable to reach [place], you may choose to seek medical care at your doctor's office, retail clinic, urgent care center, or emergency room.  If you have a medical emergency, please immediately call 911 or go to the emergency department. In the event of inclement weather, please  call our main line at [phone] for an update on the status of any delays or closures.  Dermatology Medication Tips: Please keep the boxes that topical medications come in in order to help keep track of the instructions about where and how to use these. Pharmacies typically print the medication instructions only on the boxes and not directly on the medication tubes.   If your medication is too expensive, please contact our office at [phone] or send [place] a message through MyChart.   We are unable to tell what your co-pay for medications will be in advance as this is different depending on your insurance coverage. However, we may be able to find a substitute medication at lower cost or fill out paperwork to get insurance to cover a needed medication.   If a prior authorization is required to get your medication covered by your insurance company, please allow [place] 1-2 business days to complete this process.  Drug prices often vary depending on where the prescription is filled and some pharmacies may offer cheaper prices.  The website www.goodrx.com contains coupons for medications through different pharmacies. The prices here do not account for what the cost may be with help from insurance (it may be cheaper with your insurance), but the website can give you the price if you did not use any insurance.  - You can print the associated coupon and take it with your  prescription to the pharmacy.  - You may also stop by our office during regular business hours and pick up a GoodRx coupon card.  - If you need your prescription sent electronically to a different pharmacy, notify our office through [facility] or by phone at [phone]

## 2022-11-07 DIAGNOSIS — E162 Hypoglycemia, unspecified: Secondary | ICD-10-CM | POA: Diagnosis not present

## 2022-11-07 DIAGNOSIS — F411 Generalized anxiety disorder: Secondary | ICD-10-CM | POA: Diagnosis not present

## 2022-11-07 DIAGNOSIS — R634 Abnormal weight loss: Secondary | ICD-10-CM | POA: Diagnosis not present

## 2022-11-07 DIAGNOSIS — F339 Major depressive disorder, recurrent, unspecified: Secondary | ICD-10-CM | POA: Diagnosis not present

## 2022-11-07 DIAGNOSIS — R7989 Other specified abnormal findings of blood chemistry: Secondary | ICD-10-CM | POA: Diagnosis not present

## 2022-11-09 ENCOUNTER — Other Ambulatory Visit (HOSPITAL_COMMUNITY): Payer: Self-pay

## 2022-11-09 DIAGNOSIS — N939 Abnormal uterine and vaginal bleeding, unspecified: Secondary | ICD-10-CM | POA: Diagnosis not present

## 2022-11-09 DIAGNOSIS — Z30431 Encounter for routine checking of intrauterine contraceptive device: Secondary | ICD-10-CM | POA: Diagnosis not present

## 2022-11-09 DIAGNOSIS — Z30433 Encounter for removal and reinsertion of intrauterine contraceptive device: Secondary | ICD-10-CM | POA: Diagnosis not present

## 2022-11-09 MED ORDER — ETONOGESTREL-ETHINYL ESTRADIOL 0.12-0.015 MG/24HR VA RING
VAGINAL_RING | VAGINAL | 3 refills | Status: DC
  Filled 2022-11-09: qty 3, 84d supply, fill #0

## 2022-11-21 ENCOUNTER — Other Ambulatory Visit (HOSPITAL_COMMUNITY): Payer: Self-pay

## 2022-11-21 DIAGNOSIS — M2550 Pain in unspecified joint: Secondary | ICD-10-CM | POA: Diagnosis not present

## 2022-11-21 DIAGNOSIS — F902 Attention-deficit hyperactivity disorder, combined type: Secondary | ICD-10-CM | POA: Diagnosis not present

## 2022-11-21 DIAGNOSIS — F319 Bipolar disorder, unspecified: Secondary | ICD-10-CM | POA: Diagnosis not present

## 2022-11-21 DIAGNOSIS — M357 Hypermobility syndrome: Secondary | ICD-10-CM | POA: Diagnosis not present

## 2022-11-21 DIAGNOSIS — F429 Obsessive-compulsive disorder, unspecified: Secondary | ICD-10-CM | POA: Diagnosis not present

## 2022-11-21 DIAGNOSIS — I7300 Raynaud's syndrome without gangrene: Secondary | ICD-10-CM | POA: Diagnosis not present

## 2022-11-21 DIAGNOSIS — F411 Generalized anxiety disorder: Secondary | ICD-10-CM | POA: Diagnosis not present

## 2022-11-21 DIAGNOSIS — F4310 Post-traumatic stress disorder, unspecified: Secondary | ICD-10-CM | POA: Diagnosis not present

## 2022-11-21 MED ORDER — CAPLYTA 42 MG PO CAPS
42.0000 mg | ORAL_CAPSULE | Freq: Every day | ORAL | 2 refills | Status: DC
  Filled 2022-11-30: qty 30, 30d supply, fill #0
  Filled 2023-03-25: qty 30, 30d supply, fill #1
  Filled 2023-04-23: qty 30, 30d supply, fill #2

## 2022-11-21 MED ORDER — BUPROPION HCL ER (XL) 300 MG PO TB24
300.0000 mg | ORAL_TABLET | Freq: Every day | ORAL | 1 refills | Status: DC
  Filled 2022-11-21: qty 30, 30d supply, fill #0
  Filled 2022-12-17 – 2023-06-08 (×4): qty 30, 30d supply, fill #1

## 2022-11-22 ENCOUNTER — Other Ambulatory Visit (HOSPITAL_COMMUNITY): Payer: Self-pay

## 2022-11-24 ENCOUNTER — Other Ambulatory Visit (HOSPITAL_COMMUNITY): Payer: Self-pay

## 2022-11-27 ENCOUNTER — Other Ambulatory Visit: Payer: Self-pay

## 2022-11-27 ENCOUNTER — Other Ambulatory Visit (HOSPITAL_COMMUNITY): Payer: Self-pay

## 2022-11-28 ENCOUNTER — Other Ambulatory Visit: Payer: Self-pay

## 2022-11-28 ENCOUNTER — Other Ambulatory Visit (HOSPITAL_COMMUNITY): Payer: Self-pay

## 2022-11-30 ENCOUNTER — Other Ambulatory Visit (HOSPITAL_COMMUNITY): Payer: Self-pay

## 2022-11-30 DIAGNOSIS — R1310 Dysphagia, unspecified: Secondary | ICD-10-CM | POA: Diagnosis not present

## 2022-12-01 ENCOUNTER — Other Ambulatory Visit (HOSPITAL_COMMUNITY): Payer: Self-pay

## 2022-12-03 ENCOUNTER — Other Ambulatory Visit (HOSPITAL_COMMUNITY): Payer: Self-pay

## 2022-12-03 ENCOUNTER — Other Ambulatory Visit: Payer: Self-pay

## 2022-12-04 ENCOUNTER — Telehealth: Payer: 59 | Admitting: Family Medicine

## 2022-12-04 ENCOUNTER — Other Ambulatory Visit (HOSPITAL_COMMUNITY): Payer: Self-pay

## 2022-12-04 ENCOUNTER — Other Ambulatory Visit: Payer: Self-pay

## 2022-12-04 DIAGNOSIS — M549 Dorsalgia, unspecified: Secondary | ICD-10-CM

## 2022-12-04 DIAGNOSIS — R32 Unspecified urinary incontinence: Secondary | ICD-10-CM

## 2022-12-04 MED ORDER — BUPROPION HCL ER (XL) 300 MG PO TB24
300.0000 mg | ORAL_TABLET | Freq: Every morning | ORAL | 0 refills | Status: DC
  Filled 2022-12-04 – 2022-12-14 (×10): qty 90, 90d supply, fill #0

## 2022-12-04 MED ORDER — QUILLICHEW ER 20 MG PO CHER
CHEWABLE_EXTENDED_RELEASE_TABLET | Freq: Every morning | ORAL | 0 refills | Status: DC
  Filled 2022-12-04: qty 15, 30d supply, fill #0

## 2022-12-04 MED ORDER — ALBUTEROL SULFATE HFA 108 (90 BASE) MCG/ACT IN AERS
1.0000 | INHALATION_SPRAY | Freq: Four times a day (QID) | RESPIRATORY_TRACT | 1 refills | Status: AC | PRN
  Filled 2022-12-04: qty 6.7, 50d supply, fill #0

## 2022-12-05 ENCOUNTER — Other Ambulatory Visit (HOSPITAL_COMMUNITY): Payer: Self-pay

## 2022-12-05 NOTE — Progress Notes (Signed)
Based on what you shared with me- the incontinence is not a normal sign and with back pain could signal a nerve issue or another concern that needs immediate attention, I feel your condition warrants further evaluation as soon as possible at an Emergency department.    NOTE: There will be NO CHARGE for this eVisit   If you are having a true medical emergency please call 911.

## 2022-12-06 ENCOUNTER — Other Ambulatory Visit (HOSPITAL_COMMUNITY): Payer: Self-pay

## 2022-12-07 ENCOUNTER — Other Ambulatory Visit (HOSPITAL_COMMUNITY): Payer: Self-pay

## 2022-12-07 ENCOUNTER — Ambulatory Visit (INDEPENDENT_AMBULATORY_CARE_PROVIDER_SITE_OTHER): Payer: 59 | Admitting: Podiatry

## 2022-12-07 ENCOUNTER — Ambulatory Visit (INDEPENDENT_AMBULATORY_CARE_PROVIDER_SITE_OTHER): Payer: 59

## 2022-12-07 ENCOUNTER — Encounter: Payer: Self-pay | Admitting: Podiatry

## 2022-12-07 DIAGNOSIS — M7661 Achilles tendinitis, right leg: Secondary | ICD-10-CM

## 2022-12-07 DIAGNOSIS — F4312 Post-traumatic stress disorder, chronic: Secondary | ICD-10-CM | POA: Diagnosis not present

## 2022-12-07 DIAGNOSIS — M2141 Flat foot [pes planus] (acquired), right foot: Secondary | ICD-10-CM

## 2022-12-07 DIAGNOSIS — B351 Tinea unguium: Secondary | ICD-10-CM

## 2022-12-07 DIAGNOSIS — M7662 Achilles tendinitis, left leg: Secondary | ICD-10-CM | POA: Diagnosis not present

## 2022-12-07 DIAGNOSIS — F429 Obsessive-compulsive disorder, unspecified: Secondary | ICD-10-CM | POA: Diagnosis not present

## 2022-12-07 DIAGNOSIS — M2142 Flat foot [pes planus] (acquired), left foot: Secondary | ICD-10-CM | POA: Diagnosis not present

## 2022-12-07 DIAGNOSIS — F3170 Bipolar disorder, currently in remission, most recent episode unspecified: Secondary | ICD-10-CM | POA: Diagnosis not present

## 2022-12-07 DIAGNOSIS — F902 Attention-deficit hyperactivity disorder, combined type: Secondary | ICD-10-CM | POA: Diagnosis not present

## 2022-12-07 MED ORDER — NORETHINDRONE 0.35 MG PO TABS
1.0000 | ORAL_TABLET | Freq: Every day | ORAL | 3 refills | Status: DC
  Filled 2022-12-07: qty 84, 84d supply, fill #0
  Filled 2022-12-07: qty 28, 28d supply, fill #0
  Filled 2022-12-28 – 2023-02-02 (×6): qty 28, 28d supply, fill #1
  Filled 2023-03-08: qty 28, 28d supply, fill #2
  Filled 2023-04-05: qty 28, 28d supply, fill #3

## 2022-12-07 NOTE — Progress Notes (Unsigned)
Subjective: Chief Complaint  Patient presents with   Foot Pain    Pes planus. Right is worst than the left. Patient has never used inserts. Patient stated when she wears shoes the inserts in her shoes does not fit correctly on her foot.   27 year old female presents the office due to concerns.  She states that she is grossly tried a night splint.  She has had pain to the arch and along her Achilles and then that she feels like her toe "snaps" at times.  Also drank about 10 years and progressively getting worse.  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 No injuries.   Objective: AAO x3, NAD DP/PT pulses palpable bilaterally, CRT less than 3 seconds Patient decreased medial arch upon weightbearing bilaterally.  Ankle, subtalar range of motion intact but any restrictions.  No area of pinpoint tenderness.  Some discomfort over the distal portion Achilles tendon but clinically does not appear to be intact.  Flexor, extensor tendons intact.  MMT 5/5 Right hallux nail is growing back but the left is not progress quickly.  There are still hypertrophic, dystrophic with appears more clear in color. No pain with calf compression, swelling, warmth, erythema  Assessment: Flatfoot deformity with tendinitis; onychomycosis   Plan: -All treatment options discussed with the patient including all alternatives, risks, complications.  -X-rays were obtained reviewed.  3 views of the foot were obtained.  No evidence of acute fracture.  Joint space maintained.  Mild metatarsus adductus present. -We discussed conservative versus surgical treatment options.  Conservatively we discussed she modifications, orthotics.  She was measured for custom orthotics today.  Discussed stretching, rehab exercises to perform daily. -Patient encouraged to call the office with any questions, concerns, change in symptoms.   Vivi Barrack DPM

## 2022-12-07 NOTE — Patient Instructions (Signed)

## 2022-12-07 NOTE — Progress Notes (Signed)
Patient presents today for the 3rd laser treatment. Diagnosed with mycotic nail infection by Dr. Ardelle Anton.   Toenail most affected Bilateral hallux.  All other systems are negative.  Nails were filed thin. Laser therapy was administered to B/L hallux toenails  and patient tolerated the treatment well. All safety precautions were in place.    Follow up in 6 weeks for laser # 4.

## 2022-12-08 ENCOUNTER — Other Ambulatory Visit (HOSPITAL_COMMUNITY): Payer: Self-pay

## 2022-12-10 ENCOUNTER — Other Ambulatory Visit (HOSPITAL_COMMUNITY): Payer: Self-pay

## 2022-12-11 ENCOUNTER — Other Ambulatory Visit: Payer: Self-pay

## 2022-12-12 ENCOUNTER — Other Ambulatory Visit: Payer: Self-pay

## 2022-12-12 ENCOUNTER — Other Ambulatory Visit (HOSPITAL_COMMUNITY): Payer: Self-pay

## 2022-12-13 ENCOUNTER — Other Ambulatory Visit (HOSPITAL_COMMUNITY): Payer: Self-pay

## 2022-12-13 ENCOUNTER — Other Ambulatory Visit: Payer: Self-pay

## 2022-12-13 DIAGNOSIS — N898 Other specified noninflammatory disorders of vagina: Secondary | ICD-10-CM | POA: Diagnosis not present

## 2022-12-13 DIAGNOSIS — R102 Pelvic and perineal pain: Secondary | ICD-10-CM | POA: Diagnosis not present

## 2022-12-13 DIAGNOSIS — B9689 Other specified bacterial agents as the cause of diseases classified elsewhere: Secondary | ICD-10-CM | POA: Diagnosis not present

## 2022-12-13 DIAGNOSIS — Z113 Encounter for screening for infections with a predominantly sexual mode of transmission: Secondary | ICD-10-CM | POA: Diagnosis not present

## 2022-12-13 DIAGNOSIS — R631 Polydipsia: Secondary | ICD-10-CM | POA: Diagnosis not present

## 2022-12-13 DIAGNOSIS — R300 Dysuria: Secondary | ICD-10-CM | POA: Diagnosis not present

## 2022-12-13 DIAGNOSIS — N760 Acute vaginitis: Secondary | ICD-10-CM | POA: Diagnosis not present

## 2022-12-14 ENCOUNTER — Other Ambulatory Visit: Payer: Self-pay

## 2022-12-14 ENCOUNTER — Other Ambulatory Visit (HOSPITAL_COMMUNITY): Payer: Self-pay

## 2022-12-14 DIAGNOSIS — M2550 Pain in unspecified joint: Secondary | ICD-10-CM | POA: Diagnosis not present

## 2022-12-14 DIAGNOSIS — I7300 Raynaud's syndrome without gangrene: Secondary | ICD-10-CM | POA: Diagnosis not present

## 2022-12-14 DIAGNOSIS — M357 Hypermobility syndrome: Secondary | ICD-10-CM | POA: Diagnosis not present

## 2022-12-14 DIAGNOSIS — F319 Bipolar disorder, unspecified: Secondary | ICD-10-CM | POA: Diagnosis not present

## 2022-12-15 ENCOUNTER — Other Ambulatory Visit (HOSPITAL_COMMUNITY): Payer: Self-pay

## 2022-12-17 ENCOUNTER — Ambulatory Visit (INDEPENDENT_AMBULATORY_CARE_PROVIDER_SITE_OTHER): Payer: 59 | Admitting: Dermatology

## 2022-12-17 ENCOUNTER — Other Ambulatory Visit: Payer: Self-pay

## 2022-12-17 ENCOUNTER — Encounter: Payer: Self-pay | Admitting: Dermatology

## 2022-12-17 ENCOUNTER — Other Ambulatory Visit (HOSPITAL_COMMUNITY): Payer: Self-pay

## 2022-12-17 VITALS — BP 100/66 | HR 76

## 2022-12-17 DIAGNOSIS — R61 Generalized hyperhidrosis: Secondary | ICD-10-CM | POA: Diagnosis not present

## 2022-12-17 DIAGNOSIS — L819 Disorder of pigmentation, unspecified: Secondary | ICD-10-CM

## 2022-12-17 DIAGNOSIS — L249 Irritant contact dermatitis, unspecified cause: Secondary | ICD-10-CM

## 2022-12-17 DIAGNOSIS — L74519 Primary focal hyperhidrosis, unspecified: Secondary | ICD-10-CM

## 2022-12-17 DIAGNOSIS — L259 Unspecified contact dermatitis, unspecified cause: Secondary | ICD-10-CM | POA: Diagnosis not present

## 2022-12-17 DIAGNOSIS — L810 Postinflammatory hyperpigmentation: Secondary | ICD-10-CM

## 2022-12-17 MED ORDER — TRIAMCINOLONE ACETONIDE 0.1 % EX CREA
TOPICAL_CREAM | CUTANEOUS | 2 refills | Status: DC
  Filled 2022-12-17: qty 80, 10d supply, fill #0

## 2022-12-17 MED ORDER — QBREXZA 2.4 % EX PADS: MEDICATED_PAD | CUTANEOUS | 3 refills | Status: AC

## 2022-12-17 NOTE — Progress Notes (Signed)
   Follow-Up Visit   Subjective  Amanda Gill is a 27 y.o. female who presents for the following: rash  Patient present today for a rash. Patient was last evaluated on 11/06/22 for seb derm which is much better.   New Issue: She has had a rash under her arms for about 3 1/2 weeks. She had changed deodorant to Smiths about 2 weeks prior to rash starting. She applied Nystatin, prescribed by urgent care, for 2 weeks which did not help. It doesn't itch but it is painful and burns which will persist for several hours then subsides after she washes with DHS or Nizoral.    The following portions of the chart were reviewed this encounter and updated as appropriate: medications, allergies, medical history  Review of Systems:  No other skin or systemic complaints except as noted in HPI or Assessment and Plan.  Objective  Well appearing patient in no apparent distress; mood and affect are within normal limits.  A focused examination was performed of the following areas: Underarms  Relevant exam findings are noted in the Assessment and Plan.  Exam:Hyperpigmented atrophic plaque under both arms             Assessment & Plan    1. Contact Dermatitis - Assessment: Patient presented with a new rash under her arms for three and a half weeks, unresponsive to Nystatin. Physical exam revealed hyperpigmented, slightly erythematous atrophic plaques in the axillary creases, consistent with contact dermatitis. - Plan: Prescribe Triamcinolone ointment to be applied in a thin layer every morning and night for a maximum of 10 days. Consider patch testing in the future to identify specific sensitivities, including nickel, aluminum, dyes, fragrances, and preservatives.  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 - Plan: Trial of Qbrexza prescription wipes with anticholinergic properties to shut off sweat glands. Start  with every other day or every third day application to minimize potential side effects such as dry mouth. Prescription to be filled at [facility] pharmacy. Assess insurance coverage and cost before proceeding.  3. Hyperpigmentation - Assessment: Resulting from contact dermatitis in the axillary creases. - Plan: Triamcinolone ointment should help fade hyperpigmentation. If it does not resolve on its own, consider using a lightening cream after inflammation has subsided.  Follow-up Plan: - Schedule an appointment in three months to assess the effectiveness of Qbrexza and monitor for recurrence of contact dermatitis. Maintain the existing appointment in August for hair concerns and schedule an additional appointment in October for a comprehensive evaluation   No follow-ups on file.  Owens Shark, CMA, am acting as scribe for [employer], DO.   Documentation: I have reviewed the above documentation for accuracy and completeness, and I agree with the above.  Langston Reusing, DO

## 2022-12-17 NOTE — Patient Instructions (Addendum)
Amanda Gill,  Thank you for visiting today. I appreciate your commitment to addressing your new dermatological concerns. Here's a summary of our discussion and the treatment plan:  - Topical Treatment for Irritant Contact Dermatitis:   - Apply Triamcinolone ointment thinly every morning and night for a maximum of 10 days to the affected areas under your arms.  - Future Diagnostic Test:   - Consider undergoing patch testing to identify specific sensitivities that might be causing contact dermatitis.  - Hyperhidrosis Management:   - Start using Qbrexa wipes, prescribed for excessive sweating. Begin application every other day or every third day, based on your tolerance, to avoid side effects like dry mouth.   Milana Na prescriptions will be handled by [facility]; they will contact you regarding insurance coverage and pricing.  - Follow-Up Appointments:   - Continue with the planned appointment in August for hair concerns.   - Schedule another follow-up in October to assess overall progress and manage any recurring issues.  - General Advice:   - Keep using your current deodorants (Dove 0% sensitive and Hume brand) as they are not causing any issues.   - If hyperpigmentation persists, we may consider a lightening cream after all inflammation has subsided.  Please feel free to reach out if you have any questions or concerns about the treatment plan. Looking forward to seeing you in August and October to ensure everything is progressing well.     Dermatitis  Treatment Plan: -Triamcinolone 0.1% cream to underarms twice a day for up to 10 days -Continue Dove deodorant      Due to recent changes in healthcare laws, you may see results of your pathology and/or laboratory studies on MyChart before the doctors have had a chance to review them. We understand that in some cases there may be results that are confusing or concerning to you. Please understand that not all results are received at  the same time and often the doctors may need to interpret multiple results in order to provide you with the best plan of care or course of treatment. Therefore, we ask that you please give [place] 2 business days to thoroughly review all your results before contacting the office for clarification. Should we see a critical lab result, you will be contacted sooner.   If You Need Anything After Your Visit  If you have any questions or concerns for your doctor, please call our main line at [phone] If no one answers, please leave a voicemail as directed and we will return your call as soon as possible. Messages left after 4 pm will be answered the following business day.   You may also send [place] a message via MyChart. We typically respond to MyChart messages within 1-2 business days.  For prescription refills, please ask your pharmacy to contact our office. Our fax number is [phone].  If you have an urgent issue when the clinic is closed that cannot wait until the next business day, you can page your doctor at the number below.    Please note that while we do our best to be available for urgent issues outside of office hours, we are not available 24/7.   If you have an urgent issue and are unable to reach [place], you may choose to seek medical care at your doctor's office, retail clinic, urgent care center, or emergency room.  If you have a medical emergency, please immediately call 911 or go to the emergency department. In the event of inclement  weather, please call our main line at [phone] for an update on the status of any delays or closures.  Dermatology Medication Tips: Please keep the boxes that topical medications come in in order to help keep track of the instructions about where and how to use these. Pharmacies typically print the medication instructions only on the boxes and not directly on the medication tubes.   If your medication is too expensive, please contact our office at  [phone] or send [place] a message through MyChart.   We are unable to tell what your co-pay for medications will be in advance as this is different depending on your insurance coverage. However, we may be able to find a substitute medication at lower cost or fill out paperwork to get insurance to cover a needed medication.   If a prior authorization is required to get your medication covered by your insurance company, please allow [place] 1-2 business days to complete this process.  Drug prices often vary depending on where the prescription is filled and some pharmacies may offer cheaper prices.  The website www.goodrx.com contains coupons for medications through different pharmacies. The prices here do not account for what the cost may be with help from insurance (it may be cheaper with your insurance), but the website can give you the price if you did not use any insurance.  - You can print the associated coupon and take it with your prescription to the pharmacy.  - You may also stop by our office during regular business hours and pick up a GoodRx coupon card.  - If you need your prescription sent electronically to a different pharmacy, notify our office through [facility] or by phone at [phone]

## 2022-12-18 ENCOUNTER — Other Ambulatory Visit: Payer: Self-pay

## 2022-12-19 ENCOUNTER — Other Ambulatory Visit (HOSPITAL_COMMUNITY): Payer: Self-pay

## 2022-12-20 ENCOUNTER — Other Ambulatory Visit (HOSPITAL_COMMUNITY): Payer: Self-pay

## 2022-12-20 ENCOUNTER — Other Ambulatory Visit: Payer: Self-pay

## 2022-12-20 MED ORDER — DRYSOL 20 % EX SOLN
Freq: Every day | CUTANEOUS | 2 refills | Status: DC
  Filled 2022-12-20: qty 60, 30d supply, fill #0
  Filled 2023-01-19 – 2023-02-02 (×3): qty 60, 30d supply, fill #1
  Filled 2023-03-11: qty 60, 30d supply, fill #2

## 2022-12-20 NOTE — Progress Notes (Signed)
Amanda Gill is not covered by insurance. She needs to try and fail Drysol first. Drysol sent in today.

## 2022-12-21 ENCOUNTER — Other Ambulatory Visit (HOSPITAL_COMMUNITY): Payer: Self-pay

## 2022-12-21 ENCOUNTER — Other Ambulatory Visit: Payer: 59

## 2022-12-21 DIAGNOSIS — F4312 Post-traumatic stress disorder, chronic: Secondary | ICD-10-CM | POA: Diagnosis not present

## 2022-12-21 DIAGNOSIS — F902 Attention-deficit hyperactivity disorder, combined type: Secondary | ICD-10-CM | POA: Diagnosis not present

## 2022-12-21 DIAGNOSIS — F429 Obsessive-compulsive disorder, unspecified: Secondary | ICD-10-CM | POA: Diagnosis not present

## 2022-12-21 DIAGNOSIS — F3170 Bipolar disorder, currently in remission, most recent episode unspecified: Secondary | ICD-10-CM | POA: Diagnosis not present

## 2022-12-22 ENCOUNTER — Encounter: Payer: Self-pay | Admitting: Dermatology

## 2022-12-24 ENCOUNTER — Other Ambulatory Visit (HOSPITAL_COMMUNITY): Payer: Self-pay

## 2022-12-24 DIAGNOSIS — F411 Generalized anxiety disorder: Secondary | ICD-10-CM | POA: Diagnosis not present

## 2022-12-24 DIAGNOSIS — F902 Attention-deficit hyperactivity disorder, combined type: Secondary | ICD-10-CM | POA: Diagnosis not present

## 2022-12-24 DIAGNOSIS — F319 Bipolar disorder, unspecified: Secondary | ICD-10-CM | POA: Diagnosis not present

## 2022-12-24 DIAGNOSIS — F4310 Post-traumatic stress disorder, unspecified: Secondary | ICD-10-CM | POA: Diagnosis not present

## 2022-12-24 DIAGNOSIS — F429 Obsessive-compulsive disorder, unspecified: Secondary | ICD-10-CM | POA: Diagnosis not present

## 2022-12-24 MED ORDER — CAPLYTA 42 MG PO CAPS
42.0000 mg | ORAL_CAPSULE | Freq: Every day | ORAL | 2 refills | Status: DC
  Filled 2022-12-24: qty 30, 30d supply, fill #0
  Filled 2023-02-13 – 2023-05-23 (×2): qty 30, 30d supply, fill #1
  Filled 2023-07-16: qty 30, 30d supply, fill #2

## 2022-12-24 MED ORDER — BUPROPION HCL ER (XL) 300 MG PO TB24
300.0000 mg | ORAL_TABLET | Freq: Every day | ORAL | 1 refills | Status: DC
  Filled 2022-12-24 – 2023-07-05 (×4): qty 30, 30d supply, fill #0
  Filled 2023-08-02: qty 30, 30d supply, fill #1

## 2022-12-24 MED ORDER — QUILLICHEW ER 20 MG PO CHER
20.0000 mg | CHEWABLE_EXTENDED_RELEASE_TABLET | Freq: Every morning | ORAL | 0 refills | Status: DC
Start: 2023-01-21 — End: 2023-06-27
  Filled 2023-01-24: qty 30, 30d supply, fill #0

## 2022-12-24 MED ORDER — QUILLICHEW ER 20 MG PO CHER
20.0000 mg | CHEWABLE_EXTENDED_RELEASE_TABLET | Freq: Every morning | ORAL | 0 refills | Status: DC
  Filled 2022-12-24 – 2022-12-27 (×2): qty 15, 15d supply, fill #0

## 2022-12-24 MED ORDER — QUILLICHEW ER 20 MG PO CHER: 20.0000 mg | CHEWABLE_EXTENDED_RELEASE_TABLET | Freq: Every morning | ORAL | 0 refills | Status: DC

## 2022-12-24 NOTE — Telephone Encounter (Signed)
Did we send a PA for this pt's qbrexa?  If not please initiate the PA and list that they are suffering from contact dermatitis in the axilla from clinical strength Deoderant and send clinical notes with PA.  Thanks!

## 2022-12-25 ENCOUNTER — Other Ambulatory Visit (HOSPITAL_COMMUNITY): Payer: Self-pay

## 2022-12-27 ENCOUNTER — Ambulatory Visit: Payer: 59

## 2022-12-27 ENCOUNTER — Other Ambulatory Visit (HOSPITAL_COMMUNITY): Payer: Self-pay

## 2022-12-27 DIAGNOSIS — M2141 Flat foot [pes planus] (acquired), right foot: Secondary | ICD-10-CM

## 2022-12-27 DIAGNOSIS — R09A2 Foreign body sensation, throat: Secondary | ICD-10-CM | POA: Diagnosis not present

## 2022-12-27 DIAGNOSIS — R269 Unspecified abnormalities of gait and mobility: Secondary | ICD-10-CM | POA: Diagnosis not present

## 2022-12-27 DIAGNOSIS — R0989 Other specified symptoms and signs involving the circulatory and respiratory systems: Secondary | ICD-10-CM | POA: Diagnosis not present

## 2022-12-27 DIAGNOSIS — R531 Weakness: Secondary | ICD-10-CM | POA: Diagnosis not present

## 2022-12-27 DIAGNOSIS — R29898 Other symptoms and signs involving the musculoskeletal system: Secondary | ICD-10-CM | POA: Diagnosis not present

## 2022-12-27 DIAGNOSIS — J387 Other diseases of larynx: Secondary | ICD-10-CM | POA: Diagnosis not present

## 2022-12-27 DIAGNOSIS — Z8709 Personal history of other diseases of the respiratory system: Secondary | ICD-10-CM | POA: Diagnosis not present

## 2022-12-27 DIAGNOSIS — M25651 Stiffness of right hip, not elsewhere classified: Secondary | ICD-10-CM | POA: Diagnosis not present

## 2022-12-27 DIAGNOSIS — J383 Other diseases of vocal cords: Secondary | ICD-10-CM | POA: Diagnosis not present

## 2022-12-27 DIAGNOSIS — M25551 Pain in right hip: Secondary | ICD-10-CM | POA: Diagnosis not present

## 2022-12-27 DIAGNOSIS — J385 Laryngeal spasm: Secondary | ICD-10-CM | POA: Diagnosis not present

## 2022-12-27 DIAGNOSIS — L603 Nail dystrophy: Secondary | ICD-10-CM

## 2022-12-27 DIAGNOSIS — R490 Dysphonia: Secondary | ICD-10-CM | POA: Diagnosis not present

## 2022-12-27 DIAGNOSIS — F419 Anxiety disorder, unspecified: Secondary | ICD-10-CM | POA: Diagnosis not present

## 2022-12-27 DIAGNOSIS — Z9889 Other specified postprocedural states: Secondary | ICD-10-CM | POA: Diagnosis not present

## 2022-12-27 DIAGNOSIS — M7661 Achilles tendinitis, right leg: Secondary | ICD-10-CM

## 2022-12-27 NOTE — Progress Notes (Signed)
Patient presents today to pick up custom molded foot orthotics, diagnosed with Pes planus heel spur and PF by Dr. Ardelle Anton.   Orthotics were dispensed and fit was satisfactory. Reviewed instructions for break-in and wear. Written instructions given to patient.  Patient will follow up as needed.   Addison Bailey CPed, CFo, CFm

## 2022-12-28 ENCOUNTER — Other Ambulatory Visit: Payer: Self-pay

## 2022-12-28 DIAGNOSIS — F4312 Post-traumatic stress disorder, chronic: Secondary | ICD-10-CM | POA: Diagnosis not present

## 2022-12-28 DIAGNOSIS — F3170 Bipolar disorder, currently in remission, most recent episode unspecified: Secondary | ICD-10-CM | POA: Diagnosis not present

## 2022-12-28 DIAGNOSIS — F902 Attention-deficit hyperactivity disorder, combined type: Secondary | ICD-10-CM | POA: Diagnosis not present

## 2022-12-28 DIAGNOSIS — F429 Obsessive-compulsive disorder, unspecified: Secondary | ICD-10-CM | POA: Diagnosis not present

## 2023-01-07 ENCOUNTER — Encounter: Payer: Self-pay | Admitting: Dermatology

## 2023-01-07 ENCOUNTER — Other Ambulatory Visit (HOSPITAL_COMMUNITY): Payer: Self-pay

## 2023-01-08 ENCOUNTER — Telehealth: Payer: Self-pay

## 2023-01-08 ENCOUNTER — Other Ambulatory Visit: Payer: Self-pay

## 2023-01-08 DIAGNOSIS — F902 Attention-deficit hyperactivity disorder, combined type: Secondary | ICD-10-CM | POA: Diagnosis not present

## 2023-01-08 DIAGNOSIS — F4312 Post-traumatic stress disorder, chronic: Secondary | ICD-10-CM | POA: Diagnosis not present

## 2023-01-08 DIAGNOSIS — K0499 Other diseases of pulp and periapical tissues: Secondary | ICD-10-CM | POA: Diagnosis not present

## 2023-01-08 DIAGNOSIS — K048 Radicular cyst: Secondary | ICD-10-CM | POA: Diagnosis not present

## 2023-01-08 DIAGNOSIS — F429 Obsessive-compulsive disorder, unspecified: Secondary | ICD-10-CM | POA: Diagnosis not present

## 2023-01-08 DIAGNOSIS — F3170 Bipolar disorder, currently in remission, most recent episode unspecified: Secondary | ICD-10-CM | POA: Diagnosis not present

## 2023-01-08 NOTE — Telephone Encounter (Signed)
Hi Amanda Gill,  Please call pt and let her know the hyperpigmentation will take months to fade. The photos looks similar to the ones we took in the office.  She should continue the triamcinolone 10days on/10 days off until her follow up appointment.  The qbrexa helps with sweating not with odor.  She needs to wash underarms with antibacterial soap such as CeraVe BPO or Panoxyl to kills the bacterial that causes odor.

## 2023-01-08 NOTE — Telephone Encounter (Signed)
Spoke with pt and gave her the advise given by Dr. Onalee Hua and told her we'd re access things at her next visit and that she can call or message [place] with any further questions

## 2023-01-09 ENCOUNTER — Ambulatory Visit: Payer: 59 | Admitting: Dermatology

## 2023-01-11 DIAGNOSIS — F902 Attention-deficit hyperactivity disorder, combined type: Secondary | ICD-10-CM | POA: Diagnosis not present

## 2023-01-11 DIAGNOSIS — F429 Obsessive-compulsive disorder, unspecified: Secondary | ICD-10-CM | POA: Diagnosis not present

## 2023-01-11 DIAGNOSIS — F3170 Bipolar disorder, currently in remission, most recent episode unspecified: Secondary | ICD-10-CM | POA: Diagnosis not present

## 2023-01-11 DIAGNOSIS — F4312 Post-traumatic stress disorder, chronic: Secondary | ICD-10-CM | POA: Diagnosis not present

## 2023-01-17 DIAGNOSIS — M2550 Pain in unspecified joint: Secondary | ICD-10-CM | POA: Insufficient documentation

## 2023-01-17 DIAGNOSIS — R269 Unspecified abnormalities of gait and mobility: Secondary | ICD-10-CM | POA: Diagnosis not present

## 2023-01-17 DIAGNOSIS — M25651 Stiffness of right hip, not elsewhere classified: Secondary | ICD-10-CM | POA: Diagnosis not present

## 2023-01-17 DIAGNOSIS — R0989 Other specified symptoms and signs involving the circulatory and respiratory systems: Secondary | ICD-10-CM | POA: Diagnosis not present

## 2023-01-17 DIAGNOSIS — R29898 Other symptoms and signs involving the musculoskeletal system: Secondary | ICD-10-CM | POA: Diagnosis not present

## 2023-01-17 DIAGNOSIS — R490 Dysphonia: Secondary | ICD-10-CM | POA: Diagnosis not present

## 2023-01-17 DIAGNOSIS — R531 Weakness: Secondary | ICD-10-CM | POA: Diagnosis not present

## 2023-01-17 DIAGNOSIS — Z9889 Other specified postprocedural states: Secondary | ICD-10-CM | POA: Diagnosis not present

## 2023-01-17 DIAGNOSIS — J385 Laryngeal spasm: Secondary | ICD-10-CM | POA: Diagnosis not present

## 2023-01-17 DIAGNOSIS — M25551 Pain in right hip: Secondary | ICD-10-CM | POA: Diagnosis not present

## 2023-01-17 DIAGNOSIS — J383 Other diseases of vocal cords: Secondary | ICD-10-CM | POA: Diagnosis not present

## 2023-01-18 DIAGNOSIS — Z3202 Encounter for pregnancy test, result negative: Secondary | ICD-10-CM | POA: Diagnosis not present

## 2023-01-18 DIAGNOSIS — Z30430 Encounter for insertion of intrauterine contraceptive device: Secondary | ICD-10-CM | POA: Diagnosis not present

## 2023-01-19 ENCOUNTER — Other Ambulatory Visit (HOSPITAL_COMMUNITY): Payer: Self-pay

## 2023-01-22 DIAGNOSIS — M25651 Stiffness of right hip, not elsewhere classified: Secondary | ICD-10-CM | POA: Diagnosis not present

## 2023-01-22 DIAGNOSIS — R490 Dysphonia: Secondary | ICD-10-CM | POA: Diagnosis not present

## 2023-01-22 DIAGNOSIS — J385 Laryngeal spasm: Secondary | ICD-10-CM | POA: Diagnosis not present

## 2023-01-22 DIAGNOSIS — Z9889 Other specified postprocedural states: Secondary | ICD-10-CM | POA: Diagnosis not present

## 2023-01-22 DIAGNOSIS — N3942 Incontinence without sensory awareness: Secondary | ICD-10-CM | POA: Insufficient documentation

## 2023-01-22 DIAGNOSIS — M6289 Other specified disorders of muscle: Secondary | ICD-10-CM | POA: Insufficient documentation

## 2023-01-22 DIAGNOSIS — R29898 Other symptoms and signs involving the musculoskeletal system: Secondary | ICD-10-CM | POA: Diagnosis not present

## 2023-01-22 DIAGNOSIS — R0989 Other specified symptoms and signs involving the circulatory and respiratory systems: Secondary | ICD-10-CM | POA: Diagnosis not present

## 2023-01-22 DIAGNOSIS — J383 Other diseases of vocal cords: Secondary | ICD-10-CM | POA: Diagnosis not present

## 2023-01-22 DIAGNOSIS — R531 Weakness: Secondary | ICD-10-CM | POA: Diagnosis not present

## 2023-01-22 DIAGNOSIS — M25551 Pain in right hip: Secondary | ICD-10-CM | POA: Diagnosis not present

## 2023-01-22 DIAGNOSIS — R269 Unspecified abnormalities of gait and mobility: Secondary | ICD-10-CM | POA: Diagnosis not present

## 2023-01-23 DIAGNOSIS — M25551 Pain in right hip: Secondary | ICD-10-CM | POA: Diagnosis not present

## 2023-01-23 DIAGNOSIS — J383 Other diseases of vocal cords: Secondary | ICD-10-CM | POA: Diagnosis not present

## 2023-01-23 DIAGNOSIS — R0989 Other specified symptoms and signs involving the circulatory and respiratory systems: Secondary | ICD-10-CM | POA: Diagnosis not present

## 2023-01-23 DIAGNOSIS — R269 Unspecified abnormalities of gait and mobility: Secondary | ICD-10-CM | POA: Diagnosis not present

## 2023-01-23 DIAGNOSIS — R490 Dysphonia: Secondary | ICD-10-CM | POA: Diagnosis not present

## 2023-01-23 DIAGNOSIS — R29898 Other symptoms and signs involving the musculoskeletal system: Secondary | ICD-10-CM | POA: Diagnosis not present

## 2023-01-23 DIAGNOSIS — Z9889 Other specified postprocedural states: Secondary | ICD-10-CM | POA: Diagnosis not present

## 2023-01-23 DIAGNOSIS — R531 Weakness: Secondary | ICD-10-CM | POA: Diagnosis not present

## 2023-01-23 DIAGNOSIS — J385 Laryngeal spasm: Secondary | ICD-10-CM | POA: Diagnosis not present

## 2023-01-23 DIAGNOSIS — M25651 Stiffness of right hip, not elsewhere classified: Secondary | ICD-10-CM | POA: Diagnosis not present

## 2023-01-25 ENCOUNTER — Other Ambulatory Visit (HOSPITAL_COMMUNITY): Payer: Self-pay

## 2023-01-29 ENCOUNTER — Other Ambulatory Visit (HOSPITAL_COMMUNITY): Payer: Self-pay

## 2023-01-30 DIAGNOSIS — F429 Obsessive-compulsive disorder, unspecified: Secondary | ICD-10-CM | POA: Diagnosis not present

## 2023-01-30 DIAGNOSIS — F902 Attention-deficit hyperactivity disorder, combined type: Secondary | ICD-10-CM | POA: Diagnosis not present

## 2023-01-30 DIAGNOSIS — F4312 Post-traumatic stress disorder, chronic: Secondary | ICD-10-CM | POA: Diagnosis not present

## 2023-01-30 DIAGNOSIS — F3170 Bipolar disorder, currently in remission, most recent episode unspecified: Secondary | ICD-10-CM | POA: Diagnosis not present

## 2023-01-31 DIAGNOSIS — M25551 Pain in right hip: Secondary | ICD-10-CM | POA: Diagnosis not present

## 2023-01-31 DIAGNOSIS — R269 Unspecified abnormalities of gait and mobility: Secondary | ICD-10-CM | POA: Diagnosis not present

## 2023-01-31 DIAGNOSIS — R29898 Other symptoms and signs involving the musculoskeletal system: Secondary | ICD-10-CM | POA: Diagnosis not present

## 2023-01-31 DIAGNOSIS — J385 Laryngeal spasm: Secondary | ICD-10-CM | POA: Diagnosis not present

## 2023-01-31 DIAGNOSIS — R490 Dysphonia: Secondary | ICD-10-CM | POA: Diagnosis not present

## 2023-01-31 DIAGNOSIS — R0989 Other specified symptoms and signs involving the circulatory and respiratory systems: Secondary | ICD-10-CM | POA: Diagnosis not present

## 2023-01-31 DIAGNOSIS — Z9889 Other specified postprocedural states: Secondary | ICD-10-CM | POA: Diagnosis not present

## 2023-01-31 DIAGNOSIS — R531 Weakness: Secondary | ICD-10-CM | POA: Diagnosis not present

## 2023-01-31 DIAGNOSIS — J383 Other diseases of vocal cords: Secondary | ICD-10-CM | POA: Diagnosis not present

## 2023-01-31 DIAGNOSIS — M25651 Stiffness of right hip, not elsewhere classified: Secondary | ICD-10-CM | POA: Diagnosis not present

## 2023-02-01 ENCOUNTER — Other Ambulatory Visit (HOSPITAL_COMMUNITY): Payer: Self-pay

## 2023-02-04 ENCOUNTER — Other Ambulatory Visit (HOSPITAL_COMMUNITY): Payer: Self-pay

## 2023-02-05 DIAGNOSIS — R490 Dysphonia: Secondary | ICD-10-CM | POA: Diagnosis not present

## 2023-02-05 DIAGNOSIS — J383 Other diseases of vocal cords: Secondary | ICD-10-CM | POA: Diagnosis not present

## 2023-02-05 DIAGNOSIS — R1319 Other dysphagia: Secondary | ICD-10-CM | POA: Diagnosis not present

## 2023-02-05 DIAGNOSIS — R531 Weakness: Secondary | ICD-10-CM | POA: Diagnosis not present

## 2023-02-05 DIAGNOSIS — R0989 Other specified symptoms and signs involving the circulatory and respiratory systems: Secondary | ICD-10-CM | POA: Diagnosis not present

## 2023-02-05 DIAGNOSIS — J385 Laryngeal spasm: Secondary | ICD-10-CM | POA: Diagnosis not present

## 2023-02-05 DIAGNOSIS — Z9889 Other specified postprocedural states: Secondary | ICD-10-CM | POA: Diagnosis not present

## 2023-02-05 DIAGNOSIS — R29898 Other symptoms and signs involving the musculoskeletal system: Secondary | ICD-10-CM | POA: Diagnosis not present

## 2023-02-05 DIAGNOSIS — M25651 Stiffness of right hip, not elsewhere classified: Secondary | ICD-10-CM | POA: Diagnosis not present

## 2023-02-05 DIAGNOSIS — R269 Unspecified abnormalities of gait and mobility: Secondary | ICD-10-CM | POA: Diagnosis not present

## 2023-02-05 DIAGNOSIS — M25551 Pain in right hip: Secondary | ICD-10-CM | POA: Diagnosis not present

## 2023-02-05 DIAGNOSIS — M791 Myalgia, unspecified site: Secondary | ICD-10-CM | POA: Insufficient documentation

## 2023-02-06 DIAGNOSIS — R7989 Other specified abnormal findings of blood chemistry: Secondary | ICD-10-CM | POA: Diagnosis not present

## 2023-02-06 DIAGNOSIS — E162 Hypoglycemia, unspecified: Secondary | ICD-10-CM | POA: Diagnosis not present

## 2023-02-06 DIAGNOSIS — Z0110 Encounter for examination of ears and hearing without abnormal findings: Secondary | ICD-10-CM | POA: Diagnosis not present

## 2023-02-06 DIAGNOSIS — F4312 Post-traumatic stress disorder, chronic: Secondary | ICD-10-CM | POA: Diagnosis not present

## 2023-02-06 DIAGNOSIS — J383 Other diseases of vocal cords: Secondary | ICD-10-CM | POA: Diagnosis not present

## 2023-02-06 DIAGNOSIS — I951 Orthostatic hypotension: Secondary | ICD-10-CM | POA: Diagnosis not present

## 2023-02-06 DIAGNOSIS — F429 Obsessive-compulsive disorder, unspecified: Secondary | ICD-10-CM | POA: Diagnosis not present

## 2023-02-06 DIAGNOSIS — E249 Cushing's syndrome, unspecified: Secondary | ICD-10-CM | POA: Diagnosis not present

## 2023-02-06 DIAGNOSIS — F902 Attention-deficit hyperactivity disorder, combined type: Secondary | ICD-10-CM | POA: Diagnosis not present

## 2023-02-06 DIAGNOSIS — J4530 Mild persistent asthma, uncomplicated: Secondary | ICD-10-CM | POA: Diagnosis not present

## 2023-02-06 DIAGNOSIS — J310 Chronic rhinitis: Secondary | ICD-10-CM | POA: Diagnosis not present

## 2023-02-06 DIAGNOSIS — F3170 Bipolar disorder, currently in remission, most recent episode unspecified: Secondary | ICD-10-CM | POA: Diagnosis not present

## 2023-02-06 DIAGNOSIS — R0600 Dyspnea, unspecified: Secondary | ICD-10-CM | POA: Diagnosis not present

## 2023-02-06 DIAGNOSIS — H93233 Hyperacusis, bilateral: Secondary | ICD-10-CM | POA: Diagnosis not present

## 2023-02-07 ENCOUNTER — Other Ambulatory Visit (HOSPITAL_COMMUNITY): Payer: Self-pay

## 2023-02-07 DIAGNOSIS — I951 Orthostatic hypotension: Secondary | ICD-10-CM | POA: Diagnosis not present

## 2023-02-07 DIAGNOSIS — F319 Bipolar disorder, unspecified: Secondary | ICD-10-CM | POA: Diagnosis not present

## 2023-02-07 DIAGNOSIS — I7300 Raynaud's syndrome without gangrene: Secondary | ICD-10-CM | POA: Diagnosis not present

## 2023-02-07 DIAGNOSIS — Z23 Encounter for immunization: Secondary | ICD-10-CM | POA: Diagnosis not present

## 2023-02-07 DIAGNOSIS — E611 Iron deficiency: Secondary | ICD-10-CM | POA: Diagnosis not present

## 2023-02-07 MED ORDER — SILDENAFIL CITRATE 20 MG PO TABS
ORAL_TABLET | ORAL | 0 refills | Status: DC
  Filled 2023-02-07: qty 90, 30d supply, fill #0

## 2023-02-08 ENCOUNTER — Other Ambulatory Visit (HOSPITAL_COMMUNITY): Payer: Self-pay

## 2023-02-08 DIAGNOSIS — R0989 Other specified symptoms and signs involving the circulatory and respiratory systems: Secondary | ICD-10-CM | POA: Diagnosis not present

## 2023-02-08 DIAGNOSIS — J383 Other diseases of vocal cords: Secondary | ICD-10-CM | POA: Diagnosis not present

## 2023-02-08 DIAGNOSIS — M25651 Stiffness of right hip, not elsewhere classified: Secondary | ICD-10-CM | POA: Diagnosis not present

## 2023-02-08 DIAGNOSIS — Z9889 Other specified postprocedural states: Secondary | ICD-10-CM | POA: Diagnosis not present

## 2023-02-08 DIAGNOSIS — R29898 Other symptoms and signs involving the musculoskeletal system: Secondary | ICD-10-CM | POA: Diagnosis not present

## 2023-02-08 DIAGNOSIS — J385 Laryngeal spasm: Secondary | ICD-10-CM | POA: Diagnosis not present

## 2023-02-08 DIAGNOSIS — R531 Weakness: Secondary | ICD-10-CM | POA: Diagnosis not present

## 2023-02-08 DIAGNOSIS — M25551 Pain in right hip: Secondary | ICD-10-CM | POA: Diagnosis not present

## 2023-02-08 DIAGNOSIS — R490 Dysphonia: Secondary | ICD-10-CM | POA: Diagnosis not present

## 2023-02-08 DIAGNOSIS — R269 Unspecified abnormalities of gait and mobility: Secondary | ICD-10-CM | POA: Diagnosis not present

## 2023-02-13 DIAGNOSIS — R269 Unspecified abnormalities of gait and mobility: Secondary | ICD-10-CM | POA: Diagnosis not present

## 2023-02-13 DIAGNOSIS — F4312 Post-traumatic stress disorder, chronic: Secondary | ICD-10-CM | POA: Diagnosis not present

## 2023-02-13 DIAGNOSIS — R29898 Other symptoms and signs involving the musculoskeletal system: Secondary | ICD-10-CM | POA: Diagnosis not present

## 2023-02-13 DIAGNOSIS — J383 Other diseases of vocal cords: Secondary | ICD-10-CM | POA: Diagnosis not present

## 2023-02-13 DIAGNOSIS — F902 Attention-deficit hyperactivity disorder, combined type: Secondary | ICD-10-CM | POA: Diagnosis not present

## 2023-02-13 DIAGNOSIS — M25651 Stiffness of right hip, not elsewhere classified: Secondary | ICD-10-CM | POA: Diagnosis not present

## 2023-02-13 DIAGNOSIS — R490 Dysphonia: Secondary | ICD-10-CM | POA: Diagnosis not present

## 2023-02-13 DIAGNOSIS — F429 Obsessive-compulsive disorder, unspecified: Secondary | ICD-10-CM | POA: Diagnosis not present

## 2023-02-13 DIAGNOSIS — F3170 Bipolar disorder, currently in remission, most recent episode unspecified: Secondary | ICD-10-CM | POA: Diagnosis not present

## 2023-02-13 DIAGNOSIS — Z9889 Other specified postprocedural states: Secondary | ICD-10-CM | POA: Diagnosis not present

## 2023-02-13 DIAGNOSIS — M25551 Pain in right hip: Secondary | ICD-10-CM | POA: Diagnosis not present

## 2023-02-13 DIAGNOSIS — R531 Weakness: Secondary | ICD-10-CM | POA: Diagnosis not present

## 2023-02-13 DIAGNOSIS — J385 Laryngeal spasm: Secondary | ICD-10-CM | POA: Diagnosis not present

## 2023-02-13 DIAGNOSIS — R0989 Other specified symptoms and signs involving the circulatory and respiratory systems: Secondary | ICD-10-CM | POA: Diagnosis not present

## 2023-02-15 ENCOUNTER — Other Ambulatory Visit (HOSPITAL_COMMUNITY): Payer: Self-pay

## 2023-02-18 ENCOUNTER — Other Ambulatory Visit: Payer: Self-pay

## 2023-02-18 ENCOUNTER — Other Ambulatory Visit (HOSPITAL_COMMUNITY): Payer: Self-pay

## 2023-02-18 DIAGNOSIS — F429 Obsessive-compulsive disorder, unspecified: Secondary | ICD-10-CM | POA: Diagnosis not present

## 2023-02-18 DIAGNOSIS — F319 Bipolar disorder, unspecified: Secondary | ICD-10-CM | POA: Diagnosis not present

## 2023-02-18 DIAGNOSIS — F4310 Post-traumatic stress disorder, unspecified: Secondary | ICD-10-CM | POA: Diagnosis not present

## 2023-02-18 DIAGNOSIS — F902 Attention-deficit hyperactivity disorder, combined type: Secondary | ICD-10-CM | POA: Diagnosis not present

## 2023-02-18 DIAGNOSIS — F411 Generalized anxiety disorder: Secondary | ICD-10-CM | POA: Diagnosis not present

## 2023-02-18 MED ORDER — QUILLICHEW ER 30 MG PO CHER
30.0000 mg | CHEWABLE_EXTENDED_RELEASE_TABLET | Freq: Every morning | ORAL | 0 refills | Status: DC
  Filled 2023-05-07: qty 30, 30d supply, fill #0

## 2023-02-18 MED ORDER — METHYLPHENIDATE HCL 10 MG PO CHEW: 15.0000 mg | CHEWABLE_TABLET | Freq: Every day | ORAL | 0 refills | Status: DC

## 2023-02-18 MED ORDER — QUILLICHEW ER 30 MG PO CHER
30.0000 mg | CHEWABLE_EXTENDED_RELEASE_TABLET | Freq: Every morning | ORAL | 0 refills | Status: DC
Start: 2023-02-18 — End: 2023-06-27
  Filled 2023-02-18: qty 30, 30d supply, fill #0

## 2023-02-18 MED ORDER — QUILLICHEW ER 30 MG PO CHER
30.0000 mg | CHEWABLE_EXTENDED_RELEASE_TABLET | Freq: Every morning | ORAL | 0 refills | Status: DC
  Filled 2023-04-01: qty 30, 30d supply, fill #0

## 2023-02-18 MED ORDER — METHYLPHENIDATE HCL 10 MG PO CHEW
15.0000 mg | CHEWABLE_TABLET | Freq: Every day | ORAL | 0 refills | Status: DC
Start: 2023-02-18 — End: 2023-06-27
  Filled 2023-02-18 (×2): qty 45, 30d supply, fill #0

## 2023-02-19 ENCOUNTER — Other Ambulatory Visit: Payer: Self-pay

## 2023-02-20 ENCOUNTER — Ambulatory Visit: Payer: 59 | Admitting: Dermatology

## 2023-02-20 ENCOUNTER — Encounter: Payer: Self-pay | Admitting: Dermatology

## 2023-02-20 DIAGNOSIS — B079 Viral wart, unspecified: Secondary | ICD-10-CM | POA: Diagnosis not present

## 2023-02-20 DIAGNOSIS — L649 Androgenic alopecia, unspecified: Secondary | ICD-10-CM

## 2023-02-20 DIAGNOSIS — L650 Telogen effluvium: Secondary | ICD-10-CM | POA: Diagnosis not present

## 2023-02-20 MED ORDER — SAFETY SEAL MISCELLANEOUS MISC: 1.0000 | Freq: Every morning | 3 refills | Status: DC

## 2023-02-20 NOTE — Patient Instructions (Addendum)
Hello Amanda Gill ,  Thank you for visiting our clinic today. We appreciate your commitment to addressing your health concerns and are here to support you in improving your overall well-being. Here is a summary of the key instructions and recommendations from today's consultation:  - Topical Treatment for Hair Loss:   - Prescription: A compounded medication including minoxidil, finasteride, and spironolactone has been prescribed. [facility] will contact you for payment and shipping details.   - Application: Apply a thin layer of the medication to the affected areas of your scalp using a cotton ball or your hands. Ensure to wash your hands after application.   - Areas to Focus: Use the medication as directed, focusing on the frontal scalp and middle part areas.  - Wart Treatment:   - Procedure: The wart on your skin was treated today using cryotherapy (liquid nitrogen).   - Care Instructions: Keep the treated area covered until it heals completely. We may need to treat it again if the wart is not gone by your next visit.  - Follow-Up Appointments:   - Schedule: Please return in three months for a follow-up on your hair treatment and a full body examination.  - Future Services:   - Patch Testing: We discussed the possibility of patch testing at our clinic. We will notify you when this service becomes available.  Please feel free to reach out if you have any questions or concerns regarding your treatment plan. We look forward to seeing you again and assisting you further in your health journey.  Best regards,  Dr. [facility] Reusing [facility]    Your provider has sent your prescription to [facility] in [place], [place]. A pharmacy representative will call you to confirm details and take your payment information. If you do not receive a call within 24 hours, please contact the pharmacy at [phone] or [phone]. Your unique skincare compound is being formulated in our lab  (most compounds take less than 24 hours). Your prescription is shipped vis USPS to your mailbox (2-4 business days). Priority shipping is available at an additional cost. Once received, you will electronically sign/acknowledge that you received your prescription. The pharmacy hours are Monday-Friday 9 am-6 pm EST and Saturday 9 am-1 pm EST.     Cryotherapy Aftercare  Wash gently with soap and water everyday.   Apply Vaseline and Band-Aid daily until healed.

## 2023-02-20 NOTE — Progress Notes (Signed)
   Follow-Up Visit   Subjective  Amanda Gill is a 27 y.o. female who presents for the following: Follow up for teolgen effluvium - she is taking Viviscal and a collagen supplement. She was improving but she noticed shedding getting worse again 2 weeks ago. Around the same time, she noticed the same symptoms of adrenal insufficiency again. She has an appointment with a new endocrinologist 03/05/2023. She is using DHS shampoo and thinks it is really helpful. She has been diagnosed with PCOS.'  Of note, pt also states that 4 months ago she had her IUD removed.     The following portions of the chart were reviewed this encounter and updated as appropriate: medications, allergies, medical history  Review of Systems:  No other skin or systemic complaints except as noted in HPI or Assessment and Plan.  Objective  Well appearing patient in no apparent distress; mood and affect are within normal limits.   A focused examination was performed of the following areas:   Relevant exam findings are noted in the Assessment and Plan.  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   Treatment Plan: -Reassured pt of self limiting nature of this form of hairloss, most likely contributing factor was the removal of her IUD 3.5 months ago -Continue Viviscal and Collagen suppplement   ANDROGENETIC ALOPECIA (FEMALE PATTERN HAIR LOSS) Exam: Diffuse thinning of the crown and widening of the midline part with retention of the frontal hairline   Female Androgenic Alopecia is a chronic condition related to genetics and/or hormonal changes.  In women androgenetic alopecia is commonly associated with menopause but may occur any time after puberty.  It causes hair thinning primarily on the crown with widening of the part and temporal hairline recession.  Treatment Plan: -Start Hormonic Hair Solution daily in  the morning - sent to [facility].    Viral warts, unspecified type Right medial ankle  Destruction of lesion - Right medial ankle Complexity: simple   Destruction method: cryotherapy   Informed consent: discussed and consent obtained   Timeout:  patient name, date of birth, surgical site, and procedure verified Lesion destroyed using liquid nitrogen: Yes   Region frozen until ice ball extended beyond lesion: Yes   Outcome: patient tolerated procedure well with no complications   Post-procedure details: wound care instructions given      Return in about 3 months (around 05/22/2023) for TBSE, alopecia.  I, Joanie Coddington, CMA, am acting as scribe for [employer], DO .   Documentation: I have reviewed the above documentation for accuracy and completeness, and I agree with the above.  Langston Reusing, DO

## 2023-02-21 ENCOUNTER — Other Ambulatory Visit: Payer: Self-pay

## 2023-02-21 DIAGNOSIS — R490 Dysphonia: Secondary | ICD-10-CM | POA: Diagnosis not present

## 2023-02-21 DIAGNOSIS — R29898 Other symptoms and signs involving the musculoskeletal system: Secondary | ICD-10-CM | POA: Diagnosis not present

## 2023-02-21 DIAGNOSIS — Z9889 Other specified postprocedural states: Secondary | ICD-10-CM | POA: Diagnosis not present

## 2023-02-21 DIAGNOSIS — R0989 Other specified symptoms and signs involving the circulatory and respiratory systems: Secondary | ICD-10-CM | POA: Diagnosis not present

## 2023-02-21 DIAGNOSIS — M25651 Stiffness of right hip, not elsewhere classified: Secondary | ICD-10-CM | POA: Diagnosis not present

## 2023-02-21 DIAGNOSIS — J385 Laryngeal spasm: Secondary | ICD-10-CM | POA: Diagnosis not present

## 2023-02-21 DIAGNOSIS — M25551 Pain in right hip: Secondary | ICD-10-CM | POA: Diagnosis not present

## 2023-02-21 DIAGNOSIS — R531 Weakness: Secondary | ICD-10-CM | POA: Diagnosis not present

## 2023-02-21 DIAGNOSIS — R269 Unspecified abnormalities of gait and mobility: Secondary | ICD-10-CM | POA: Diagnosis not present

## 2023-02-21 DIAGNOSIS — J383 Other diseases of vocal cords: Secondary | ICD-10-CM | POA: Diagnosis not present

## 2023-02-25 ENCOUNTER — Other Ambulatory Visit: Payer: Self-pay

## 2023-02-25 ENCOUNTER — Other Ambulatory Visit (HOSPITAL_COMMUNITY): Payer: Self-pay

## 2023-02-26 ENCOUNTER — Other Ambulatory Visit (HOSPITAL_COMMUNITY): Payer: Self-pay

## 2023-02-28 ENCOUNTER — Other Ambulatory Visit: Payer: Self-pay

## 2023-02-28 DIAGNOSIS — R490 Dysphonia: Secondary | ICD-10-CM | POA: Diagnosis not present

## 2023-02-28 DIAGNOSIS — J383 Other diseases of vocal cords: Secondary | ICD-10-CM | POA: Diagnosis not present

## 2023-02-28 DIAGNOSIS — E7800 Pure hypercholesterolemia, unspecified: Secondary | ICD-10-CM | POA: Insufficient documentation

## 2023-02-28 DIAGNOSIS — R5383 Other fatigue: Secondary | ICD-10-CM | POA: Diagnosis not present

## 2023-02-28 DIAGNOSIS — R7989 Other specified abnormal findings of blood chemistry: Secondary | ICD-10-CM | POA: Diagnosis not present

## 2023-02-28 DIAGNOSIS — M25651 Stiffness of right hip, not elsewhere classified: Secondary | ICD-10-CM | POA: Diagnosis not present

## 2023-02-28 DIAGNOSIS — R531 Weakness: Secondary | ICD-10-CM | POA: Diagnosis not present

## 2023-02-28 DIAGNOSIS — Z862 Personal history of diseases of the blood and blood-forming organs and certain disorders involving the immune mechanism: Secondary | ICD-10-CM | POA: Insufficient documentation

## 2023-02-28 DIAGNOSIS — R269 Unspecified abnormalities of gait and mobility: Secondary | ICD-10-CM | POA: Diagnosis not present

## 2023-02-28 DIAGNOSIS — M791 Myalgia, unspecified site: Secondary | ICD-10-CM | POA: Diagnosis not present

## 2023-02-28 DIAGNOSIS — J385 Laryngeal spasm: Secondary | ICD-10-CM | POA: Diagnosis not present

## 2023-02-28 DIAGNOSIS — M2550 Pain in unspecified joint: Secondary | ICD-10-CM | POA: Diagnosis not present

## 2023-02-28 DIAGNOSIS — Z789 Other specified health status: Secondary | ICD-10-CM | POA: Diagnosis not present

## 2023-02-28 DIAGNOSIS — M25551 Pain in right hip: Secondary | ICD-10-CM | POA: Diagnosis not present

## 2023-02-28 DIAGNOSIS — R0989 Other specified symptoms and signs involving the circulatory and respiratory systems: Secondary | ICD-10-CM | POA: Diagnosis not present

## 2023-02-28 DIAGNOSIS — Z9889 Other specified postprocedural states: Secondary | ICD-10-CM | POA: Diagnosis not present

## 2023-02-28 DIAGNOSIS — R790 Abnormal level of blood mineral: Secondary | ICD-10-CM | POA: Diagnosis not present

## 2023-02-28 DIAGNOSIS — R29898 Other symptoms and signs involving the musculoskeletal system: Secondary | ICD-10-CM | POA: Diagnosis not present

## 2023-03-01 ENCOUNTER — Other Ambulatory Visit: Payer: Self-pay

## 2023-03-01 ENCOUNTER — Other Ambulatory Visit (HOSPITAL_COMMUNITY): Payer: Self-pay

## 2023-03-01 DIAGNOSIS — F411 Generalized anxiety disorder: Secondary | ICD-10-CM | POA: Diagnosis not present

## 2023-03-01 DIAGNOSIS — F319 Bipolar disorder, unspecified: Secondary | ICD-10-CM | POA: Diagnosis not present

## 2023-03-01 DIAGNOSIS — M272 Inflammatory conditions of jaws: Secondary | ICD-10-CM | POA: Diagnosis not present

## 2023-03-01 DIAGNOSIS — F429 Obsessive-compulsive disorder, unspecified: Secondary | ICD-10-CM | POA: Diagnosis not present

## 2023-03-04 ENCOUNTER — Other Ambulatory Visit (HOSPITAL_COMMUNITY): Payer: Self-pay

## 2023-03-05 DIAGNOSIS — Z8349 Family history of other endocrine, nutritional and metabolic diseases: Secondary | ICD-10-CM | POA: Diagnosis not present

## 2023-03-05 DIAGNOSIS — L680 Hirsutism: Secondary | ICD-10-CM | POA: Diagnosis not present

## 2023-03-05 DIAGNOSIS — R7989 Other specified abnormal findings of blood chemistry: Secondary | ICD-10-CM | POA: Diagnosis not present

## 2023-03-05 DIAGNOSIS — Z833 Family history of diabetes mellitus: Secondary | ICD-10-CM | POA: Diagnosis not present

## 2023-03-05 DIAGNOSIS — E162 Hypoglycemia, unspecified: Secondary | ICD-10-CM | POA: Diagnosis not present

## 2023-03-06 DIAGNOSIS — R0989 Other specified symptoms and signs involving the circulatory and respiratory systems: Secondary | ICD-10-CM | POA: Diagnosis not present

## 2023-03-06 DIAGNOSIS — M25651 Stiffness of right hip, not elsewhere classified: Secondary | ICD-10-CM | POA: Diagnosis not present

## 2023-03-06 DIAGNOSIS — J385 Laryngeal spasm: Secondary | ICD-10-CM | POA: Diagnosis not present

## 2023-03-06 DIAGNOSIS — R490 Dysphonia: Secondary | ICD-10-CM | POA: Diagnosis not present

## 2023-03-06 DIAGNOSIS — R7989 Other specified abnormal findings of blood chemistry: Secondary | ICD-10-CM | POA: Diagnosis not present

## 2023-03-06 DIAGNOSIS — R000 Tachycardia, unspecified: Secondary | ICD-10-CM | POA: Diagnosis not present

## 2023-03-06 DIAGNOSIS — J383 Other diseases of vocal cords: Secondary | ICD-10-CM | POA: Diagnosis not present

## 2023-03-06 DIAGNOSIS — R531 Weakness: Secondary | ICD-10-CM | POA: Diagnosis not present

## 2023-03-06 DIAGNOSIS — M25551 Pain in right hip: Secondary | ICD-10-CM | POA: Diagnosis not present

## 2023-03-06 DIAGNOSIS — Z833 Family history of diabetes mellitus: Secondary | ICD-10-CM | POA: Diagnosis not present

## 2023-03-06 DIAGNOSIS — R29898 Other symptoms and signs involving the musculoskeletal system: Secondary | ICD-10-CM | POA: Diagnosis not present

## 2023-03-06 DIAGNOSIS — Z8349 Family history of other endocrine, nutritional and metabolic diseases: Secondary | ICD-10-CM | POA: Diagnosis not present

## 2023-03-06 DIAGNOSIS — L680 Hirsutism: Secondary | ICD-10-CM | POA: Diagnosis not present

## 2023-03-06 DIAGNOSIS — E162 Hypoglycemia, unspecified: Secondary | ICD-10-CM | POA: Diagnosis not present

## 2023-03-06 DIAGNOSIS — Z9889 Other specified postprocedural states: Secondary | ICD-10-CM | POA: Diagnosis not present

## 2023-03-06 DIAGNOSIS — R269 Unspecified abnormalities of gait and mobility: Secondary | ICD-10-CM | POA: Diagnosis not present

## 2023-03-08 DIAGNOSIS — R000 Tachycardia, unspecified: Secondary | ICD-10-CM | POA: Diagnosis not present

## 2023-03-11 ENCOUNTER — Other Ambulatory Visit: Payer: Self-pay

## 2023-03-11 ENCOUNTER — Other Ambulatory Visit (HOSPITAL_COMMUNITY): Payer: Self-pay

## 2023-03-11 DIAGNOSIS — E739 Lactose intolerance, unspecified: Secondary | ICD-10-CM | POA: Diagnosis not present

## 2023-03-11 DIAGNOSIS — Z9189 Other specified personal risk factors, not elsewhere classified: Secondary | ICD-10-CM | POA: Diagnosis not present

## 2023-03-11 DIAGNOSIS — Z789 Other specified health status: Secondary | ICD-10-CM | POA: Diagnosis not present

## 2023-03-11 DIAGNOSIS — Z713 Dietary counseling and surveillance: Secondary | ICD-10-CM | POA: Diagnosis not present

## 2023-03-12 DIAGNOSIS — R000 Tachycardia, unspecified: Secondary | ICD-10-CM | POA: Diagnosis not present

## 2023-03-12 DIAGNOSIS — F902 Attention-deficit hyperactivity disorder, combined type: Secondary | ICD-10-CM | POA: Diagnosis not present

## 2023-03-12 DIAGNOSIS — F4312 Post-traumatic stress disorder, chronic: Secondary | ICD-10-CM | POA: Diagnosis not present

## 2023-03-12 DIAGNOSIS — F429 Obsessive-compulsive disorder, unspecified: Secondary | ICD-10-CM | POA: Diagnosis not present

## 2023-03-12 DIAGNOSIS — F3170 Bipolar disorder, currently in remission, most recent episode unspecified: Secondary | ICD-10-CM | POA: Diagnosis not present

## 2023-03-13 ENCOUNTER — Other Ambulatory Visit: Payer: Self-pay

## 2023-03-13 ENCOUNTER — Other Ambulatory Visit (HOSPITAL_COMMUNITY): Payer: Self-pay

## 2023-03-13 DIAGNOSIS — R790 Abnormal level of blood mineral: Secondary | ICD-10-CM | POA: Diagnosis not present

## 2023-03-13 DIAGNOSIS — E559 Vitamin D deficiency, unspecified: Secondary | ICD-10-CM | POA: Diagnosis not present

## 2023-03-13 DIAGNOSIS — D72819 Decreased white blood cell count, unspecified: Secondary | ICD-10-CM | POA: Diagnosis not present

## 2023-03-13 DIAGNOSIS — K219 Gastro-esophageal reflux disease without esophagitis: Secondary | ICD-10-CM | POA: Diagnosis not present

## 2023-03-13 MED ORDER — PANTOPRAZOLE SODIUM 40 MG PO TBEC
40.0000 mg | DELAYED_RELEASE_TABLET | Freq: Every day | ORAL | 3 refills | Status: DC
  Filled 2023-03-13: qty 30, 30d supply, fill #0
  Filled 2023-04-06: qty 30, 30d supply, fill #1
  Filled 2023-05-06: qty 30, 30d supply, fill #2
  Filled 2023-06-05: qty 30, 30d supply, fill #3

## 2023-03-15 ENCOUNTER — Ambulatory Visit (INDEPENDENT_AMBULATORY_CARE_PROVIDER_SITE_OTHER): Payer: 59 | Admitting: Psychology

## 2023-03-15 ENCOUNTER — Encounter: Payer: Self-pay | Admitting: Psychology

## 2023-03-15 DIAGNOSIS — F3341 Major depressive disorder, recurrent, in partial remission: Secondary | ICD-10-CM

## 2023-03-15 DIAGNOSIS — F319 Bipolar disorder, unspecified: Secondary | ICD-10-CM | POA: Diagnosis not present

## 2023-03-15 DIAGNOSIS — F419 Anxiety disorder, unspecified: Secondary | ICD-10-CM | POA: Diagnosis not present
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
## 2023-03-15 NOTE — Progress Notes (Signed)
                Rudra Hobbins, PhD 

## 2023-03-19 ENCOUNTER — Other Ambulatory Visit (HOSPITAL_COMMUNITY): Payer: Self-pay

## 2023-03-19 ENCOUNTER — Encounter: Payer: Self-pay | Admitting: Dermatology

## 2023-03-19 ENCOUNTER — Other Ambulatory Visit: Payer: Self-pay

## 2023-03-19 ENCOUNTER — Ambulatory Visit: Payer: 59 | Admitting: Dermatology

## 2023-03-19 VITALS — BP 102/66 | HR 102

## 2023-03-19 DIAGNOSIS — R61 Generalized hyperhidrosis: Secondary | ICD-10-CM | POA: Diagnosis not present

## 2023-03-19 DIAGNOSIS — L732 Hidradenitis suppurativa: Secondary | ICD-10-CM | POA: Diagnosis not present

## 2023-03-19 DIAGNOSIS — L723 Sebaceous cyst: Secondary | ICD-10-CM

## 2023-03-19 MED ORDER — TRIAMCINOLONE ACETONIDE 10 MG/ML IJ SUSP
10.0000 mg | Freq: Once | INTRAMUSCULAR | Status: DC
Start: 2023-03-19 — End: 2023-07-17

## 2023-03-19 MED ORDER — TRIAMCINOLONE ACETONIDE 10 MG/ML IJ SUSP
10.0000 mg | Freq: Once | INTRAMUSCULAR | Status: AC
Start: 2023-03-19 — End: 2023-03-19
  Administered 2023-03-19: 10 mg

## 2023-03-19 MED ORDER — DOXYCYCLINE HYCLATE 100 MG PO TABS
100.0000 mg | ORAL_TABLET | Freq: Two times a day (BID) | ORAL | 3 refills | Status: AC
Start: 2023-03-19 — End: 2023-06-17
  Filled 2023-03-19: qty 30, 15d supply, fill #0
  Filled 2023-03-28: qty 30, 15d supply, fill #1

## 2023-03-19 NOTE — Patient Instructions (Addendum)
Hello Amanda Gill,  Thank you for visiting [place] today. We appreciate your commitment to managing your health conditions effectively. Here is a summary of the key instructions from today's consultation:  - Medications and Usage:   - Qbrexza: Continue using every third day for sweating. Consider setting an alarm as a reminder.   - Doxycycline: For hidradenitis suppurativa flares, start early during a flare-up.     - Dosage: One tablet twice a day for 5 to 7 days with food.     - A prescription for 30 tablets with refills has been provided.   - Benzoyl Peroxide: Apply to affected areas (e.g., PanOxyl or CeraVe). Let it sit for 30 to 60 seconds before washing off with antibacterial soap like Dial.   - Clindamycin: Use topically once a day on affected areas. A prescription has been provided.   - Kenalog Injection: Administered 0.01cc of kenalog 10mg /ml into 1 cyst today for immediate relief.  - Lifestyle Adjustments:   - Monitor your diet closely, reducing high sugar, carb intake, and dairy to see if there's a correlation with flare-ups.   - Regularly wash affected areas with antibacterial soaps and follow up with benzoyl peroxide.  - Follow-Up Care:   - Please return for a follow-up in 3 to 4 months to monitor progress.   - If symptoms worsen or become unmanageable, we may consider biologic treatments.  - Educational Materials:   - Samples of CeraVe (4% formulation) were provided to help manage skin dryness.  We look forward to seeing you again and hope for continued improvement in your condition. If you have any questions or concerns before then, please do not hesitate to contact our office.  Best regards,  Dr. Langston Reusing, Dermatology      Important Information   Due to recent changes in healthcare laws, you may see results of your pathology and/or laboratory studies on MyChart before the doctors have had a chance to review them. We understand that in some cases there may be results  that are confusing or concerning to you. Please understand that not all results are received at the same time and often the doctors may need to interpret multiple results in order to provide you with the best plan of care or course of treatment. Therefore, we ask that you please give [place] 2 business days to thoroughly review all your results before contacting the office for clarification. Should we see a critical lab result, you will be contacted sooner.     If You Need Anything After Your Visit   If you have any questions or concerns for your doctor, please call our main line at [phone]. If no one answers, please leave a voicemail as directed and we will return your call as soon as possible. Messages left after 4 pm will be answered the following business day.    You may also send [place] a message via MyChart. We typically respond to MyChart messages within 1-2 business days.  For prescription refills, please ask your pharmacy to contact our office. Our fax number is [phone].  If you have an urgent issue when the clinic is closed that cannot wait until the next business day, you can page your doctor at the number below.     Please note that while we do our best to be available for urgent issues outside of office hours, we are not available 24/7.    If you have an urgent issue and are unable to reach [place], you may  choose to seek medical care at your doctor's office, retail clinic, urgent care center, or emergency room.   If you have a medical emergency, please immediately call 911 or go to the emergency department. In the event of inclement weather, please call our main line at [phone] for an update on the status of any delays or closures.  Dermatology Medication Tips: Please keep the boxes that topical medications come in in order to help keep track of the instructions about where and how to use these. Pharmacies typically print the medication instructions only on the boxes and not  directly on the medication tubes.   If your medication is too expensive, please contact our office at [phone] or send [place] a message through MyChart.    We are unable to tell what your co-pay for medications will be in advance as this is different depending on your insurance coverage. However, we may be able to find a substitute medication at lower cost or fill out paperwork to get insurance to cover a needed medication.    If a prior authorization is required to get your medication covered by your insurance company, please allow [place] 1-2 business days to complete this process.   Drug prices often vary depending on where the prescription is filled and some pharmacies may offer cheaper prices.   The website www.goodrx.com contains coupons for medications through different pharmacies. The prices here do not account for what the cost may be with help from insurance (it may be cheaper with your insurance), but the website can give you the price if you did not use any insurance.  - You can print the associated coupon and take it with your prescription to the pharmacy.  - You may also stop by our office during regular business hours and pick up a GoodRx coupon card.  - If you need your prescription sent electronically to a different pharmacy, notify our office through [facility] or by phone at [phone]

## 2023-03-19 NOTE — Progress Notes (Signed)
Follow-Up Visit   Subjective  Amanda Gill is a 27 y.o. female who presents for the following: Hyperhidrosis & Painful boils  Patient present today for follow up visit for hyperhidrosis but would like to discuss having painful boils as a new concern. Patient was last evaluated on 12/17/22 for hyperhidrosis.Patient was prescribed Qbrexza. She has tried the Drysol but caused skin irritation so she d/cd. Patient reports sxs are better when she does remember to use it. Patient denies medication changes.   Patient also has recurrent cyst that she would like to discuss. She states the cyst are located on her genital area and inner thighs. They can become painful when they are inflamed & will sometimes drain. She states the areas have been there for the majority of her life. They come and go and patient reports she is unsure of potential trigger. When areas are irritated she will wash the areas with dial soap and after 2-3 days the areas will subside.  The following portions of the chart were reviewed this encounter and updated as appropriate: medications, allergies, medical history  Review of Systems:  No other skin or systemic complaints except as noted in HPI or Assessment and Plan.  Objective  Well appearing patient in no apparent distress; mood and affect are within normal limits.   A focused examination was performed of the following areas: Axilla & Inner thighs and Genitals  Relevant exam findings are noted in the Assessment and Plan.  Right Medial Thigh Hurley Stage 1: abscess formation (single or multiple) without sinus tracts or scarring     Assessment & Plan   HIDRADENITIS SUPPURATIVA Exam: Hurley Stage 1: abscess formation (single or multiple) without sinus tracts or scarring   Not at goal  Hidradenitis Suppurativa is a chronic; persistent; non-curable, but treatable condition due to abnormal inflamed sweat glands in the body folds (axilla, inframammary, groin, medial  thighs), causing recurrent painful draining cysts and scarring. It can be associated with severe scarring acne and cysts; also abscesses and scarring of scalp. The goal is control and prevention of flares, as it is not curable. Scars are permanent and can be thickened. Treatment may include daily use of topical medication and oral antibiotics.  Oral isotretinoin may also be helpful.  For some cases, Humira or Cosentyx (biologic injections) may be prescribed to decrease the inflammatory process and prevent flares.  When indicated, inflamed cysts may also be treated surgically.  Treatment Plan: - Recommended washing body in Cerave 4% BPO to prevent flares - We will prescribe doxycycline 100 mg to take 2 times a day at the first sign of flare   Hyperhidrosis Exam:  Patient reports sxs have improved with using Jeanelle Malling  Treatment Plan:  - Continue use of Qbrexza prescription wipes with anticholinergic properties to shut off sweat glands. Continue using 2-3 times weekly with every other day or every third day application to minimize potential side effects such as dry mouth. Prescription to be filled at [facility] pharmacy.  Hidradenitis suppurativa Right Medial Thigh  Procedure Note Intralesional Injection  Location: Right Inner thigh  Informed Consent: Discussed risks (infection, pain, bleeding, bruising, thinning of the skin, loss of skin pigment, lack of resolution, and recurrence of lesion) and benefits of the procedure, as well as the alternatives. Informed consent was obtained. Preparation: The area was prepared a standard fashion.  Anesthesia: None  Procedure Details: An intralesional injection was performed with Kenalog 10 mg/cc. 0.01 cc in total were injected. NDC #: 1610-9604-54 Exp:  06/2024  Total number of injections: 1  Plan: The patient was instructed on post-op care. Recommend OTC analgesia as needed for pain.   triamcinolone acetonide (KENALOG) 10 MG/ML injection 10 mg - Right  Medial Thigh   Related Medications doxycycline (VIBRA-TABS) 100 MG tablet Take 1 tablet (100 mg total) by mouth 2 (two) times daily. TAKE WITH HEAVY MEAL AT THE FIRST SIGN OF HS FLARE FOR 5 DAYS    No follow-ups on file.    Documentation: I have reviewed the above documentation for accuracy and completeness, and I agree with the above.  Stasia Cavalier, am acting as scribe for Langston Reusing, DO.  Langston Reusing, DO

## 2023-03-20 ENCOUNTER — Other Ambulatory Visit (HOSPITAL_COMMUNITY): Payer: Self-pay

## 2023-03-20 DIAGNOSIS — M25551 Pain in right hip: Secondary | ICD-10-CM | POA: Diagnosis not present

## 2023-03-20 DIAGNOSIS — Z9889 Other specified postprocedural states: Secondary | ICD-10-CM | POA: Diagnosis not present

## 2023-03-20 DIAGNOSIS — R490 Dysphonia: Secondary | ICD-10-CM | POA: Diagnosis not present

## 2023-03-20 DIAGNOSIS — J383 Other diseases of vocal cords: Secondary | ICD-10-CM | POA: Diagnosis not present

## 2023-03-20 DIAGNOSIS — R29898 Other symptoms and signs involving the musculoskeletal system: Secondary | ICD-10-CM | POA: Diagnosis not present

## 2023-03-20 DIAGNOSIS — R269 Unspecified abnormalities of gait and mobility: Secondary | ICD-10-CM | POA: Diagnosis not present

## 2023-03-20 DIAGNOSIS — J385 Laryngeal spasm: Secondary | ICD-10-CM | POA: Diagnosis not present

## 2023-03-20 DIAGNOSIS — R0989 Other specified symptoms and signs involving the circulatory and respiratory systems: Secondary | ICD-10-CM | POA: Diagnosis not present

## 2023-03-20 DIAGNOSIS — R531 Weakness: Secondary | ICD-10-CM | POA: Diagnosis not present

## 2023-03-20 DIAGNOSIS — M25651 Stiffness of right hip, not elsewhere classified: Secondary | ICD-10-CM | POA: Diagnosis not present

## 2023-03-21 ENCOUNTER — Encounter (INDEPENDENT_AMBULATORY_CARE_PROVIDER_SITE_OTHER): Payer: Self-pay

## 2023-03-21 DIAGNOSIS — F429 Obsessive-compulsive disorder, unspecified: Secondary | ICD-10-CM | POA: Diagnosis not present

## 2023-03-21 DIAGNOSIS — F902 Attention-deficit hyperactivity disorder, combined type: Secondary | ICD-10-CM | POA: Diagnosis not present

## 2023-03-21 DIAGNOSIS — F3170 Bipolar disorder, currently in remission, most recent episode unspecified: Secondary | ICD-10-CM | POA: Diagnosis not present

## 2023-03-21 DIAGNOSIS — F4312 Post-traumatic stress disorder, chronic: Secondary | ICD-10-CM | POA: Diagnosis not present

## 2023-03-22 ENCOUNTER — Other Ambulatory Visit (HOSPITAL_COMMUNITY): Payer: Self-pay

## 2023-03-22 DIAGNOSIS — M25559 Pain in unspecified hip: Secondary | ICD-10-CM | POA: Diagnosis not present

## 2023-03-22 DIAGNOSIS — M25562 Pain in left knee: Secondary | ICD-10-CM | POA: Diagnosis not present

## 2023-03-22 DIAGNOSIS — M2391 Unspecified internal derangement of right knee: Secondary | ICD-10-CM | POA: Diagnosis not present

## 2023-03-22 DIAGNOSIS — M222X1 Patellofemoral disorders, right knee: Secondary | ICD-10-CM | POA: Diagnosis not present

## 2023-03-22 DIAGNOSIS — M222X2 Patellofemoral disorders, left knee: Secondary | ICD-10-CM | POA: Diagnosis not present

## 2023-03-22 DIAGNOSIS — M2480 Other specific joint derangements of unspecified joint, not elsewhere classified: Secondary | ICD-10-CM | POA: Diagnosis not present

## 2023-03-22 DIAGNOSIS — R000 Tachycardia, unspecified: Secondary | ICD-10-CM | POA: Diagnosis not present

## 2023-03-22 DIAGNOSIS — M25561 Pain in right knee: Secondary | ICD-10-CM | POA: Diagnosis not present

## 2023-03-22 MED ORDER — BUPROPION HCL ER (XL) 300 MG PO TB24
300.0000 mg | ORAL_TABLET | Freq: Every morning | ORAL | 0 refills | Status: DC
  Filled 2023-03-22: qty 90, 90d supply, fill #0

## 2023-03-25 ENCOUNTER — Ambulatory Visit (INDEPENDENT_AMBULATORY_CARE_PROVIDER_SITE_OTHER): Payer: Self-pay | Admitting: Psychology

## 2023-03-25 ENCOUNTER — Encounter: Payer: Self-pay | Admitting: Psychology

## 2023-03-25 DIAGNOSIS — F429 Obsessive-compulsive disorder, unspecified: Secondary | ICD-10-CM | POA: Diagnosis not present

## 2023-03-25 DIAGNOSIS — F319 Bipolar disorder, unspecified: Secondary | ICD-10-CM | POA: Diagnosis not present

## 2023-03-25 DIAGNOSIS — M2391 Unspecified internal derangement of right knee: Secondary | ICD-10-CM | POA: Diagnosis not present

## 2023-03-25 DIAGNOSIS — F411 Generalized anxiety disorder: Secondary | ICD-10-CM | POA: Diagnosis not present

## 2023-03-25 DIAGNOSIS — F902 Attention-deficit hyperactivity disorder, combined type: Secondary | ICD-10-CM | POA: Diagnosis not present

## 2023-03-25 DIAGNOSIS — F4310 Post-traumatic stress disorder, unspecified: Secondary | ICD-10-CM | POA: Diagnosis not present

## 2023-03-25 NOTE — Progress Notes (Signed)
                Rudra Hobbins, PhD 

## 2023-03-25 NOTE — Progress Notes (Signed)
[facility] Counselor/Therapist Progress Note  Patient ID: Amanda Gill, MRN: [id number],    Date: 03/25/2023  Time Spent: 12:30 - 3:30pm   Treatment Type: Testing  Met with patient for testing session.  Patient was at the clinic and session was conducted from therapist's office in person.    Reported Symptoms/Reason for Referral: Patient presents with a history of  attention and socialization deficits anxiety, depressed mood, and compulsive behavior.  She has been previously diagnosed with ADHD but is seeking evaluation for Autism Spectrum Disorder due to impairment with social communication and several restricted repetitive behaviors.  Testing recommended to evaluate for ASD as well as other conditions that may be affecting behavior.   Mental Status Exam: Appearance:  Casual and Neatly Groomed     Behavior: Appropriate  Motor: Normal  Speech/Language:  Clear and Coherent and Normal Rate  Affect: Constricted  Mood: Anxious  Thought process: normal  Thought content:   WNL  Sensory/Perceptual disturbances:   WNL  Orientation: oriented to person, place, time/date, and situation  Attention: Good  Concentration: Fair  Memory: WNL  Fund of knowledge:  Good  Insight:   Good  Judgment:  Good  Impulse Control: Good   Risk Assessment: Danger to Self:  No Self-injurious Behavior: No Danger to Others: No Duty to Warn:no Physical Aggression / Violence:No   Subjective: Testing included the K-BIT 2R (0.75 hrs. for testing and scoring) along with the CNS Vital signs (0.75 hrs.) and [employer]- IV (1.5 hr.).    Patient was cooperative and displayed good effort. Attention and concentration were adequate overall, although patient exhibited several instances of self-correction and asking questions to be repeated.  She also had left her phone in her seat after the session was finished but remembered after leaving the building.  tended to solve problems slowly,  especially when not given a time limit.  Patient displayed multiple sensory sensitivities during computerized testing including to noise (volume of the beeps) and light (brightness of the screen).  Mood was euthymic with restricted affect.  The results appear representative of current functioning.    Diagnosis:Bipolar 1 disorder (HCC)  Plan: Testing to continue next session with the ADOS 2 Module 4, BRIEF-2, CAARS-2, ans SRS-2 for patient with patient's mother completing the Vanderbilt and SRS-2 in person at the clinic. Report writing and interactive feedback to follow.     Bryson Dames, PhD

## 2023-03-26 ENCOUNTER — Ambulatory Visit (INDEPENDENT_AMBULATORY_CARE_PROVIDER_SITE_OTHER): Payer: Self-pay | Admitting: Psychology

## 2023-03-26 ENCOUNTER — Encounter: Payer: Self-pay | Admitting: Psychology

## 2023-03-26 DIAGNOSIS — F419 Anxiety disorder, unspecified: Secondary | ICD-10-CM

## 2023-03-26 DIAGNOSIS — F319 Bipolar disorder, unspecified: Secondary | ICD-10-CM

## 2023-03-26 NOTE — Progress Notes (Signed)
 [facility] Counselor/Therapist Progress Note  Patient ID: Amanda Gill, MRN: [id number],    Date: 03/26/2023  Time Spent: 12:30 - 3:30pm   Treatment Type: Testing  Met with patient for testing session.  Patient was at the clinic and session was conducted from therapist's office in person.    Reported Symptoms/Reason for Referral: Patient presents with a history of  attention and socialization deficits anxiety, depressed mood, and compulsive behavior.  She has been previously diagnosed with ADHD but is seeking evaluation for Autism Spectrum Disorder due to impairment with social communication and several restricted repetitive behaviors.  Testing recommended to evaluate for ASD as well as other conditions that may be affecting behavior.   Mental Status Exam: Appearance:  Casual and Neatly Groomed     Behavior: Appropriate  Motor: Normal  Speech/Language:  Clear and Coherent and Normal Rate  Affect: Constricted - flat  Mood: Initially Anxious then euthymic  Thought process: normal  Thought content:   WNL  Sensory/Perceptual disturbances:   WNL  Orientation: oriented to person, place, time/date, and situation  Attention: Good  Concentration: Fair  Memory: WNL  Fund of knowledge:  Good  Insight:   Good  Judgment:  Good  Impulse Control: Good   Risk Assessment: Danger to Self:  No Self-injurious Behavior: No Danger to Others: No Duty to Warn:no Physical Aggression / Violence:No   Subjective: Testing included the ADOS 2 Module 4 (1.75 hrs. for testing and scoring) along with the BRIEF-A (0.25 hrs.), CAARS-2 (0.25 hrs.) and SRS-2 (0.25 hrs.).  Mother completed the Vanderbilt (0.25 hrs.) and SRS-2 (0.25 hrs.) during the session.     Patient was cooperative and displayed good effort. Attention and concentration were adequate overall, although patient exhibited several instances of self-correction and asking questions to be repeated.  She also had left her phone in  her seat after the session was finished but remembered after leaving the building.  tended to solve problems slowly, especially when not given a time limit.  Patient displayed multiple sensory sensitivities during computerized testing including to noise (volume of the beeps) and light (brightness of the screen).  Mood was euthymic with restricted affect.  Patient was appropriate responsive during social activities but did not initiate any interaction.  The results appear representative of current functioning.    Diagnosis:Bipolar 1 disorder (HCC)  Anxiety  Plan: Testing complete. Report writing to be conducted followed by interactive feedback next session.     Bryson Dames, PhD                  Bryson Dames, PhD

## 2023-03-27 DIAGNOSIS — J00 Acute nasopharyngitis [common cold]: Secondary | ICD-10-CM | POA: Diagnosis not present

## 2023-03-28 ENCOUNTER — Encounter: Payer: Self-pay | Admitting: Psychology

## 2023-03-28 ENCOUNTER — Other Ambulatory Visit: Payer: Self-pay

## 2023-03-28 NOTE — Progress Notes (Signed)
Amanda Gill is a 27 y.o. female patient Report writing competed ( 3 hrs.).  Interactive feedback to be conducted next session. Report to be attached to the feedback progress note.  Patient/Guardian was advised Release of Information must be obtained prior to any record release in order to collaborate their care with an outside provider. Patient/Guardian was advised if they have not already done so to contact the registration department to sign all necessary forms in order for [place] to release information regarding their care.   Consent: Patient/Guardian gives verbal consent for treatment and assignment of benefits for services provided during this visit. Patient/Guardian expressed understanding and agreed to proceed.    Bryson Dames, PhD

## 2023-03-29 DIAGNOSIS — F4312 Post-traumatic stress disorder, chronic: Secondary | ICD-10-CM | POA: Diagnosis not present

## 2023-03-29 DIAGNOSIS — F3170 Bipolar disorder, currently in remission, most recent episode unspecified: Secondary | ICD-10-CM | POA: Diagnosis not present

## 2023-03-29 DIAGNOSIS — M25551 Pain in right hip: Secondary | ICD-10-CM | POA: Diagnosis not present

## 2023-03-29 DIAGNOSIS — F429 Obsessive-compulsive disorder, unspecified: Secondary | ICD-10-CM | POA: Diagnosis not present

## 2023-03-29 DIAGNOSIS — F902 Attention-deficit hyperactivity disorder, combined type: Secondary | ICD-10-CM | POA: Diagnosis not present

## 2023-03-29 DIAGNOSIS — M545 Low back pain, unspecified: Secondary | ICD-10-CM | POA: Diagnosis not present

## 2023-03-29 DIAGNOSIS — G8929 Other chronic pain: Secondary | ICD-10-CM | POA: Diagnosis not present

## 2023-03-29 DIAGNOSIS — M419 Scoliosis, unspecified: Secondary | ICD-10-CM | POA: Diagnosis not present

## 2023-04-01 ENCOUNTER — Other Ambulatory Visit: Payer: Self-pay

## 2023-04-01 ENCOUNTER — Other Ambulatory Visit (HOSPITAL_COMMUNITY): Payer: Self-pay

## 2023-04-02 ENCOUNTER — Other Ambulatory Visit (HOSPITAL_COMMUNITY): Payer: Self-pay

## 2023-04-02 DIAGNOSIS — R0989 Other specified symptoms and signs involving the circulatory and respiratory systems: Secondary | ICD-10-CM | POA: Diagnosis not present

## 2023-04-02 DIAGNOSIS — R531 Weakness: Secondary | ICD-10-CM | POA: Diagnosis not present

## 2023-04-02 DIAGNOSIS — R490 Dysphonia: Secondary | ICD-10-CM | POA: Diagnosis not present

## 2023-04-02 DIAGNOSIS — R29898 Other symptoms and signs involving the musculoskeletal system: Secondary | ICD-10-CM | POA: Diagnosis not present

## 2023-04-02 DIAGNOSIS — R269 Unspecified abnormalities of gait and mobility: Secondary | ICD-10-CM | POA: Diagnosis not present

## 2023-04-02 DIAGNOSIS — M25651 Stiffness of right hip, not elsewhere classified: Secondary | ICD-10-CM | POA: Diagnosis not present

## 2023-04-02 DIAGNOSIS — Z9889 Other specified postprocedural states: Secondary | ICD-10-CM | POA: Diagnosis not present

## 2023-04-02 DIAGNOSIS — M25551 Pain in right hip: Secondary | ICD-10-CM | POA: Diagnosis not present

## 2023-04-02 DIAGNOSIS — J383 Other diseases of vocal cords: Secondary | ICD-10-CM | POA: Diagnosis not present

## 2023-04-02 DIAGNOSIS — J385 Laryngeal spasm: Secondary | ICD-10-CM | POA: Diagnosis not present

## 2023-04-03 DIAGNOSIS — F4312 Post-traumatic stress disorder, chronic: Secondary | ICD-10-CM | POA: Diagnosis not present

## 2023-04-03 DIAGNOSIS — F902 Attention-deficit hyperactivity disorder, combined type: Secondary | ICD-10-CM | POA: Diagnosis not present

## 2023-04-03 DIAGNOSIS — F3170 Bipolar disorder, currently in remission, most recent episode unspecified: Secondary | ICD-10-CM | POA: Diagnosis not present

## 2023-04-03 DIAGNOSIS — F429 Obsessive-compulsive disorder, unspecified: Secondary | ICD-10-CM | POA: Diagnosis not present

## 2023-04-04 ENCOUNTER — Ambulatory Visit: Payer: 59 | Admitting: Psychology

## 2023-04-04 ENCOUNTER — Encounter: Payer: Self-pay | Admitting: Psychology

## 2023-04-04 DIAGNOSIS — F900 Attention-deficit hyperactivity disorder, predominantly inattentive type: Secondary | ICD-10-CM | POA: Diagnosis not present

## 2023-04-04 DIAGNOSIS — F840 Autistic disorder: Secondary | ICD-10-CM

## 2023-04-04 NOTE — Progress Notes (Signed)
                Rudra Hobbins, PhD 

## 2023-04-04 NOTE — Progress Notes (Signed)
Psychological Testing Report - Confidential  Identifying Information:              Patient's Name:   Jalisha Enneking Yasui "Mel"  Date of Birth:   1995-06-28     Age:                27 years  MRN#:                                   [id number]      Dates of Assessment:  October 28 & 29, 2024         Purpose of Evaluation:  The purpose of the evaluation is to provide diagnostic information and treatment recommendations.  Ms. Dier was the primary informant for this evaluation.   Referral Information: Ms. Lynes was a 27 year old Black female at birth, who described herself more gender neutral and prefers to go by her chosen name of Ms.  She was self-referred for neurodevelopmental testing.  Ms. Mcneely reported that she was recently evaluated for Autism Spectrum Disorder (ASD) and Attention Deficit Hyperactivity Disorder (ADHD) but wished for a second opinion.    Relevant Background Information:  Developmental history was reported to be significant for delayed for social-emotional development.  Ms. Sutley stated that she did not remember much of her early childhood.  She reported being afraid of her Midwife.  She used to touch her mouth excessively and was tripping frequently.  She had a strong need for left-right symmetry in her actions.  Ms. Shannon whispered her orders at restaurants and her father often had to order for her.  She would not put items away after using them and engaged in bedwetting almost every night until age 3-12.  She needed to watch friends closely to learn how to play with dolls or toys.  Ms. Spring stated that she engaged in pretend play with peers but did not do other activities with them, such as playing on playground equipment.  She would cry frequently and become upset easily.    Regarding current development, Ms. Shuford reported that she moves adequately.  She can correct her balance now but still trips, spills, drops objects, and loses her balance  when moves too quickly.  She is still prone to falling and has trouble catching objects.   Speech was reported to be clear, although Ms. 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  Her language was reported to be adequately developed, but she rushes her speech and will stumble over words or skip letters when speaking.  Regarding self-care, Ms. Rockwell can physically get dressed but has trouble deciding what to wear.  She follows a routine in the morning to make sure she is ready.  Remembering to eat is difficult, as well as having food she likes to eat.  She will often choose not to eat if she doesn't have the items to make the food she wants.  She had trouble learning how to shower correctly and still has some hygiene issues.  Ms. Lotspeich reported that she can perform independent activities adequately.  She pays off a credit card, but her parents pay the household bills.  She has never been fired from a job.  Ms. Minish can drive a car, but it took her a long time to feel comfortable doing so.  She indicated getting easily frustrated by other drivers.  Socially, Ms. Mazon reported having had trouble relating to others during college and continuing to do so during graduate school.  She reported becoming more aware recently of the competitiveness and gossiping associated with law school.  She has made others uncomfortable with what she says, including bringing up irrelevant topics during meetings.  She can be prone to over-sharing with people not close to her.     Medical history was reported to be significant for Asthma and Reynaud's disease.  A history of allergies was reported including Chlorhexidine, Iodine, Lactose, cow milk, and Tilactase.  Concussions, seizures, and head injuries were denied.  Surgery history is significant for Dental Surgery , Hip Surgery (Bilateral), and an Upper Gastrointestinal Endoscopy.  Current psychotropic  medications include Wellbutrin and Quilivant.     Substance use was denied for alcohol and recreational drugs.  Psychological history was reported to be significant for Bipolar Disorder, Obsessive Compulsive Disorder, anxiety, Post Traumatic Stress Disorder, Conversion Disorder, and Attention Deficit Hyperactivity Disorder.  Ms. Drouillard is currently seeing Corwin Levins with [facility] for psychotherapy.  She sees Market researcher with [facility] for psychiatry.  Previous psychiatric hospitalization was denied.  Ms. Huestis was evaluated in March 2024 for ASD, but she did not feel like the results were representative, as her mother did not understand many of the questions on the rating scales and had to complete them without assistance. She was not diagnosed with ADHD or ASD during that evaluation due to reported lack of childhood onset.  Dyscalculia was suspected during that evaluation but also not diagnosed.    Educationally, Ms. Hochman reported that she graduated college and is a current attending [employer] school, while completing a master's degree in sustainability.  Her career goal is to be an environmental advocate.  Her undergraduate degree was in Zoology.  She reported enjoying her classes but having difficulty meeting deadlines.  She has had to ask for extensions to complete assignments along with extra time to prepare for and complete exams.  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  She previously worked as a Scientist, physiological and an Equities trader.  She reported taking her current job because it offered free on campus housing but is considering leaving the position due to her employers not giving Ms. Kazlauskas enough breaks or respecting her boundaries (working her excessively).  She indicated that the other people in her position feel the same way.   Leisure activities include spending time with her cat, playing video games,  cooking, embroidery, paper cutting, and reading for pleasure when she has the time to do so. Ms. Neitzke lives alone in college campus housing with her cat.  She reported having trained relations with her father.  She trusts her brother but doesn't say much to him.  She shares a lot with her mother but has difficulty communicating with her.  Ms. Kartes stated that she can be overly literal in interpreting what her mother says.  Ms. Hoston identifies as feeling more gender neutral and using she/they pronouns.  She likes to dress femininely but described herself as Pansexual in orientation.  Ms. Lumadue is single and does not have any children.  She currently is seeing someone with whom she has a strong attachment to, although they do not attach a formal label or descriptor of their relationship.  She feels most emotionally supported by her current friend/partner.  Family mental health history was reported to be significant  for her father being suspected of Narcissism.  However, mental health issues were not openly discussed on both sides of family, so there are not any formal diagnosed conditions.  Current stressors include keeping up with schoolwork, fitting in socially, and deciding if law school is a good fit for her.    Presenting Symptomology:  Ms. Gerard reported having trouble with sleep, due to chronic pain.  She sleeps with several pillows, a weighted blanket, and a sound machine.  She can typically fall asleep within 15 minutes but typically wakes up tired and sore.  She has some days when she is highly hungry, but most days only has a little hunger, and it is easy to ignore it.  She doesn't have a mealtime routine that fits within her school schedule.  Adequate energy was reported during the day.  She often feels fatigue but is not able to nap when tired.  Ms. Guiles reported previous episodes of sadness and depression, but not for past 10 months.  Mania was also reported with the last episode in  December 2023.  She reported feeling anxious most of her waking hours.  She used to have panic attacks and is less anxious when taking stimulants.  She has general worry and frequently thinks about worst case scenarios.  She has trouble knowing when it is her time to speak, which makes her anxious, but can give a speech in front of the class when prepared.  She reported having intrusive thought and obsessing over lab results.  She engages in excessive checking and counting, although the counting has lessened.  She used to make a clicking noise, and every activity or item had to be symmetrical and even.  She currently has a ritual to leave her building and make sure keys are with her.  Ms. Noori reported having trouble paying attention during school, socially, and managing her routine.  She becomes easily distracted, with frequent losing and forgetting.  She indicated having good organization when she has the time and discipline to implement her routines, but she has trouble doing so consistently.  Frequent restlessness and fidgeting were reported, along with impulsive speech and behavior.  Ms. Roebuck indicated that she Doesn't understand most peers, relating better to younger individuals than those her age.  Nonverbal expressions must be strong or exaggerated for Ms. Pralle to notice them.  She has some current close friendships and relationships.  She reported having phrases that she will say repeatedly at random times. She used to be obsessed with animals and currently has a strong interest in animal mating and animal facts in general.  She responds poorly to change and transition, typically relying on routine and expectation to navigate her day.  She reported a high sound sensitivity, along with hypersensitivity to smells, light, touch, and food textures.                                                            Procedures Administered: Carlos American 7  9 Client: Etta Quill Legler "Mel"       DOB:   Oct 25, 1995         MRN# [id number] [facility] [address] [place], [place], [address] Intelligence Test - 2 CNS Vital Signs 7  9 Client: Lourdez Mcgahan Spragg "Mel"  DOB:  12/28/1995         MRN# [id number] [facility] [address] [place], [place], [address] Margaret Pyle - IV Test of Achievement - Math section QMVH8  4 Client: Ryka Beighley Garron "Mel"       DOB:  09-24-1995         MRN# [id number] [facility] [address] [place], [place], [address]  Behavior Rating Inventory for Executive Function - Adult Self-Report   7  9 Client: Lux Skilton Gorley "Mel"       DOB:  04-Sep-1995         MRN# [id number] [facility] [address] [place], [place], [address] Eustaquio Boyden Adult ADHD 7  9 Client: Latangela Mccomas Cordova "Mel"       DOB:  Dec 16, 1995         MRN# [id number] [facility] [address] [place], [place], [address] Rating Scale 2 (CAARS-2) - Self Report  [facility] ADHD parent Report Scale 7  9 Client: Bethaney Oshana Creasey "Mel"       DOB:  [birth year]         MRN# [id number] [facility] [address] [place], [place], [address] Autism Diagnostic Observation Schedule 2 Module 47  9 Client: Annistyn Depass Tramontana "Mel"       DOB:  12-14-1995         MRN# [id number] [facility] [address] [place], [place], [address]  Social Responsiveness Scale - Self and Other Report 7  9 Client: Nyela Cortinas Okonski "Mel"       DOB:  Sep 17, 1995         MRN# [id number] [facility] [address] [place], [place], [address]  Behavioral Observations:  Ms. Swiney was cooperative and displayed good effort. Attention and concentration were adequate overall, although Ms. Lafontant exhibited several instances of self-correction and asking questions to be repeated.  She also had left her phone in her seat after the session was finished but  remembered after leaving the building.  She solved problems slowly, especially when not given a time limit.  Ms. Wagoner displayed multiple sensory sensitivities during computerized testing including to noise (volume of the beeps) and light (brightness of the screen).  Mood was euthymic with restricted affect.  Ms. Templin was appropriately responsive during social activities but did not initiate any interaction.  The results appear representative of current functioning.  Ms. Alvi was neatly dressed and adequately groomed, wearing all black during both sessions.  Brief mental status indicated typical general orientation and alertness.  Memory appeared typical.  Knowledge, judgement, and insight were good.  Current hallucinations, delusions, and thoughts of self-harm were denied.    Test Results and Interpretation:   General Intellectual Functioning:  Carlos American Brief Intelligence Test - 2 Composite Score Summary  Composite Scores  Sum of Raw Scores Standard Score Percentile Rank 90% Confidence Interval Qualitative Description  Verbal Comprehension  VC  90        100  50  95-107  Average  Nonverbal Reasoning PR 37 99 47 92-106 Average  Composite IQ  FSIQ - 99 47 95-103 Average    K-BIT 2 Continued Domain Subtest Name  Total Raw Score Scaled Score Percentile Rank  Verbal Verbal Knowledge VK 53   11 63  Comprehension Riddles Ri 37    9 37   The K-BIT 2 was used to assess Ms. Clavel's performance across two areas of cognitive ability. When interpreting  these scores, it is important to view the results as a snapshot of current intellectual functioning. As measured by the K-BIT 2, Ms. Fahy's Composite IQ score fell within the average range when compared to same age peers (CIQ = 44).  Ms. Swarm performance was consistent across the Primary Index Scores, as Verbal Comprehension (VCI = 100) was average and equally developed with Perceptual Reasoning (PRI = 99, average).  The difference  was not statistically significant.  This indicates age typical language understanding and visual learning ability.  On individual subtests, Ms. Coalson performed within the age typical range for verbal knowledge and impaired inferential thinking (riddles), although verbal knowledge was measured to be the stronger of the two areas.  Visual pattern analysis (matrices) was average.  Overall, Ms. Heffley appears to have adequately developed reasoning skills but may have greater factual knowledge than abstract reasoning. These scores are consistent with intellectual testing scores from her previous assessment in March 2024 (WAIS-IV) measuring Verbal intellectual ability (GAI = 96), Verbal comprehension (VCI = 96), and perceptual Reason (PRI = 96).  Current processing ability was measured through the CNS vital signs.   Attention and Processing:                                                         CNS Vital Signs   Domain Scores Standard Score Percentile Above Average Low Average Low  Neurocognition Index (NCI) 92 30  X    Composite Memory 71 3    X  Verbal Memory 82 12   X   Visual Memory 71 3    X  Psychomotor Speed 99 47  X    Reaction Time* 93 32  X    Complex Attention* 107 68  X    Cognitive Flexibility 88 21   X   Processing Speed 105 63  X    Executive Function 88 21   X   Social Acuity 109 73  X    Working Memory 105 63  X    Sustained Attention 101 53  X    Simple Attention 96 40  X    Motor Speed 97 42  X     The results of the CNS Vital Signs testing indicated average overall neurocognitive processing ability, at a level relatively consistent with measured comprehension ability (average).  Regarding areas related to attention problems, simple attention, complex attention, and sustained attention were average, while cognitive flexibility and executive function were low average.  These are the domains most closely associated with attention deficits.  Motor/psychomotor speed was  average, with average processing speed and average reaction time, indicating age typical thinking speed, coordinated movement, and responsiveness on computerized measures.  Visual memory was low, with below average verbal memory, indicating greater ability to remember words than images.  Working Civil Service fast streamer (used for multi-tasking and problems solving) was average.  Accurately reading facial expression was typically developed as social acuity was average, suggesting that she can read facial expressions when she knows to do so.  The results suggest that Ms. Talaga appears to have age typical ability attending to simple and complex activities with less developed other processing abilities including shifting attention, attending systematically, and general memory.  Multitasking, thinking speed, and response speed on computerized tasks were typically developed, suggesting that her slow overall  performance may be more related to relatively low mental organization, overthinking, and indecision than slow processing.  The validity scales indicated a valid profile for all measures.  The scores on this measure for working memory and process speed were higher on this measure than they were for the WAIS-IV administration for Working Memory (WMI = 89) and processing speed (PSI = 79) suggesting greater efficiency while keyboarding versus listening memory and handwriting.    Academic Achievement: Woodcock-Johnson IV Tests of Achievement  ([employer]orms based on 4-year university) CLUSTER/Test W GE SS PR  MATHEMATICS 543 >16.0 108 69  BROAD MATHEMATICS 540   17.0 100 50  MATH CALCULATION SKILLS 538   13.0   96 40  MATH PROBLEM SOLVING 534 >18.0 107 68        Applied Problems 545 >18.0 113 81  Calculation 541 >15.0 101 53  Math Facts Fluency 535     9.3   91 27  Number Matrices 524 >16.0 100 51   The Woodcock Johnson- IV Test of Achievement was administered due to Ms. Gullick's suspected learning deficit in Math.  The results  indicated typical mathematical skills for a college graduate with her overall score falling within the average range.  Average performance was noted across all the areas measured, although relative weakness was noted with math fluency, suggesting slower speed in processing math compared to her other mathematical abilities.  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  The results do not indicate a Specific Learning Disability as her math skills are within the average range and consistent with intellectual abilities.          Executive Function: BRIEF-A Score Summary Table Scale/Index Raw score T score Percentile  Inhibit 20 77 99  Shift 16 81 >99  Emotional Control 26 74 98  Self-Monitor 15 76 99  Behavioral Regulation Index (BRI) 77 82 >99  Initiate 19 73 98  Working Memory 22 86 >99  Plan/Organize 21 68 95  Task Monitor 18 90 >99  Organization of Materials 22 75 99  Metacognition Index (MI) 102 82 >99  Global Executive Composite (GEC) 179 84 >99  Ms. Stadel completed the Self-Report Form of the Behavior Rating Inventory of Executive Function-Adult Version (BRIEF-A) on 03/26/2023. There are no missing item responses in the protocol. Ratings of Ms. Bartz's self-regulation do not appear overly negative. Items were completed in a reasonable fashion, suggesting that the respondent did not respond to items in a haphazard or extreme manner. Responses are reasonably consistent. In the context of these validity considerations, ratings of Ms. Pless's everyday executive function suggest some areas of concern. The overall index, the Global Executive Composite (GEC), was elevated (GEC T = 84, %ile = >99). Both the Behavioral Regulation (BRI) and the Metacognition (MI) Indexes were elevated (BRI T = 82, %ile = >99 and MI T = 82, %ile = >99).      Within these summary indicators, all the individual scales are valid. All individual BRIEF-A scales were elevated, suggesting that Ms.  Cammarano reports difficulty with all aspects of executive function. Concerns are noted with her ability to inhibit impulsive responses, adjust to changes in routine or task demands, modulate emotions, monitor social behavior, initiate problem solving or activity, sustain working memory, plan, and organize problem-solving approaches, attend to task-oriented output, and organize environment and materials.  Ms. Renderos scores on the Shift and Emotional Control scales are elevated compared to age-matched peers. This profile suggests significant problem-solving rigidity combined with  emotional dysregulation. Individuals with this profile tend to lose emotional control when their routines or perspectives are challenged and/or flexibility is required.  Additionally, Ms. Tonelli's elevated scores on the Inhibit scale, and the Behavioral Regulation and the Metacognition Indexes, suggest that Ms. Buntyn is perceived as having poor inhibitory control and/or suggest that more global behavioral dysregulation is having a negative effect on active cognitive problem solving.  Behavior and Social-Emotional Functioning: CAARS 2 Scales  T-score 90% CI Percentile Guideline # of Elevated Items  Inattention/ Executive Dysfunction 72 69-75 95th Very Elevated 24/30  Hyperactivity 73 69-77 95th Very Elevated 10/13  Impulsivity 69 64-74 95th Elevated 10/13  Emotional Dysregulation 68 63-73 95th Elevated 6/9  Negative Self-Concept 52 47-57 61st Not Elevated 1/7    T-score 90% CI Percentile Guideline Symptom Count ?  ADHD Inattentive Symptoms  70 66-74 94th Very Elevated 7/9  ADHD Hyperactive/ Impulsive Symptoms 71 66-76 94th Very Elevated 8/9  Total ADHD Symptoms 72 67-77 95th Very Elevated n/a   Self-report ratings of behavior and emotional functioning indicated much overall attention and behavior regulation difficulty above clinically significant levels.  On the CAARS 2 Self Report, Ms. Lux positively endorsed, as  occurring often or very often, 7 of 9 items for inattention/poor organization and 8 of 9 items for hyperactivity and poor impulse control.  Endorsement of at least 5 items in either category is considered at-risk for ADHD.  T-scores indicated high elevations in inattention/executive dysfunction and hyperactivity, with moderately impaired impulsivity and emotion dysregulation.  Age typical functioning was rated regarding self-concept.  Parent report ratings of behavior and emotional functioning also indicated much overall attention and behavior regulation difficulty above clinically significant levels.  On the Vanderbilt ADHD Parent Report Scale, Ms. Burgett's mother Katena Petitjean positively endorsed, as occurring often or very often, 8 of 9 items for inattention/poor organization and 4 of 9 items for hyperactivity and poor impulse control.  Only 2 of 21 items were highly endorsed for disruptive behavior none were highly endorsed for anxiety or depressed mood.  Three of 8 performance areas were endorsed as somewhat problematic including relations with parents, relations with siblings, and participation in organized group activities.  Endorsement of at least 5 items in either category, along with one performance item is considered at-risk for ADHD.    Regarding symptoms of ASD, information from the ADOS 2 Module 4 indicated difficulty with several aspects of social communication and reciprocal social interaction, with some restricted-repetitive behavior observed during this administration.  Her severity score indicated a moderate likelihood of ASD, as adults often mask atypical behavior during these observations.  Within the area of communication, Ms. Crossen spoke in complete sentences.  There was little variation in her tone of voice but there was no observation of echolalia or repetitive speech.  She adequately reported routine and non-routine events, while frequently offering spontaneous personal information.   Ms. Oldfield responded adequately to personal information from the examiner and frequently asked socially related questions.  Reciprocal conversation appeared typically developed for her age and cognitive level.  She demonstrated frequent use of descriptive gestures, but she did not demonstrate any emphatic or emotional gestures.  Socially, Ms. Doucet could briefly establish but not maintain eye contact when listening or speaking.  Her range of facial expression seemed very limited, as it did not change throughout the testing.  Her expression of enjoyment in interaction was limited to her own interests and individual activities.  Ms. Ke exhibited appropriate ability elaborating on personal emotions  but did only minimally identified emotions in others during pictures or other activities, unless directed to do so.  Ms. Stotz demonstrated adequate insight into the nature of social relationships, including her own role in these relationships when asked in retrospect, although she indicated having trouble understanding the effect of her speech and actions in the moment.  Her engagement in adult independent activities seemed age typical.  Ms. Lindfors did not initiate social interaction.  She was, however, consistently responsive to social advances from the examiner.  Social rapport was deemed slightly inappropriate due to the lack of initiation and difficulty with nonverbal expression.  Her lack of facial expression made her seem anxious throughout the administration.  Ms. Goga demonstrated creative object use described more an action sequence than a story plot.  This seems consistent with measured executive function deficits.  Regarding behavior, Ms. Mosher did not demonstrate any sensory seeking behavior, odd movement or self-injury were not observed.  Compulsive behavior was not observed, Ms. Ozdemir occasionally referenced her intense interest in animals and displayed sensory aversions.      Social  Responsiveness Scale - 2 - Self Report                                Awr             Cog             Com           Mot            RRB Raw score               16                29                40               23              27                             T-score                    78                88                76               76              85  Awr = Social Awareness    Com = Social Museum/gallery exhibitions officer = Social Cognition    Mot = Social Motivation  RRB = Restricted Interests and Repetitive Behavior  DSM-5 Compatible Subscales Raw score T-score  Social Communication and Interaction      108   82    Restricted Interests and Repetitive Behavior       27   85      Ms. Rupard completed the Social Responsiveness Scale - 2 (SRS-2) regarding her behavior.  On this measure, the T Score of 83 was in the Severe range. Scores in this range indicate deficiencies in reciprocal social behavior that are clinically significant and lead to severe and enduring interference with everyday social interactions.  Such scores are strongly associated with clinical diagnosis  of an autism spectrum disorder.  Social communication and restricted repetitive behavior were both rated within the Severe range.    Social Responsiveness Scale - 2 - Other Report                                 Awr             Cog             Com           Mot            RRB Raw score                 9                24                42               26              28                            T-score                    58                79                78               81              87  Awr = Social Awareness    Com = Social Museum/gallery exhibitions officer = Social Cognition    Mot = Social Motivation  RRB = Restricted Interests and Repetitive Behavior  DSM-5 Compatible Subscales Raw score T-score  Social Communication and Interaction       101   79    Restricted Interests and Repetitive Behavior        28   87      Ms. Ponds's mother Lashaunta Sicard also completed the Social Responsiveness Scale - 2 (SRS-2) regarding Ms. Keisler's behavior.  On this measure, the T Score of 81 was within the Severe range. Scores in this range indicate deficiencies in reciprocal social behavior that are clinically significant and lead to severe and enduring interference with everyday social interactions.  Such scores are strongly associated with clinical diagnosis of an autism spectrum disorder.  Social communication and restricted repetitive behavior were both rated within the Severe range.   Summary: Ms. Foxworthy was evaluated during October 2024 related to attention, processing, and social deficits along with atypical behavior.  Ms. Osier presents with a history of attention and socialization deficits anxiety, depressed mood, and compulsive behavior.  She has been previously diagnosed with ADHD but is seeking evaluation for Autism Spectrum Disorder due to impairment with social communication and several restricted repetitive behaviors.  Testing was recommended to evaluate for ASD as well as other conditions that may be affecting behavior.  Test results indicated average overall comprehension ability (K-BIT 2R), with equally developed Nonverbal Reasoning and Verbal Comprehension.  Testing for neurocognitive processing indicated average overall functioning with average functioning regarding attention for simple and sustained activities when taking medication.  Other processing measures, however, including general memory, cognitive flexibility, and executive  function were measured to be low average or low.  Additionally, while processing speed and working memory were measured to be average on computerized measures, recent testing (March 2025) indicated low average auditory working memory and low processing speed when handwriting on the WAIS-IV.  Testing for math skills indicated average functioning overall, although math fluency (speed) was lower than her other math  skills.  The results were not suggestive of a specific learning disability but are consistent with other measures indicating slow work efficiency.  Ratings for execution function indicated clinically significant impairment in behavior, emotion, and thought regulation with all areas moderately or severely elevated.  Ratings for behavior functioning indicated severe attention and organization deficits, with clinically significant impairment regarding impulse control, hyperactivity, and emotional regulation.  Parent ratings also indicated significant attention and organization deficits with below clinical levels of hyperactivity and impulse control.  Testing for ASD indicated much difficulty with reciprocal social interaction, with some restricted repetitive behavior observed during testing.  Self-report ratings indicated severe difficulty with social communication and restricted-repetitive behavior.  Ratings from Ms. Abaya's mother indicated also indicated severe elevations for social communication and RRB's.  The results meet the criteria for ASD based on direct testing, and developmental history.  Criterion for ADHD was established as Ms. Neises's mother was able to confirm childhood onset.  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 The previous testing also indicated social anxiety, although that may be better explained through the social deficits associated with Autism spectrum disorder.  Recommendations include discussing results with psychiatrist, continuing individual counseling, and accessing appropriate educational, organizational, and social supports.  See below for further recommendations.       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  Continue medication to address attention deficits, anxiety, and mood regulation.  Medication for specifically for attention deficits (e.g. stimulant medication) seems to be helping with general attention and processing speed when handwriting and executive function are not involved.    Individual counseling is recommended to continue to help Ms. Piotrowski learn approaches to regulate her emotion and behavior, while understanding more about compensations strategies for social interaction and executive function deficits.  Ms. Valadez would benefit from a structured approach that focuses on the teaching of emotion regulation strategies along with Cognitive Behavior Therapy and Mindfulness and other techniques that can help improve concentration, manage organization difficulties, and increase problem solving.  Training in perspective taking may help Ms. Maduro cope more effectively and work through any identity concerns.  Mental alertness/energy can also be raised by increasing exercise, improving sleep, eating a healthy diet, and managing depression/stress.  Consult with a physician regarding any changes to physical regimen.   Educational accommodations are recommended for Ms. Adamczak's participation in graduate school and standardized testing.  These include extra time to complete all tests and assignments that require extensive organization and writing.   Local adult support and socialization groups for individuals with ASD can be found through the Autism Society of [facility] and the I Can house.  Books/Websites ASD Adults About Autism Autism Spectrum Disorders: The Complete Guide to Understanding Autism, Asperger's Syndrome, Pervasive Developmental Disorder, and Other ASDs by Lemmie Evens  Social Optician, dispensing for Children and Adolescents  with Asperger Syndrome and Social-Communication Problems by Dub Amis  The Hidden Curriculum: Practical Solutions for Understanding Unstated Rules in Social Situations by Alinda Dooms, Thomos Lemons, & Jack Quarto  A Friend and Relative's Guide to Supporting the Family with Autism: How Can I Help? By Dulce Sellar  Transition to Adulthood Neurodivergent Mind: Thriving in a World that [employer] for [place] by [employer]  Unmasking Autism: Discovering the Health Net of Neurodiversity by Reinaldo Meeker, PhD  Websites Mindfulness Tips and practice - Online MBSR/Mindfulness (Free) (palousemindfulness.com)  Wrong Planet is the web community designed for individuals (and parents / professionals of those) with Autism, Asperger's Syndrome, ADHD, PDDs, and other neurological differences.  Provides a discussion forum, where members communicate with each other, an article section, with exclusive articles and how-to guides, a blogging feature, and more.  ADHD Related Resources Websites: Children and Adults with Attention-Deficit/Hyperactivity Disorder (CHADD): chadd.org.  Attention Deficit Disorder Association (ADDA) HotterNames.de ADD Resources: addresources.org ADD WareHouse: addwarehouse.com World Federation of ADHD: adhd-federation.org ADDConsults: FightListings.se. Compilation of ADHD resources: https://www.harrell.com/ Online MBSR/Mindfulness (Free) (palousemindfulness.com)   Fact Sheets about ADHD: HavanaWatch.co.nz https://keller.info/ https://www.nhs.uk/conditions/attention-deficit-hyperactivity-disorder-adhd/treatment/ Employment Accommodations and Compliance: ForumChats.com.au.cfm?  Videos: "Failing at Normal: An ADHD Success Story" by Olena Leatherwood Barkley's YouTube Channel: http://www.mitchell-reyes.biz/  Books: "Taking Charge of Adult ADHD Second Edition" by Dr. Janese Banks "The ADHD Effect on Marriage" by Annamarie Dawley "The Couples Guide to Thriving with ADHD" by Annamarie Dawley "Mindfulness Meditations for ADHD" by Aura Camps      Executive System Intervention Overview Executive dysfunction can significantly impact an individual's ability to function at home, at school, at work, or in the community. Several different approaches to executive function intervention have been developed by neuropsychologists, rehabilitation specialists, and others that are aimed at helping individuals cope with executive dysfunction. One type of intervention involves the application of cognitive remediation techniques that typically emphasize repeated practice with tasks, such as memory and attention tasks, that are intended to improve the deficient skill.   Another type of intervention involves teaching compensatory strategies. These strategies are designed to circumvent rather than directly improve deficits and also have demonstrated effectiveness in a number of patient populations. Still others emphasize the interaction of the individual within the environment and how antecedent environmental modifications or accommodations can facilitate executive functions. It should be noted that these approaches to dealing with executive dysfunction need not be mutually exclusive and many intervention programs are characterized by a hybrid approach.   Compensatory strategies themselves can take several forms including using external aides (e.g., use of a notebook), learning cognitive strategies (e.g., verbalization), and making environmental modifications (e.g., keeping workspace clutter-free). Research has demonstrated that both healthy adults as well as individuals who have executive deficits commonly rely on  external aids for executive and other cognitive processes. The probability of success with compensatory strategies can be enhanced by building on an individual's existing strategies, systematically training the new strategies, and tailoring the compensatory strategies to the individual's unique needs and environmental contexts. More frequent use of aides or strategies and the use of a greater variety of aides is helpful when it comes to memory, and this also may hold true for executive dysfunction.   Adherence to routines and resistance to change may reflect Ms. Perkinson's need for predictability in her environment. An essential tenet of intervention is to facilitate feelings of security by maintaining a set of basic routines, then adjusting routines slightly in a  stepwise fashion. Larger steps may provoke resistance and distress.  An individual who has difficulties shifting set can often adjust to changes in schedule or routine with the use of visual organizers such as pictures, schedules, planners, and calendar boards. This will let Ms. Berhe know the order of activities for the day and can alert her to variations in the usual sequence of events before they occur.  Displaying a daily schedule and reviewing it at the outset of the day can help an adult like Ms. Nicklin anticipate the sequence of events and can serve as a useful reminder of any changes in her daily routine. Any changes in scheduled activities, persons, or events can be placed on Ms. Bowie's schedule and called to her attention with as much advance notice as possible. This provides more time for her to adjust to the change.    An individual who has difficulties shifting attention often needs to focus on only one task at a time. Presenting one task at a time and limiting choices to only one or two may be helpful.  Ms. Trawick might benefit from practicing her mental flexibility. Working with two or three familiar tasks and rotating them at  regular intervals may help develop greater flexibility and help her become more accustomed to shifting.  Ms. Shellhammer might benefit from external prompting to shift attention, behavior, or cognitive set from one activity or focus to the next. This may include, for example, the use of a timer on a watch or other device.  Some individuals can benefit from set time limits for each task before a shift to the next task is required.  Ms. Corrie might work on one activity or assignment for a set period then an alternative activity for the next period. Use of a timer can facilitate Ms. Balling's adjustment to change in activity.  Having regularly scheduled times during the day in which different tasks are carried out, separated by rest breaks or leisure activities, can facilitate shifting. For example, Ms. Amezcua might work on task A each morning and task B, each afternoon.  Sometimes working in small groups or being paired with another person can help individuals shift their focus or cognitive set. Others can model that it is time to change, cuing Ms. Bickham by their behavior.  Individuals such as Ms. Patry have difficulty monitoring the impact of their behavior on others. Having a supportive partner/therapist/teacher provide her with subtle cues to help recognize the effects of her behavior may be helpful. Similarly, providing opportunities for self-monitoring in contexts where she can receive constructive feedback, such as group therapy or social skills training, may also prove beneficial.  Ms. Rodriges may not be able to consider the impact of her behavior in the immediate situation. It may be helpful or necessary to discuss behavior removed from the situation.  It may be helpful to videotape Ms. Wattenbarger interacting socially and then review it together. This allows her to see herself from another's perspective.   A Social Support group may be a helpful venue to increase Ms. Fenn's awareness of the impact her  behavior has on others. This can provide not only direct skill training but also an opportunity for helpful feedback from a counselor or peers in a safe setting.  Some individuals like Ms. Wester may benefit from receiving reading material related to the nature and causes of their cognitive and behavioral problems. The provision of articles or "fact sheets" can help improve awareness, increase responsiveness to more direct interventions,  and facilitate understanding between individuals with cognitive deficits and their caregivers, family members, and supervisors.     Salvatore Decent Sicilia Killough, Ph.D. Licensed Psychologist - [facility] [place] Licensed psychologist [phone]               Bryson Dames, PhD

## 2023-04-04 NOTE — Progress Notes (Signed)
[facility] Counselor/Therapist Progress Note  Patient ID: Amanda Gill, MRN: [id number],    Date: 04/04/2023  Time Spent: 4:00 - 4:45 pm   Treatment Type: Testing - Feedback Session  Met with patient to review results of testing.  Patient was at home and session was conducted from therapist's office via video conferencing.  Mother understood the limitations of video appointments and verbally consented to telehealth.       Reported Symptoms: Patient presents with a history of  attention and socialization deficits anxiety, depressed mood, and compulsive behavior.  She has been previously diagnosed with ADHD but is seeking evaluation for Autism Spectrum Disorder due to impairment with social communication and several restricted repetitive behaviors.  Testing recommended to evaluate for ASD as well as other conditions that may be affecting behavior.   Subjective: Interactive feedback was conducted (1 hr.).  It was discussed how patient met the criterion for Autism Spectrum Disorder and ADHD along with how these conditions affect their ability to learn and relate to others.      Recommendations included discussing results with Psychiatrist, developing a visual organization system, continuing individual counseling and seeking appropriate educational and social supports.  Patient expressed agreement with the results and recommendations.     Total Time of Testing: 10 hrs. Testing and Scoring: 6 hrs. Interactive Feedback:1 hr. Report Writing: 3 hrs.   Diagnosis: Autism spectrum disorder - Level 1  Attention deficit hyperactivity disorder (ADHD), predominantly inattentive type  Plan: Report to be sent to parent and referring provider.     Bryson Dames, PhD

## 2023-04-09 DIAGNOSIS — M25861 Other specified joint disorders, right knee: Secondary | ICD-10-CM | POA: Diagnosis not present

## 2023-04-09 DIAGNOSIS — M6751 Plica syndrome, right knee: Secondary | ICD-10-CM | POA: Diagnosis not present

## 2023-04-10 DIAGNOSIS — F902 Attention-deficit hyperactivity disorder, combined type: Secondary | ICD-10-CM | POA: Diagnosis not present

## 2023-04-10 DIAGNOSIS — J383 Other diseases of vocal cords: Secondary | ICD-10-CM | POA: Diagnosis not present

## 2023-04-10 DIAGNOSIS — R490 Dysphonia: Secondary | ICD-10-CM | POA: Diagnosis not present

## 2023-04-10 DIAGNOSIS — R790 Abnormal level of blood mineral: Secondary | ICD-10-CM | POA: Diagnosis not present

## 2023-04-10 DIAGNOSIS — F4312 Post-traumatic stress disorder, chronic: Secondary | ICD-10-CM | POA: Diagnosis not present

## 2023-04-10 DIAGNOSIS — F3170 Bipolar disorder, currently in remission, most recent episode unspecified: Secondary | ICD-10-CM | POA: Diagnosis not present

## 2023-04-10 DIAGNOSIS — J385 Laryngeal spasm: Secondary | ICD-10-CM | POA: Diagnosis not present

## 2023-04-10 DIAGNOSIS — Z9889 Other specified postprocedural states: Secondary | ICD-10-CM | POA: Diagnosis not present

## 2023-04-10 DIAGNOSIS — R531 Weakness: Secondary | ICD-10-CM | POA: Diagnosis not present

## 2023-04-10 DIAGNOSIS — R269 Unspecified abnormalities of gait and mobility: Secondary | ICD-10-CM | POA: Diagnosis not present

## 2023-04-10 DIAGNOSIS — R29898 Other symptoms and signs involving the musculoskeletal system: Secondary | ICD-10-CM | POA: Diagnosis not present

## 2023-04-10 DIAGNOSIS — F429 Obsessive-compulsive disorder, unspecified: Secondary | ICD-10-CM | POA: Diagnosis not present

## 2023-04-10 DIAGNOSIS — D72819 Decreased white blood cell count, unspecified: Secondary | ICD-10-CM | POA: Diagnosis not present

## 2023-04-10 DIAGNOSIS — M25651 Stiffness of right hip, not elsewhere classified: Secondary | ICD-10-CM | POA: Diagnosis not present

## 2023-04-10 DIAGNOSIS — R0989 Other specified symptoms and signs involving the circulatory and respiratory systems: Secondary | ICD-10-CM | POA: Diagnosis not present

## 2023-04-10 DIAGNOSIS — M25551 Pain in right hip: Secondary | ICD-10-CM | POA: Diagnosis not present

## 2023-04-11 ENCOUNTER — Other Ambulatory Visit (HOSPITAL_COMMUNITY): Payer: Self-pay

## 2023-04-11 MED ORDER — ORILISSA 200 MG PO TABS
200.0000 mg | ORAL_TABLET | Freq: Two times a day (BID) | ORAL | 5 refills | Status: DC
  Filled 2023-04-11: qty 56, 28d supply, fill #0

## 2023-04-16 ENCOUNTER — Other Ambulatory Visit: Payer: Self-pay

## 2023-04-16 ENCOUNTER — Other Ambulatory Visit (HOSPITAL_COMMUNITY): Payer: Self-pay

## 2023-04-16 DIAGNOSIS — F319 Bipolar disorder, unspecified: Secondary | ICD-10-CM | POA: Diagnosis not present

## 2023-04-16 DIAGNOSIS — F4310 Post-traumatic stress disorder, unspecified: Secondary | ICD-10-CM | POA: Diagnosis not present

## 2023-04-16 DIAGNOSIS — F902 Attention-deficit hyperactivity disorder, combined type: Secondary | ICD-10-CM | POA: Diagnosis not present

## 2023-04-16 MED ORDER — METHYLPHENIDATE HCL 10 MG PO CHEW
15.0000 mg | CHEWABLE_TABLET | Freq: Every day | ORAL | 0 refills | Status: DC | PRN
  Filled 2023-04-16: qty 45, 30d supply, fill #0

## 2023-04-16 MED ORDER — CAPLYTA 42 MG PO CAPS
42.0000 mg | ORAL_CAPSULE | Freq: Every day | ORAL | 2 refills | Status: DC
  Filled 2023-04-16 – 2023-08-15 (×2): qty 30, 30d supply, fill #0
  Filled 2023-09-13: qty 30, 30d supply, fill #1

## 2023-04-16 MED ORDER — BUPROPION HCL ER (XL) 300 MG PO TB24
300.0000 mg | ORAL_TABLET | Freq: Every day | ORAL | 2 refills | Status: DC
  Filled 2023-04-16 – 2023-09-02 (×2): qty 30, 30d supply, fill #0

## 2023-04-16 MED ORDER — QUILLICHEW ER 30 MG PO CHER
CHEWABLE_EXTENDED_RELEASE_TABLET | Freq: Every day | ORAL | 0 refills | Status: DC
  Filled 2023-05-03 – 2023-05-08 (×2): qty 45, 30d supply, fill #0

## 2023-04-17 DIAGNOSIS — F4312 Post-traumatic stress disorder, chronic: Secondary | ICD-10-CM | POA: Diagnosis not present

## 2023-04-17 DIAGNOSIS — F902 Attention-deficit hyperactivity disorder, combined type: Secondary | ICD-10-CM | POA: Diagnosis not present

## 2023-04-17 DIAGNOSIS — F3170 Bipolar disorder, currently in remission, most recent episode unspecified: Secondary | ICD-10-CM | POA: Diagnosis not present

## 2023-04-17 DIAGNOSIS — F429 Obsessive-compulsive disorder, unspecified: Secondary | ICD-10-CM | POA: Diagnosis not present

## 2023-04-18 DIAGNOSIS — E119 Type 2 diabetes mellitus without complications: Secondary | ICD-10-CM | POA: Diagnosis not present

## 2023-04-18 DIAGNOSIS — N202 Calculus of kidney with calculus of ureter: Secondary | ICD-10-CM | POA: Diagnosis not present

## 2023-04-18 DIAGNOSIS — R0602 Shortness of breath: Secondary | ICD-10-CM | POA: Diagnosis not present

## 2023-04-18 DIAGNOSIS — R000 Tachycardia, unspecified: Secondary | ICD-10-CM | POA: Diagnosis not present

## 2023-04-18 DIAGNOSIS — R0789 Other chest pain: Secondary | ICD-10-CM | POA: Diagnosis not present

## 2023-04-18 DIAGNOSIS — E785 Hyperlipidemia, unspecified: Secondary | ICD-10-CM | POA: Diagnosis not present

## 2023-04-18 DIAGNOSIS — C787 Secondary malignant neoplasm of liver and intrahepatic bile duct: Secondary | ICD-10-CM | POA: Diagnosis not present

## 2023-04-18 DIAGNOSIS — N200 Calculus of kidney: Secondary | ICD-10-CM | POA: Diagnosis not present

## 2023-04-18 DIAGNOSIS — C250 Malignant neoplasm of head of pancreas: Secondary | ICD-10-CM | POA: Diagnosis not present

## 2023-04-18 DIAGNOSIS — R42 Dizziness and giddiness: Secondary | ICD-10-CM | POA: Diagnosis not present

## 2023-04-19 DIAGNOSIS — M222X2 Patellofemoral disorders, left knee: Secondary | ICD-10-CM | POA: Diagnosis not present

## 2023-04-19 DIAGNOSIS — M222X1 Patellofemoral disorders, right knee: Secondary | ICD-10-CM | POA: Diagnosis not present

## 2023-04-23 DIAGNOSIS — M25651 Stiffness of right hip, not elsewhere classified: Secondary | ICD-10-CM | POA: Diagnosis not present

## 2023-04-23 DIAGNOSIS — F4312 Post-traumatic stress disorder, chronic: Secondary | ICD-10-CM | POA: Diagnosis not present

## 2023-04-23 DIAGNOSIS — R0989 Other specified symptoms and signs involving the circulatory and respiratory systems: Secondary | ICD-10-CM | POA: Diagnosis not present

## 2023-04-23 DIAGNOSIS — J385 Laryngeal spasm: Secondary | ICD-10-CM | POA: Diagnosis not present

## 2023-04-23 DIAGNOSIS — R531 Weakness: Secondary | ICD-10-CM | POA: Diagnosis not present

## 2023-04-23 DIAGNOSIS — J383 Other diseases of vocal cords: Secondary | ICD-10-CM | POA: Diagnosis not present

## 2023-04-23 DIAGNOSIS — R29898 Other symptoms and signs involving the musculoskeletal system: Secondary | ICD-10-CM | POA: Diagnosis not present

## 2023-04-23 DIAGNOSIS — M25551 Pain in right hip: Secondary | ICD-10-CM | POA: Diagnosis not present

## 2023-04-23 DIAGNOSIS — R490 Dysphonia: Secondary | ICD-10-CM | POA: Diagnosis not present

## 2023-04-23 DIAGNOSIS — F3170 Bipolar disorder, currently in remission, most recent episode unspecified: Secondary | ICD-10-CM | POA: Diagnosis not present

## 2023-04-23 DIAGNOSIS — Z9889 Other specified postprocedural states: Secondary | ICD-10-CM | POA: Diagnosis not present

## 2023-04-23 DIAGNOSIS — R269 Unspecified abnormalities of gait and mobility: Secondary | ICD-10-CM | POA: Diagnosis not present

## 2023-04-23 DIAGNOSIS — F902 Attention-deficit hyperactivity disorder, combined type: Secondary | ICD-10-CM | POA: Diagnosis not present

## 2023-04-23 DIAGNOSIS — F429 Obsessive-compulsive disorder, unspecified: Secondary | ICD-10-CM | POA: Diagnosis not present

## 2023-04-30 DIAGNOSIS — M7918 Myalgia, other site: Secondary | ICD-10-CM | POA: Diagnosis not present

## 2023-04-30 DIAGNOSIS — G8929 Other chronic pain: Secondary | ICD-10-CM | POA: Diagnosis not present

## 2023-04-30 DIAGNOSIS — M549 Dorsalgia, unspecified: Secondary | ICD-10-CM | POA: Diagnosis not present

## 2023-04-30 DIAGNOSIS — M5416 Radiculopathy, lumbar region: Secondary | ICD-10-CM | POA: Diagnosis not present

## 2023-04-30 DIAGNOSIS — M5441 Lumbago with sciatica, right side: Secondary | ICD-10-CM | POA: Diagnosis not present

## 2023-05-02 ENCOUNTER — Other Ambulatory Visit (HOSPITAL_COMMUNITY): Payer: Self-pay

## 2023-05-02 ENCOUNTER — Other Ambulatory Visit: Payer: Self-pay

## 2023-05-02 DIAGNOSIS — G8929 Other chronic pain: Secondary | ICD-10-CM | POA: Diagnosis not present

## 2023-05-02 DIAGNOSIS — M5441 Lumbago with sciatica, right side: Secondary | ICD-10-CM | POA: Diagnosis not present

## 2023-05-02 DIAGNOSIS — M5416 Radiculopathy, lumbar region: Secondary | ICD-10-CM | POA: Diagnosis not present

## 2023-05-02 MED ORDER — CELECOXIB 100 MG PO CAPS
100.0000 mg | ORAL_CAPSULE | Freq: Two times a day (BID) | ORAL | 0 refills | Status: DC
  Filled 2023-05-02: qty 60, 30d supply, fill #0

## 2023-05-03 ENCOUNTER — Other Ambulatory Visit: Payer: Self-pay

## 2023-05-03 ENCOUNTER — Other Ambulatory Visit (HOSPITAL_COMMUNITY): Payer: Self-pay

## 2023-05-04 ENCOUNTER — Other Ambulatory Visit (HOSPITAL_COMMUNITY): Payer: Self-pay

## 2023-05-04 MED ORDER — ATENOLOL 25 MG PO TABS
12.5000 mg | ORAL_TABLET | Freq: Every day | ORAL | 5 refills | Status: DC
  Filled 2023-05-04 – 2023-05-17 (×3): qty 30, 60d supply, fill #0

## 2023-05-06 ENCOUNTER — Other Ambulatory Visit (HOSPITAL_COMMUNITY): Payer: Self-pay

## 2023-05-07 ENCOUNTER — Other Ambulatory Visit (HOSPITAL_COMMUNITY): Payer: Self-pay

## 2023-05-07 DIAGNOSIS — G8929 Other chronic pain: Secondary | ICD-10-CM | POA: Diagnosis not present

## 2023-05-07 DIAGNOSIS — M545 Low back pain, unspecified: Secondary | ICD-10-CM | POA: Diagnosis not present

## 2023-05-07 DIAGNOSIS — M222X2 Patellofemoral disorders, left knee: Secondary | ICD-10-CM | POA: Diagnosis not present

## 2023-05-07 DIAGNOSIS — M222X1 Patellofemoral disorders, right knee: Secondary | ICD-10-CM | POA: Diagnosis not present

## 2023-05-07 DIAGNOSIS — M25562 Pain in left knee: Secondary | ICD-10-CM | POA: Diagnosis not present

## 2023-05-07 DIAGNOSIS — M25561 Pain in right knee: Secondary | ICD-10-CM | POA: Diagnosis not present

## 2023-05-08 ENCOUNTER — Other Ambulatory Visit (HOSPITAL_COMMUNITY): Payer: Self-pay

## 2023-05-09 ENCOUNTER — Other Ambulatory Visit (HOSPITAL_COMMUNITY): Payer: Self-pay

## 2023-05-09 DIAGNOSIS — F4312 Post-traumatic stress disorder, chronic: Secondary | ICD-10-CM | POA: Diagnosis not present

## 2023-05-09 DIAGNOSIS — F3170 Bipolar disorder, currently in remission, most recent episode unspecified: Secondary | ICD-10-CM | POA: Diagnosis not present

## 2023-05-09 DIAGNOSIS — F429 Obsessive-compulsive disorder, unspecified: Secondary | ICD-10-CM | POA: Diagnosis not present

## 2023-05-09 DIAGNOSIS — F902 Attention-deficit hyperactivity disorder, combined type: Secondary | ICD-10-CM | POA: Diagnosis not present

## 2023-05-10 DIAGNOSIS — R0981 Nasal congestion: Secondary | ICD-10-CM | POA: Diagnosis not present

## 2023-05-10 DIAGNOSIS — J3489 Other specified disorders of nose and nasal sinuses: Secondary | ICD-10-CM | POA: Diagnosis not present

## 2023-05-10 DIAGNOSIS — R0982 Postnasal drip: Secondary | ICD-10-CM | POA: Diagnosis not present

## 2023-05-10 DIAGNOSIS — J343 Hypertrophy of nasal turbinates: Secondary | ICD-10-CM | POA: Insufficient documentation

## 2023-05-10 DIAGNOSIS — R0989 Other specified symptoms and signs involving the circulatory and respiratory systems: Secondary | ICD-10-CM | POA: Diagnosis not present

## 2023-05-10 DIAGNOSIS — J342 Deviated nasal septum: Secondary | ICD-10-CM | POA: Diagnosis not present

## 2023-05-14 DIAGNOSIS — F4312 Post-traumatic stress disorder, chronic: Secondary | ICD-10-CM | POA: Diagnosis not present

## 2023-05-14 DIAGNOSIS — F429 Obsessive-compulsive disorder, unspecified: Secondary | ICD-10-CM | POA: Diagnosis not present

## 2023-05-14 DIAGNOSIS — F3170 Bipolar disorder, currently in remission, most recent episode unspecified: Secondary | ICD-10-CM | POA: Diagnosis not present

## 2023-05-14 DIAGNOSIS — F902 Attention-deficit hyperactivity disorder, combined type: Secondary | ICD-10-CM | POA: Diagnosis not present

## 2023-05-15 ENCOUNTER — Ambulatory Visit: Payer: 59 | Admitting: Dermatology

## 2023-05-15 ENCOUNTER — Other Ambulatory Visit (HOSPITAL_COMMUNITY): Payer: Self-pay

## 2023-05-15 DIAGNOSIS — F319 Bipolar disorder, unspecified: Secondary | ICD-10-CM | POA: Diagnosis not present

## 2023-05-15 DIAGNOSIS — F902 Attention-deficit hyperactivity disorder, combined type: Secondary | ICD-10-CM | POA: Diagnosis not present

## 2023-05-15 DIAGNOSIS — F4310 Post-traumatic stress disorder, unspecified: Secondary | ICD-10-CM | POA: Diagnosis not present

## 2023-05-15 MED ORDER — LISDEXAMFETAMINE DIMESYLATE 50 MG PO CAPS
50.0000 mg | ORAL_CAPSULE | Freq: Every morning | ORAL | 0 refills | Status: DC
  Filled 2023-05-15 (×2): qty 30, 30d supply, fill #0

## 2023-05-16 ENCOUNTER — Other Ambulatory Visit (HOSPITAL_COMMUNITY): Payer: Self-pay

## 2023-05-16 ENCOUNTER — Other Ambulatory Visit: Payer: Self-pay

## 2023-05-16 MED ORDER — OXYCODONE-ACETAMINOPHEN 5-325 MG PO TABS
ORAL_TABLET | ORAL | 0 refills | Status: DC
  Filled 2023-05-16: qty 5, 2d supply, fill #0

## 2023-05-16 MED ORDER — GABAPENTIN 600 MG PO TABS
600.0000 mg | ORAL_TABLET | Freq: Three times a day (TID) | ORAL | 0 refills | Status: DC
  Filled 2023-05-16: qty 30, 10d supply, fill #0

## 2023-05-16 MED ORDER — IBUPROFEN 600 MG PO TABS
600.0000 mg | ORAL_TABLET | Freq: Three times a day (TID) | ORAL | 0 refills | Status: DC
  Filled 2023-05-16: qty 45, 15d supply, fill #0

## 2023-05-16 MED ORDER — POLYETHYLENE GLYCOL 3350 17 GM/SCOOP PO POWD
ORAL | 0 refills | Status: DC
  Filled 2023-05-16: qty 476, 28d supply, fill #0

## 2023-05-17 ENCOUNTER — Other Ambulatory Visit (HOSPITAL_COMMUNITY): Payer: Self-pay

## 2023-05-17 DIAGNOSIS — G90A Postural orthostatic tachycardia syndrome (POTS): Secondary | ICD-10-CM | POA: Diagnosis not present

## 2023-05-17 DIAGNOSIS — M9907 Segmental and somatic dysfunction of upper extremity: Secondary | ICD-10-CM | POA: Diagnosis not present

## 2023-05-17 DIAGNOSIS — M9908 Segmental and somatic dysfunction of rib cage: Secondary | ICD-10-CM | POA: Diagnosis not present

## 2023-05-17 DIAGNOSIS — M9905 Segmental and somatic dysfunction of pelvic region: Secondary | ICD-10-CM | POA: Diagnosis not present

## 2023-05-17 DIAGNOSIS — M9900 Segmental and somatic dysfunction of head region: Secondary | ICD-10-CM | POA: Diagnosis not present

## 2023-05-17 DIAGNOSIS — M9904 Segmental and somatic dysfunction of sacral region: Secondary | ICD-10-CM | POA: Diagnosis not present

## 2023-05-17 DIAGNOSIS — M9902 Segmental and somatic dysfunction of thoracic region: Secondary | ICD-10-CM | POA: Diagnosis not present

## 2023-05-17 DIAGNOSIS — M9903 Segmental and somatic dysfunction of lumbar region: Secondary | ICD-10-CM | POA: Diagnosis not present

## 2023-05-17 DIAGNOSIS — M62838 Other muscle spasm: Secondary | ICD-10-CM | POA: Diagnosis not present

## 2023-05-17 DIAGNOSIS — M9901 Segmental and somatic dysfunction of cervical region: Secondary | ICD-10-CM | POA: Diagnosis not present

## 2023-05-20 DIAGNOSIS — N941 Unspecified dyspareunia: Secondary | ICD-10-CM | POA: Diagnosis not present

## 2023-05-20 DIAGNOSIS — K5900 Constipation, unspecified: Secondary | ICD-10-CM | POA: Diagnosis not present

## 2023-05-20 DIAGNOSIS — N808 Other endometriosis: Secondary | ICD-10-CM | POA: Diagnosis not present

## 2023-05-20 DIAGNOSIS — N805 Endometriosis of intestine, unspecified: Secondary | ICD-10-CM | POA: Diagnosis not present

## 2023-05-20 DIAGNOSIS — N946 Dysmenorrhea, unspecified: Secondary | ICD-10-CM | POA: Diagnosis not present

## 2023-05-20 DIAGNOSIS — Z30433 Encounter for removal and reinsertion of intrauterine contraceptive device: Secondary | ICD-10-CM | POA: Diagnosis not present

## 2023-05-20 DIAGNOSIS — N809 Endometriosis, unspecified: Secondary | ICD-10-CM | POA: Insufficient documentation

## 2023-05-20 DIAGNOSIS — K682 Retroperitoneal fibrosis: Secondary | ICD-10-CM | POA: Diagnosis not present

## 2023-05-20 DIAGNOSIS — G894 Chronic pain syndrome: Secondary | ICD-10-CM | POA: Diagnosis not present

## 2023-05-23 ENCOUNTER — Other Ambulatory Visit: Payer: Self-pay

## 2023-05-24 ENCOUNTER — Other Ambulatory Visit (HOSPITAL_COMMUNITY): Payer: Self-pay

## 2023-06-03 ENCOUNTER — Encounter: Payer: Self-pay | Admitting: Dermatology

## 2023-06-03 ENCOUNTER — Ambulatory Visit: Payer: 59 | Admitting: Dermatology

## 2023-06-03 ENCOUNTER — Other Ambulatory Visit (HOSPITAL_COMMUNITY): Payer: Self-pay

## 2023-06-03 VITALS — BP 107/71

## 2023-06-03 DIAGNOSIS — R61 Generalized hyperhidrosis: Secondary | ICD-10-CM

## 2023-06-03 DIAGNOSIS — L732 Hidradenitis suppurativa: Secondary | ICD-10-CM | POA: Diagnosis not present

## 2023-06-03 DIAGNOSIS — L680 Hirsutism: Secondary | ICD-10-CM

## 2023-06-03 DIAGNOSIS — G4700 Insomnia, unspecified: Secondary | ICD-10-CM | POA: Diagnosis not present

## 2023-06-03 DIAGNOSIS — L74519 Primary focal hyperhidrosis, unspecified: Secondary | ICD-10-CM

## 2023-06-03 DIAGNOSIS — L309 Dermatitis, unspecified: Secondary | ICD-10-CM

## 2023-06-03 DIAGNOSIS — G90A Postural orthostatic tachycardia syndrome (POTS): Secondary | ICD-10-CM | POA: Diagnosis not present

## 2023-06-03 MED ORDER — CLOBETASOL PROPIONATE 0.05 % EX OINT
1.0000 | TOPICAL_OINTMENT | Freq: Two times a day (BID) | CUTANEOUS | 1 refills | Status: DC
  Filled 2023-06-03: qty 30, 15d supply, fill #0
  Filled 2023-06-13: qty 30, 15d supply, fill #1

## 2023-06-03 NOTE — Patient Instructions (Addendum)
 Hello Amanda Gill,  Thank you for visiting my office today. Here is a summary of the key instructions and recommendations from today's consultation:  - **Hand Dermatitis Management:**   - **Clobetasol  Ointment:** Begin using clobetasol  ointment twice daily for up to two weeks. If symptoms improve within a week, you may discontinue its use early.     - **Break Period:** Take a break after 2 weeks of use to prevent potential side effects such as skin thinning and hypopigmentation.     - **Protection:** Wear gloves during cleaning or dishwashing to protect your hands.     - **Moisturization:** Apply hand moisturizers frequently. Recommended brands are CeraVe and Neutrogena.  - **Hair Removal Options:**   - **Current Method:** Continue with manual methods like using a small electric trimmer for facial hair.   - **Future Consideration:** For permanent solutions, laser hair removal with an Nd:YAG laser is recommended for brown skin.  - **General Care:**   - **Sweating Management:** Continue using Qbrexa for sweating as it has proven to be effective and well-tolerated.   - **Monitoring:** Keep an eye on any new or worsening symptoms and report back as necessary.  Your prescription for clobetasol  ointment has been sent to your pharmacy.   Thank you once again for your visit today. Should you have any questions or require further assistance, please do not hesitate to contact my office.  Warm regards,  Dr. Delon Lenis, Dermatology   Important Information  Due to recent changes in healthcare laws, you may see results of your pathology and/or laboratory studies on MyChart before the doctors have had a chance to review them. We understand that in some cases there may be results that are confusing or concerning to you. Please understand that not all results are received at the same time and often the doctors may need to interpret multiple results in order to provide you with the best plan of care or  course of treatment. Therefore, we ask that you please give us  2 business days to thoroughly review all your results before contacting the office for clarification. Should we see a critical lab result, you will be contacted sooner.   If You Need Anything After Your Visit  If you have any questions or concerns for your doctor, please call our main line at [phone] If no one answers, please leave a voicemail as directed and we will return your call as soon as possible. Messages left after 4 pm will be answered the following business day.   You may also send us  a message via MyChart. We typically respond to MyChart messages within 1-2 business days.  For prescription refills, please ask your pharmacy to contact our office. Our fax number is [phone].  If you have an urgent issue when the clinic is closed that cannot wait until the next business day, you can page your doctor at the number below.    Please note that while we do our best to be available for urgent issues outside of office hours, we are not available 24/7.   If you have an urgent issue and are unable to reach us , you may choose to seek medical care at your doctor's office, retail clinic, urgent care center, or emergency room.  If you have a medical emergency, please immediately call 911 or go to the emergency department. In the event of inclement weather, please call our main line at [phone] for an update on the status of any delays or closures.  Dermatology Medication Tips: Please  keep the boxes that topical medications come in in order to help keep track of the instructions about where and how to use these. Pharmacies typically print the medication instructions only on the boxes and not directly on the medication tubes.   If your medication is too expensive, please contact our office at [phone] or send us  a message through MyChart.   We are unable to tell what your co-pay for medications will be in advance as  this is different depending on your insurance coverage. However, we may be able to find a substitute medication at lower cost or fill out paperwork to get insurance to cover a needed medication.   If a prior authorization is required to get your medication covered by your insurance company, please allow us  1-2 business days to complete this process.  Drug prices often vary depending on where the prescription is filled and some pharmacies may offer cheaper prices.  The website www.goodrx.com contains coupons for medications through different pharmacies. The prices here do not account for what the cost may be with help from insurance (it may be cheaper with your insurance), but the website can give you the price if you did not use any insurance.  - You can print the associated coupon and take it with your prescription to the pharmacy.  - You may also stop by our office during regular business hours and pick up a GoodRx coupon card.  - If you need your prescription sent electronically to a different pharmacy, notify our office through [facility] or by phone at [phone]

## 2023-06-03 NOTE — Progress Notes (Signed)
 Follow-Up Visit   Subjective  Amanda Gill is a 28 y.o. female who presents for the following: Her main concern today is hyperpigmentation of axillary areas. She has been advised to avoid corticosteroids as she has a flare of her mania with Bipolar 1 after the Kenalog  injection for hidradenitis at her last appointment. She is also concerned about chin hairs. She has been evaluated for PCOS and it was negative.  She is less concerned about androgenetic alopecia - she uses Hormonic Hair solution 2-3 times per week. She tries not to use it as much when she is home around her cat. She was taking Viviscal and using collagen supplement but had to stop due to a procedure for endometriosis.  The skin on her knuckles has been tearing. It has gotten worse with the weather change. She clenches her hands at night.  She has a sore spot on her right middle finger. She thinks she may have something in her finger.  The following portions of the chart were reviewed this encounter and updated as appropriate: medications, allergies, medical history  Review of Systems:  No other skin or systemic complaints except as noted in HPI or Assessment and Plan.  Objective  Well appearing patient in no apparent distress; mood and affect are within normal limits.  A focused examination was performed of the following areas: Face, hands  Relevant exam findings are noted in the Assessment and Plan.    Assessment & Plan    1. Hidradenitis Suppurativa - Assessment: Patient has a history of effective treatment with ILK injections for hidradenitis suppurativa, but experienced hypomanic symptoms as a side effect, confirmed by a psychiatrist. The condition is currently stable. - Plan: Discontinue future injections due to the risk of hypomanic side effects. Continue with current topical treatments. Maintain oral doxycycline  as needed, though it is not currently required.  2. Hyperhidrosis - Assessment: Patient's  excessive sweating is adequately controlled with the current treatment. - Plan: Continue treatment with Qbrexa.  3. Hand Dermatitis - Assessment: Patient presents with dry, tearing knuckles, exacerbated by nocturnal clenching since August of the previous year, associated with anxiety and stress. Clinical examination reveals hand dermatitis with a compromised skin barrier, worsened by weather conditions and potentially triggered by cleaning products. - Plan: Prescribe clobetasol  ointment, to be applied twice daily for up to 2 weeks, with the option to discontinue after 1 week if symptoms improve. Advise on proper use of clobetasol , including breaks to prevent skin thinning and hypopigmentation. Recommend regular use of hand moisturizers (e.g., CeraVe, Neutrogena). Advise wearing gloves when cleaning or washing dishes. Provide samples of hand moisturizers if available.  4. Hirsutism - Assessment: Patient reports ongoing unwanted hair growth, with no evidence of PCOS based on prior workup. 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 - Plan: Continue manual removal of unwanted hair on the chin. Educate on hair removal options: laser hair removal or electrolysis for permanent results. Recommend N-D-Yag laser for brown skin to avoid hyperpigmentation if the patient opts for laser treatment. Suggest handheld electric hair removers as an alternative to plucking or razor blades. Discuss spironolactone as a potential oral medication option to block hair growth-triggering hormones. HIDRADENITIS SUPPURATIVA   Related Medications doxycycline  (VIBRA -TABS) 100 MG tablet Take 1 tablet (100 mg total) by mouth 2 (two) times daily. TAKE WITH HEAVY MEAL AT THE FIRST SIGN OF HS FLARE FOR 5 DAYS PRIMARY FOCAL HYPERHIDROSIS   HAND DERMATITIS    Return for Follow up as scheduled, TBSE.  Documentation: I have reviewed the above documentation for accuracy and completeness, and I agree with the above.  Delon Lenis, DO

## 2023-06-04 ENCOUNTER — Other Ambulatory Visit (HOSPITAL_COMMUNITY): Payer: Self-pay

## 2023-06-04 ENCOUNTER — Other Ambulatory Visit (HOSPITAL_BASED_OUTPATIENT_CLINIC_OR_DEPARTMENT_OTHER): Payer: Self-pay

## 2023-06-04 DIAGNOSIS — F902 Attention-deficit hyperactivity disorder, combined type: Secondary | ICD-10-CM | POA: Diagnosis not present

## 2023-06-04 DIAGNOSIS — F840 Autistic disorder: Secondary | ICD-10-CM | POA: Diagnosis not present

## 2023-06-04 DIAGNOSIS — F4310 Post-traumatic stress disorder, unspecified: Secondary | ICD-10-CM | POA: Diagnosis not present

## 2023-06-04 DIAGNOSIS — F429 Obsessive-compulsive disorder, unspecified: Secondary | ICD-10-CM | POA: Diagnosis not present

## 2023-06-04 DIAGNOSIS — F319 Bipolar disorder, unspecified: Secondary | ICD-10-CM | POA: Diagnosis not present

## 2023-06-04 DIAGNOSIS — F411 Generalized anxiety disorder: Secondary | ICD-10-CM | POA: Diagnosis not present

## 2023-06-04 MED ORDER — CAPLYTA 42 MG PO CAPS
42.0000 mg | ORAL_CAPSULE | Freq: Every day | ORAL | 2 refills | Status: DC
  Filled 2023-06-04 – 2023-06-17 (×2): qty 30, 30d supply, fill #0
  Filled ????-??-??: fill #0

## 2023-06-04 MED ORDER — BUPROPION HCL ER (XL) 300 MG PO TB24
300.0000 mg | ORAL_TABLET | Freq: Every day | ORAL | 2 refills | Status: DC
  Filled 2023-06-04: qty 30, 30d supply, fill #0

## 2023-06-04 MED ORDER — AMPHETAMINE-DEXTROAMPHETAMINE 10 MG PO TABS
10.0000 mg | ORAL_TABLET | Freq: Every day | ORAL | 0 refills | Status: DC
  Filled 2023-06-04: qty 30, 30d supply, fill #0

## 2023-06-04 MED ORDER — LISDEXAMFETAMINE DIMESYLATE 50 MG PO CAPS
50.0000 mg | ORAL_CAPSULE | Freq: Every morning | ORAL | 0 refills | Status: DC
  Filled 2023-06-22: qty 30, 30d supply, fill #0

## 2023-06-05 ENCOUNTER — Other Ambulatory Visit (HOSPITAL_COMMUNITY): Payer: Self-pay

## 2023-06-11 ENCOUNTER — Other Ambulatory Visit (HOSPITAL_COMMUNITY): Payer: Self-pay

## 2023-06-11 DIAGNOSIS — S93402A Sprain of unspecified ligament of left ankle, initial encounter: Secondary | ICD-10-CM | POA: Diagnosis not present

## 2023-06-11 DIAGNOSIS — M25572 Pain in left ankle and joints of left foot: Secondary | ICD-10-CM | POA: Diagnosis not present

## 2023-06-11 DIAGNOSIS — R1310 Dysphagia, unspecified: Secondary | ICD-10-CM | POA: Diagnosis not present

## 2023-06-11 DIAGNOSIS — S93401A Sprain of unspecified ligament of right ankle, initial encounter: Secondary | ICD-10-CM | POA: Diagnosis not present

## 2023-06-11 DIAGNOSIS — S80811A Abrasion, right lower leg, initial encounter: Secondary | ICD-10-CM | POA: Diagnosis not present

## 2023-06-11 DIAGNOSIS — K219 Gastro-esophageal reflux disease without esophagitis: Secondary | ICD-10-CM | POA: Diagnosis not present

## 2023-06-11 DIAGNOSIS — M79604 Pain in right leg: Secondary | ICD-10-CM | POA: Diagnosis not present

## 2023-06-11 MED ORDER — OMEPRAZOLE 40 MG PO CPDR
40.0000 mg | DELAYED_RELEASE_CAPSULE | Freq: Every morning | ORAL | 3 refills | Status: DC
  Filled 2023-06-11: qty 90, 90d supply, fill #0
  Filled 2023-09-05: qty 90, 90d supply, fill #1
  Filled 2023-12-05: qty 90, 90d supply, fill #2
  Filled 2024-03-04: qty 90, 90d supply, fill #3

## 2023-06-12 DIAGNOSIS — G4710 Hypersomnia, unspecified: Secondary | ICD-10-CM | POA: Diagnosis not present

## 2023-06-13 DIAGNOSIS — J385 Laryngeal spasm: Secondary | ICD-10-CM | POA: Diagnosis not present

## 2023-06-13 DIAGNOSIS — R531 Weakness: Secondary | ICD-10-CM | POA: Diagnosis not present

## 2023-06-13 DIAGNOSIS — R269 Unspecified abnormalities of gait and mobility: Secondary | ICD-10-CM | POA: Diagnosis not present

## 2023-06-13 DIAGNOSIS — R490 Dysphonia: Secondary | ICD-10-CM | POA: Diagnosis not present

## 2023-06-13 DIAGNOSIS — R0989 Other specified symptoms and signs involving the circulatory and respiratory systems: Secondary | ICD-10-CM | POA: Diagnosis not present

## 2023-06-13 DIAGNOSIS — M25551 Pain in right hip: Secondary | ICD-10-CM | POA: Diagnosis not present

## 2023-06-13 DIAGNOSIS — Z9889 Other specified postprocedural states: Secondary | ICD-10-CM | POA: Diagnosis not present

## 2023-06-13 DIAGNOSIS — R29898 Other symptoms and signs involving the musculoskeletal system: Secondary | ICD-10-CM | POA: Diagnosis not present

## 2023-06-13 DIAGNOSIS — J383 Other diseases of vocal cords: Secondary | ICD-10-CM | POA: Diagnosis not present

## 2023-06-13 DIAGNOSIS — M25651 Stiffness of right hip, not elsewhere classified: Secondary | ICD-10-CM | POA: Diagnosis not present

## 2023-06-14 DIAGNOSIS — Z789 Other specified health status: Secondary | ICD-10-CM | POA: Diagnosis not present

## 2023-06-14 DIAGNOSIS — N809 Endometriosis, unspecified: Secondary | ICD-10-CM | POA: Diagnosis not present

## 2023-06-14 DIAGNOSIS — D72819 Decreased white blood cell count, unspecified: Secondary | ICD-10-CM | POA: Diagnosis not present

## 2023-06-14 DIAGNOSIS — E559 Vitamin D deficiency, unspecified: Secondary | ICD-10-CM | POA: Diagnosis not present

## 2023-06-14 DIAGNOSIS — R790 Abnormal level of blood mineral: Secondary | ICD-10-CM | POA: Diagnosis not present

## 2023-06-14 DIAGNOSIS — Z862 Personal history of diseases of the blood and blood-forming organs and certain disorders involving the immune mechanism: Secondary | ICD-10-CM | POA: Diagnosis not present

## 2023-06-17 ENCOUNTER — Other Ambulatory Visit (HOSPITAL_COMMUNITY): Payer: Self-pay

## 2023-06-18 ENCOUNTER — Other Ambulatory Visit (HOSPITAL_COMMUNITY): Payer: Self-pay

## 2023-06-19 ENCOUNTER — Ambulatory Visit (INDEPENDENT_AMBULATORY_CARE_PROVIDER_SITE_OTHER): Payer: 59 | Admitting: Dermatology

## 2023-06-19 ENCOUNTER — Other Ambulatory Visit (HOSPITAL_BASED_OUTPATIENT_CLINIC_OR_DEPARTMENT_OTHER): Payer: Self-pay

## 2023-06-19 ENCOUNTER — Other Ambulatory Visit (HOSPITAL_COMMUNITY): Payer: Self-pay

## 2023-06-19 ENCOUNTER — Ambulatory Visit
Admission: RE | Admit: 2023-06-19 | Discharge: 2023-06-19 | Disposition: A | Discharge status: Home / Self Care | Payer: 59 | Source: Ambulatory Visit | Attending: Emergency Medicine | Admitting: Emergency Medicine

## 2023-06-19 ENCOUNTER — Encounter: Payer: Self-pay | Admitting: Dermatology

## 2023-06-19 VITALS — BP 113/74 | HR 80

## 2023-06-19 VITALS — BP 121/77 | HR 85 | Temp 98.2°F | Resp 16

## 2023-06-19 DIAGNOSIS — W908XXA Exposure to other nonionizing radiation, initial encounter: Secondary | ICD-10-CM | POA: Diagnosis not present

## 2023-06-19 DIAGNOSIS — D492 Neoplasm of unspecified behavior of bone, soft tissue, and skin: Secondary | ICD-10-CM | POA: Diagnosis not present

## 2023-06-19 DIAGNOSIS — B079 Viral wart, unspecified: Secondary | ICD-10-CM

## 2023-06-19 DIAGNOSIS — B078 Other viral warts: Secondary | ICD-10-CM | POA: Diagnosis not present

## 2023-06-19 DIAGNOSIS — D229 Melanocytic nevi, unspecified: Secondary | ICD-10-CM | POA: Diagnosis not present

## 2023-06-19 DIAGNOSIS — D225 Melanocytic nevi of trunk: Secondary | ICD-10-CM | POA: Diagnosis not present

## 2023-06-19 DIAGNOSIS — D2262 Melanocytic nevi of left upper limb, including shoulder: Secondary | ICD-10-CM

## 2023-06-19 DIAGNOSIS — D485 Neoplasm of uncertain behavior of skin: Secondary | ICD-10-CM

## 2023-06-19 DIAGNOSIS — L578 Other skin changes due to chronic exposure to nonionizing radiation: Secondary | ICD-10-CM

## 2023-06-19 DIAGNOSIS — T148XXA Other injury of unspecified body region, initial encounter: Secondary | ICD-10-CM

## 2023-06-19 DIAGNOSIS — Z1283 Encounter for screening for malignant neoplasm of skin: Secondary | ICD-10-CM

## 2023-06-19 MED ORDER — IMIQUIMOD 5 % EX CREA
TOPICAL_CREAM | Freq: Every day | CUTANEOUS | 0 refills | Status: AC
  Filled 2023-06-19: qty 12, 30d supply, fill #0

## 2023-06-19 MED ORDER — ATENOLOL 25 MG PO TABS
25.0000 mg | ORAL_TABLET | Freq: Every day | ORAL | 5 refills | Status: DC
  Filled 2023-06-19 – 2023-07-04 (×5): qty 30, 30d supply, fill #0
  Filled 2023-07-29 – 2023-07-30 (×2): qty 30, 30d supply, fill #1
  Filled 2023-08-28: qty 30, 30d supply, fill #2
  Filled 2023-09-27: qty 30, 30d supply, fill #3
  Filled 2023-10-28: qty 30, 30d supply, fill #4
  Filled 2023-11-25: qty 30, 30d supply, fill #5

## 2023-06-19 NOTE — ED Triage Notes (Signed)
Pt states she went to her dermatologist today and had a skin biopsy done on right breast and now the bleeding will not stop. Pt states it has been 6 hours and still soaking though bandages.

## 2023-06-19 NOTE — Discharge Instructions (Signed)
We used silver nitrate to stop the bleeding of your wound.  When you get home, please gently remove the bandage that we placed here in the office and continue your dermatologist recommended wound care.  Thank you for visiting [facility] today.

## 2023-06-19 NOTE — Progress Notes (Signed)
Total Body Skin Exam (TBSE) Visit   Subjective  Amanda Gill is a 28 y.o. female who presents for the following: Skin Cancer Screening and Full Body Skin Exam & HS  Patient presents today for follow up visit for TBSE and HS. Patient was last evaluated on 06/02/22 . At that visit she had ILK injections. She stated she hasn't had a flare since her injections. She is still using Qbrexa and tolerating well. Patient denies medication changes. Patient reports she does not have spots, moles and lesions of concern to be evaluated. Patient reports throughout her lifetime she has had minimal sun exposure. Currently, patient reports if she has excessive sun exposure, she does apply sunscreen and/or wears protective coverings. Patient reports she does not have hx of bx. Patient denies  family history of skin cancers.   The following portions of the chart were reviewed this encounter and updated as appropriate: medications, allergies, medical history  Review of Systems:  No other skin or systemic complaints except as noted in HPI or Assessment and Plan.  Objective  Well appearing patient in no apparent distress; mood and affect are within normal limits.  A full examination was performed including scalp, head, eyes, ears, nose, lips, neck, chest, axillae, abdomen, back, buttocks, bilateral upper extremities, bilateral lower extremities, hands, feet, fingers, toes, fingernails, and toenails. All findings within normal limits unless otherwise noted below.   Relevant physical exam findings are noted in the Assessment and Plan.     Assessment & Plan     MELANOCYTIC NEVI - Tan-brown and/or pink-flesh-colored symmetric macules and papules - Benign appearing on exam today - Observation - Call clinic for new or changing moles - Recommend daily use of broad spectrum spf 30+ sunscreen to sun-exposed areas.   ACTINIC DAMAGE - Chronic condition, secondary to cumulative UV/sun exposure - diffuse scaly  erythematous macules with underlying dyspigmentation - Recommend daily broad spectrum sunscreen SPF 30+ to sun-exposed areas, reapply every 2 hours as needed.  - Staying in the shade or wearing long sleeves, sun glasses (UVA+UVB protection) and wide brim hats (4-inch brim around the entire circumference of the hat) are also recommended for sun protection.  - Call for new or changing lesions.   Flat Warts on Left Forearm Assessment: Multiple flat warts present on the left forearm. The patient reports these have been stable since 2020. Warts are caused by human papillomavirus (HPV) infection and are contagious.  Plan: Prescribe imiquimod cream for topical application at night. Recommend oral cimetidine 400mg  BID for 4-6 months to enhance immune response. Advise against cryotherapy due to the risk of hyperpigmentation. Educate the patient on the contagious nature of warts.    SKIN CANCER SCREENING PERFORMED TODAY.    NEOPLASM OF UNCERTAIN BEHAVIOR OF SKIN (2) Right Breast Skin / nail biopsy Type of biopsy: tangential   Informed consent: discussed and consent obtained   Timeout: patient name, date of birth, surgical site, and procedure verified   Procedure prep:  Patient was prepped and draped in usual sterile fashion Prep type:  Isopropyl alcohol Anesthesia: the lesion was anesthetized in a standard fashion   Anesthetic:  1% lidocaine w/ epinephrine 1-100,000 buffered w/ 8.4% NaHCO3 Instrument used: DermaBlade   Hemostasis achieved with: aluminum chloride   Outcome: patient tolerated procedure well   Post-procedure details: sterile dressing applied and wound care instructions given   Dressing type: petrolatum and bandage   Additional details:  6 MM irregular black macule  Left Palmar Middle 2nd Finger  Skin / nail biopsy Type of biopsy: tangential   Informed consent: discussed and consent obtained   Timeout: patient name, date of birth, surgical site, and procedure verified    Procedure prep:  Patient was prepped and draped in usual sterile fashion Prep type:  Isopropyl alcohol Anesthesia: the lesion was anesthetized in a standard fashion   Anesthetic:  1% lidocaine w/ epinephrine 1-100,000 buffered w/ 8.4% NaHCO3 Instrument used: DermaBlade   Hemostasis achieved with: aluminum chloride   Outcome: patient tolerated procedure well   Post-procedure details: sterile dressing applied and wound care instructions given   Dressing type: petrolatum gauze and bandage   Additional details:  2mm black macule VIRAL WARTS, UNSPECIFIED TYPE Left Forearm - Anterior No follow-ups on file.    Documentation: I have reviewed the above documentation for accuracy and completeness, and I agree with the above.  Stasia Cavalier, am acting as scribe for Langston Reusing, DO.  Langston Reusing, DO

## 2023-06-19 NOTE — Patient Instructions (Signed)
Hello Amanda Gill,  Thank you for visiting [place] today. Your proactive approach to maintaining your skin health is greatly appreciated. Here is a summary of the key instructions from today's consultation:  Healing Scab Care: Continue using bandages and either Aquaphor or Vaseline on the healing scab to promote proper healing.  Sunscreen Use: It's important to maintain consistent sunscreen use, even during the winter months, to protect your skin from damage.  Flat Warts: Prescription Cream: I have prescribed imiquimod cream for the flat warts on your left forearm.   Application: Please apply this cream at night as directed.  Oral Medication: As discussed, you may consider taking oral Tagamet or cimetidine to help your immune system fight the wart virus.  Follow-Up: We will follow up with you via MyChart with the lab results once they are available.  Please do not hesitate to contact us if you have any further questions or concerns. We are here to assist you in any way possible.  Warm regards,  Dr. Langston Reusing, Dermatology   Patient Handout: Wound Care for Skin Biopsy Site  Taking Care of Your Skin Biopsy Site  Proper care of the biopsy site is essential for promoting healing and minimizing scarring. This handout provides instructions on how to care for your biopsy site to ensure optimal recovery.  1. Cleaning the Wound:  Clean the biopsy site daily with gentle soap and water. Gently pat the area dry with a clean, soft towel. Avoid harsh scrubbing or rubbing the area, as this can irritate the skin and delay healing.  2. Applying Aquaphor and Bandage:  After cleaning the wound, apply a thin layer of Aquaphor ointment to the biopsy site. Cover the area with a sterile bandage to protect it from dirt, bacteria, and friction. Change the bandage daily or as needed if it becomes soiled or wet.  3. Continued Care for One Week:  Repeat the cleaning, Aquaphor application, and bandaging  process daily for one week following the biopsy procedure. Keeping the wound clean and moist during this initial healing period will help prevent infection and promote optimal healing.  4. Massaging Aquaphor into the Area:  ---After one week, discontinue the use of bandages but continue to apply Aquaphor to the biopsy site. ----Gently massage the Aquaphor into the area using circular motions. ---Massaging the skin helps to promote circulation and prevent the formation of scar tissue.   Additional Tips:  Avoid exposing the biopsy site to direct sunlight during the healing process, as this can cause hyperpigmentation or worsen scarring. If you experience any signs of infection, such as increased redness, swelling, warmth, or drainage from the wound, contact your healthcare provider immediately. Follow any additional instructions provided by your healthcare provider for caring for the biopsy site and managing any discomfort. Conclusion:  Taking proper care of your skin biopsy site is crucial for ensuring optimal healing and minimizing scarring. By following these instructions for cleaning, applying Aquaphor, and massaging the area, you can promote a smooth and successful recovery. If you have any questions or concerns about caring for your biopsy site, don't hesitate to contact your healthcare provider for guidance.    Important Information  Due to recent changes in healthcare laws, you may see results of your pathology and/or laboratory studies on MyChart before the doctors have had a chance to review them. We understand that in some cases there may be results that are confusing or concerning to you. Please understand that not all results are received at the same  time and often the doctors may need to interpret multiple results in order to provide you with the best plan of care or course of treatment. Therefore, we ask that you please give [place] 2 business days to thoroughly review all your results  before contacting the office for clarification. Should we see a critical lab result, you will be contacted sooner.   If You Need Anything After Your Visit  If you have any questions or concerns for your doctor, please call our main line at [phone] If no one answers, please leave a voicemail as directed and we will return your call as soon as possible. Messages left after 4 pm will be answered the following business day.   You may also send [place] a message via MyChart. We typically respond to MyChart messages within 1-2 business days.  For prescription refills, please ask your pharmacy to contact our office. Our fax number is [phone].  If you have an urgent issue when the clinic is closed that cannot wait until the next business day, you can page your doctor at the number below.    Please note that while we do our best to be available for urgent issues outside of office hours, we are not available 24/7.   If you have an urgent issue and are unable to reach [place], you may choose to seek medical care at your doctor's office, retail clinic, urgent care center, or emergency room.  If you have a medical emergency, please immediately call 911 or go to the emergency department. In the event of inclement weather, please call our main line at [phone] for an update on the status of any delays or closures.  Dermatology Medication Tips: Please keep the boxes that topical medications come in in order to help keep track of the instructions about where and how to use these. Pharmacies typically print the medication instructions only on the boxes and not directly on the medication tubes.   If your medication is too expensive, please contact our office at [phone] or send [place] a message through MyChart.   We are unable to tell what your co-pay for medications will be in advance as this is different depending on your insurance coverage. However, we may be able to find a substitute medication at  lower cost or fill out paperwork to get insurance to cover a needed medication.   If a prior authorization is required to get your medication covered by your insurance company, please allow [place] 1-2 business days to complete this process.  Drug prices often vary depending on where the prescription is filled and some pharmacies may offer cheaper prices.  The website www.goodrx.com contains coupons for medications through different pharmacies. The prices here do not account for what the cost may be with help from insurance (it may be cheaper with your insurance), but the website can give you the price if you did not use any insurance.  - You can print the associated coupon and take it with your prescription to the pharmacy.  - You may also stop by our office during regular business hours and pick up a GoodRx coupon card.  - If you need your prescription sent electronically to a different pharmacy, notify our office through [facility] or by phone at [phone]

## 2023-06-19 NOTE — ED Provider Notes (Signed)
[facility]    CSN: [id number] Arrival date & time: 06/19/23  1815    HISTORY   Chief Complaint  Patient presents with   Wound Check    Bleeding has not stopped from a 6mm skin biopsy performed at an office visit 6 hours prior. - Entered by patient   HPI Amanda Gill is a pleasant, 28 y.o. female who presents to urgent care today. Patient states she had a mole removed from her right breast earlier today, states that used a sideways blade close left big opening in her skin.  Patient states that the wound is continued to slowly bleed since her appointment.  Patient states she was unable to get in touch with her dermatologist office to find out what to do so she came to urgent care.  Patient states the area is not painful, the lidocaine used to numb the area still has not worn off.  The history is provided by the patient.   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   Symptomatic mammary hypertrophy 10/21/2020   Back pain 10/21/2020   Neck pain 10/21/2020   Raynaud's phenomenon 07/08/2020   History of hip surgery 06/06/2019   Acute laryngitis 07/23/2018   Acute maxillary sinusitis 07/23/2018   Fever 07/23/2018   Sore throat 07/23/2018   Allergic reaction to chemical substance 05/20/2018   Bipolar 1 disorder (HCC) 05/20/2018   Anxiety 12/06/2017   Cardiac arrhythmia 12/06/2017   Tachycardia 12/06/2017   Depression 04/26/2017   Allergic rhinitis 09/26/2010   Past Surgical History:  Procedure Laterality Date   DENTAL SURGERY     HIP SURGERY Bilateral    UPPER GASTROINTESTINAL ENDOSCOPY  2024   OB History   No obstetric history on file.    Home Medications    Prior to  Admission medications   Medication Sig Start Date End Date Taking? Authorizing Provider  albuterol (VENTOLIN HFA) 108 (90 Base) MCG/ACT inhaler Inhale 2 puffs into the lungs every 4-6 hours as needed for wheezing or shortness of breath 10/04/22     albuterol (VENTOLIN HFA) 108 (90 Base) MCG/ACT inhaler Inhale 1 puff into the lungs every 6 (six) hours as needed. 12/04/22     amphetamine-dextroamphetamine (ADDERALL) 10 MG tablet Take 1 tablet (10 mg total) by mouth daily in the afternoon. 06/04/23  Yes   atenolol (TENORMIN) 25 MG tablet Take 1/2 tablet (12.5 mg total) by mouth daily. 04/24/23  Yes   atenolol (TENORMIN) 25 MG tablet Take 1 tablet (25 mg total) by mouth daily. 06/19/23     buPROPion (WELLBUTRIN XL) 300 MG 24 hr tablet Take 1 tablet (300 mg total) by mouth daily. 11/21/22  Yes   buPROPion (WELLBUTRIN XL) 300 MG 24 hr tablet Take 1 tablet (300 mg total) by mouth daily. 12/24/22     buPROPion (WELLBUTRIN XL) 300 MG 24 hr tablet Take 1 tablet (300 mg total) by mouth daily. 04/16/23     buPROPion (WELLBUTRIN XL) 300 MG 24 hr tablet Take 1 tablet (300 mg total) by mouth daily. 06/04/23     celecoxib (CELEBREX) 100 MG capsule Take 1 capsule (100 mg total) by mouth 2 (two) times daily. Patient not taking: Reported on 06/19/2023 05/02/23  clobetasol ointment (TEMOVATE) 0.05 % Apply 1 Application topically 2 (two) times daily. Apply to affected areas of hands twice daily for 2 weeks. Take a 2 week break before restarting. 06/03/23   Terri Piedra, DO  Efinaconazole 10 % SOLN Apply 1 drop topically daily. 08/25/21   Vivi Barrack, DPM  Elagolix Sodium (ORILISSA) 200 MG TABS Take 1 tablet (200 mg total) by mouth 2 (two) times daily. 04/11/23     EPINEPHrine 0.3 mg/0.3 mL IJ SOAJ injection Inject into the muscle. 05/20/18   [provider]  gabapentin (NEURONTIN) 600 MG tablet Take one tablet by mouth every 8 hours for 5 days after surgery. For post op days 6-10. Patient not taking: Reported  on 06/19/2023 05/16/23     Glycopyrronium Tosylate (QBREXZA) 2.4 % PADS Apply to underarms every night 12/17/22  Yes Terri Piedra, DO  ibuprofen (ADVIL) 600 MG tablet Take 1 tablet by mouth every 8 hours for 5 days after surgery as needed for pain. 05/16/23  Yes   imiquimod (ALDARA) 5 % cream Apply topically at bedtime. 06/19/23   Terri Piedra, DO  levonorgestrel [facility]) 19.5 MG IUD provided by care center/dk Patient not taking: Reported on 06/19/2023 01/18/23   [provider]  lisdexamfetamine (VYVANSE) 50 MG capsule Take 1 capsule (50 mg total) by mouth every morning. 05/15/23  Yes   lisdexamfetamine (VYVANSE) 50 MG capsule Take 1 capsule (50 mg total) by mouth every morning. 06/11/23     lumateperone tosylate (CAPLYTA) 42 MG capsule Take 1 capsule (42 mg total) by mouth at bedtime. 05/02/22  Yes   lumateperone tosylate (CAPLYTA) 42 MG capsule Take 1 capsule (42 mg) by mouth daily. 10/10/22     lumateperone tosylate (CAPLYTA) 42 MG capsule Take 1 capsule (42 mg) by mouth daily. 11/21/22     lumateperone tosylate (CAPLYTA) 42 MG capsule Take 1 capsule (42 mg total) by mouth daily. 12/24/22     lumateperone tosylate (CAPLYTA) 42 MG capsule Take 1 capsule (42 mg total) by mouth daily. 04/16/23     lumateperone tosylate (CAPLYTA) 42 MG capsule Take 1 capsule (42 mg total) by mouth daily. 06/04/23     methylphenidate (QUILLICHEW ER) 20 MG CHER chewable tablet Take 1/2 tablet by mouth in the morning. 12/03/22     methylphenidate (QUILLICHEW ER) 20 MG CHER chewable tablet Take 1 tablet (20 mg total) by mouth in the morning. 01/21/23     methylphenidate (QUILLICHEW ER) 20 MG CHER chewable tablet Take 1 tablet (20 mg total) by mouth in the morning. Patient not taking: Reported on 06/19/2023 02/18/23     methylphenidate (QUILLICHEW ER) 20 MG CHER chewable tablet Take 1 tablet (20 mg total) by mouth in the morning. 12/24/22     Methylphenidate HCl (QUILLICHEW ER) 30 MG CHER chewable tablet Chew 1  tablet (30 mg total) by mouth in the morning. 04/14/23 Patient not taking: Reported on 06/19/2023 04/14/23     Methylphenidate HCl (QUILLICHEW ER) 30 MG CHER chewable tablet Chew 1 tablet (30 mg total) by mouth in the morning. 03/17/23 Patient not taking: Reported on 03/19/2023 03/17/23     Methylphenidate HCl (QUILLICHEW ER) 30 MG CHER chewable tablet Chew 1 tablet (30 mg total) by mouth in the morning. Patient not taking: Reported on 03/19/2023 02/18/23     Methylphenidate HCl (QUILLICHEW ER) 30 MG CHER chewable tablet Take 1 and 1/2 tablets (45 mg total) by mouth daily. Patient not taking: Reported on 06/19/2023 04/16/23  Methylphenidate HCl 10 MG CHEW Chew 1 tablet (10 mg total) by mouth 2 (two) times daily. 10/11/22     Methylphenidate HCl 10 MG CHEW Chew 1.5 tablets (15 mg total) by mouth daily in the afternoon as needed. Patient not taking: Reported on 03/19/2023 02/18/23     Methylphenidate HCl 10 MG CHEW Chew 1.5 tablets (15 mg total) by mouth daily in the afternoon as needed. 03/17/23 Patient not taking: Reported on 06/19/2023 03/17/23     Methylphenidate HCl 10 MG CHEW Chew 1.5 tablets (15 mg total) by mouth daily in the afternoon as needed. 04/14/23 Patient not taking: Reported on 06/19/2023 04/14/23     Methylphenidate HCl 10 MG CHEW Chew 1.5 tablets (15 mg total) by mouth daily in the afternoon as needed. Patient not taking: Reported on 06/19/2023 04/16/23     metroNIDAZOLE (FLAGYL) 500 MG tablet Take 500 mg by mouth 3 (three) times daily.    [provider]  mirabegron ER (MYRBETRIQ) 50 MG TB24 tablet Take 1 tablet (50 mg total) by mouth daily. Patient not taking: Reported on 06/19/2023 10/17/22   Alfredo Martinez, MD  NONFORMULARY OR COMPOUNDED ITEM [facility]: circulation 07/17/22   Vivi Barrack, DPM  omeprazole (PRILOSEC) 40 MG capsule Take 1 capsule (40 mg total) by mouth in the morning. 06/11/23  Yes   oxyCODONE-acetaminophen (PERCOCET/ROXICET) 5-325 MG  tablet Take 1 tablet by mouth every 4-6 hours as needed for post op pain Patient not taking: Reported on 06/19/2023 05/16/23     polyethylene glycol powder (MIRALAX) 17 GM/SCOOP powder Take 17g daily as directed. 05/16/23  Yes   Safety Seal Miscellaneous MISC Apply 1 application  topically in the morning. Hormonic Hair Solution 02/20/23  Yes Terri Piedra, DO  sildenafil (REVATIO) 20 MG tablet Take 1 tablet (20 mg total) by mouth daily. 08/09/22     sildenafil (REVATIO) 20 MG tablet Take 3 tablets (60 mg total) by mouth daily. Patient not taking: Reported on 06/19/2023 09/14/22     Spacer/Aero-Holding Chambers (AEROCHAMBER MAX W/FLOW-VU) MISC Use spacer as directed with inhaler 10/12/22     Spacer/Aero-Holding Chambers (VORTEX VALVED HOLDING CHAMBER) DEVI Use 1 device as directed with inhaler 10/04/22     triamcinolone cream (KENALOG) 0.1 % Apply twice a day for 10 days Patient not taking: Reported on 06/19/2023 12/17/22   Terri Piedra, DO  esomeprazole (NEXIUM) 40 MG packet Take 1 packet (40 mg total) by mouth every morning before breakfast. Please ensure that this is lactose free.  Patient has severe lactose allergy 10/01/22 02/01/23    norethindrone (MICRONOR) 0.35 MG tablet Take 1 tablet (0.35 mg total) by mouth daily. Patient not taking: Reported on 12/17/2022 12/07/22 04/11/23    pantoprazole (PROTONIX) 40 MG tablet Take 1 tablet (40 mg total) by mouth every morning before breakfast. 03/13/23 06/11/23    sucralfate (CARAFATE) 1 g tablet Take 1 tablet (1 g total) by mouth 4 (four) times a day. 10/01/22 02/01/23      Family History History reviewed. No pertinent family history. Social History Social History   Tobacco Use   Smoking status: Never    Passive exposure: Never   Smokeless tobacco: Never  Vaping Use   Vaping status: Never Used  Substance Use Topics   Alcohol use: No   Drug use: No   Allergies   Chlorhexidine, Iodine, Lactose, Cortisone, Lactose intolerance (gi), Milk (cow),  Tilactase, and Triamcinolone acetonide  Review of Systems Review of Systems Pertinent findings revealed after performing  a 14 point review of systems has been noted in the history of present illness.  Physical Exam Vital Signs BP 121/77 (BP Location: Right Arm)   Pulse 85   Temp 98.2 F (36.8 C) (Oral)   Resp 16   LMP 05/30/2023 (Exact Date)   SpO2 99%   No data found.  Physical Exam Vitals and nursing note reviewed.  Constitutional:      General: She is not in acute distress.    Appearance: Normal appearance.  HENT:     Head: Normocephalic and atraumatic.  Eyes:     Pupils: Pupils are equal, round, and reactive to light.  Cardiovascular:     Rate and Rhythm: Normal rate and regular rhythm.  Pulmonary:     Effort: Pulmonary effort is normal.     Breath sounds: Normal breath sounds.  Musculoskeletal:        General: Normal range of motion.     Cervical back: Normal range of motion and neck supple.  Skin:    General: Skin is warm and dry.       Neurological:     General: No focal deficit present.     Mental Status: She is alert and oriented to person, place, and time. Mental status is at baseline.  Psychiatric:        Mood and Affect: Mood normal.        Behavior: Behavior normal.        Thought Content: Thought content normal.        Judgment: Judgment normal.     Visual Acuity Right Eye Distance:   Left Eye Distance:   Bilateral Distance:    Right Eye Near:   Left Eye Near:    Bilateral Near:     UC Couse / Diagnostics / Procedures:     Radiology No results found.  Procedures Wound Care  Date/Time: 06/19/2023 6:48 PM  Performed by: Theadora Rama Scales, PA-C Authorized by: Theadora Rama Scales, PA-C   Consent:    Consent obtained:  Verbal   Consent given by:  Patient   Risks, benefits, and alternatives were discussed: yes     Risks discussed:  Bleeding, infection and pain   Alternatives discussed:  No treatment, delayed treatment,  observation, alternative treatment and referral Universal protocol:    Procedure explained and questions answered to patient or proxy's satisfaction: yes     Patient identity confirmed:  Verbally with patient Sedation:    Sedation type:  None Anesthesia:    Anesthesia method:  None Procedure details:    Indications: open wounds     Wound surface area (sq cm):  0.3 Dressing:    Dressing applied:  Telfa pad Comments:     Hemostasis achieved with application of silver nitrate x 2  (including critical care time) EKG  Pending results:  Labs Reviewed - No data to display  Medications Ordered in UC: Medications - No data to display  UC Diagnoses / Final Clinical Impressions(s)   I have reviewed the triage vital signs and the nursing notes.  Pertinent labs & imaging results that were available during my care of the patient were reviewed by me and considered in my medical decision making (see chart for details).    Final diagnoses:  Bleeding from wound  Hemostasis was achieved with application of silver nitrate x 2.  Patient advised to continue wound care as recommended by dermatologist.  Patient further advised that if bleeding resumes, she should reach out to her dermatologist  tomorrow morning.  Please see discharge instructions below for details of plan of care as provided to patient. ED Prescriptions   None    PDMP not reviewed this encounter.  Pending results:  Labs Reviewed - No data to display    Discharge Instructions      We used silver nitrate to stop the bleeding of your wound.  When you get home, please gently remove the bandage that we placed here in the office and continue your dermatologist recommended wound care.  Thank you for visiting [facility] today.    Disposition Upon Discharge:  Condition: stable for discharge home  Patient presented with an acute illness with associated systemic symptoms and significant discomfort requiring urgent  management. In my opinion, this is a condition that a prudent lay person (someone who possesses an average knowledge of health and medicine) may potentially expect to result in complications if not addressed urgently such as respiratory distress, impairment of bodily function or dysfunction of bodily organs.   Routine symptom specific, illness specific and/or disease specific instructions were discussed with the patient and/or caregiver at length.   As such, the patient has been evaluated and assessed, work-up was performed and treatment was provided in alignment with urgent care protocols and evidence based medicine.  Patient/parent/caregiver has been advised that the patient may require follow up for further testing and treatment if the symptoms continue in spite of treatment, as clinically indicated and appropriate.  Patient/parent/caregiver has been advised to return to the [facility] or PCP if no better; to PCP or the Emergency Department if new signs and symptoms develop, or if the current signs or symptoms continue to change or worsen for further workup, evaluation and treatment as clinically indicated and appropriate  The patient will follow up with their current PCP if and as advised. If the patient does not currently have a PCP we will assist them in obtaining one.   The patient may need specialty follow up if the symptoms continue, in spite of conservative treatment and management, for further workup, evaluation, consultation and treatment as clinically indicated and appropriate.  Patient/parent/caregiver verbalized understanding and agreement of plan as discussed.  All questions were addressed during visit.  Please see discharge instructions below for further details of plan.  This office note has been dictated using Teaching laboratory technician.  Unfortunately, this method of dictation can sometimes lead to typographical or grammatical errors.  I apologize for your inconvenience in advance  if this occurs.  Please do not hesitate to reach out to me if clarification is needed.      Theadora Rama Scales, [place] 06/19/23 [phone]

## 2023-06-20 ENCOUNTER — Other Ambulatory Visit (HOSPITAL_COMMUNITY): Payer: Self-pay

## 2023-06-20 ENCOUNTER — Encounter: Payer: Self-pay | Admitting: Dermatology

## 2023-06-20 LAB — SURGICAL PATHOLOGY

## 2023-06-20 NOTE — Progress Notes (Signed)
Hi Dimitri,  Dr. Onalee Hua reviewed your biopsy results and they showed the spots removed were a little "abnormal" but not cancerous.  No additional treatment is required.  We will continue to monitor the areas for re-pigmentation during your annual skin exams. The detailed report is available to view in MyChart.  Have a great day!  Kind Regards,  Dr. Kermit Balo Care Team

## 2023-06-21 ENCOUNTER — Other Ambulatory Visit (HOSPITAL_COMMUNITY): Payer: Self-pay

## 2023-06-21 DIAGNOSIS — M2550 Pain in unspecified joint: Secondary | ICD-10-CM | POA: Diagnosis not present

## 2023-06-21 DIAGNOSIS — I7300 Raynaud's syndrome without gangrene: Secondary | ICD-10-CM | POA: Diagnosis not present

## 2023-06-21 DIAGNOSIS — M9903 Segmental and somatic dysfunction of lumbar region: Secondary | ICD-10-CM | POA: Diagnosis not present

## 2023-06-21 DIAGNOSIS — M9908 Segmental and somatic dysfunction of rib cage: Secondary | ICD-10-CM | POA: Diagnosis not present

## 2023-06-21 DIAGNOSIS — F319 Bipolar disorder, unspecified: Secondary | ICD-10-CM | POA: Diagnosis not present

## 2023-06-21 DIAGNOSIS — M9907 Segmental and somatic dysfunction of upper extremity: Secondary | ICD-10-CM | POA: Diagnosis not present

## 2023-06-21 DIAGNOSIS — M9900 Segmental and somatic dysfunction of head region: Secondary | ICD-10-CM | POA: Diagnosis not present

## 2023-06-21 DIAGNOSIS — M9906 Segmental and somatic dysfunction of lower extremity: Secondary | ICD-10-CM | POA: Diagnosis not present

## 2023-06-21 DIAGNOSIS — M9904 Segmental and somatic dysfunction of sacral region: Secondary | ICD-10-CM | POA: Diagnosis not present

## 2023-06-21 DIAGNOSIS — M62838 Other muscle spasm: Secondary | ICD-10-CM | POA: Diagnosis not present

## 2023-06-21 DIAGNOSIS — M9901 Segmental and somatic dysfunction of cervical region: Secondary | ICD-10-CM | POA: Diagnosis not present

## 2023-06-21 DIAGNOSIS — M9905 Segmental and somatic dysfunction of pelvic region: Secondary | ICD-10-CM | POA: Diagnosis not present

## 2023-06-21 DIAGNOSIS — M357 Hypermobility syndrome: Secondary | ICD-10-CM | POA: Diagnosis not present

## 2023-06-21 DIAGNOSIS — M9902 Segmental and somatic dysfunction of thoracic region: Secondary | ICD-10-CM | POA: Diagnosis not present

## 2023-06-21 DIAGNOSIS — G90A Postural orthostatic tachycardia syndrome (POTS): Secondary | ICD-10-CM | POA: Diagnosis not present

## 2023-06-22 ENCOUNTER — Other Ambulatory Visit (HOSPITAL_COMMUNITY): Payer: Self-pay

## 2023-06-22 ENCOUNTER — Other Ambulatory Visit (HOSPITAL_BASED_OUTPATIENT_CLINIC_OR_DEPARTMENT_OTHER): Payer: Self-pay

## 2023-06-24 ENCOUNTER — Other Ambulatory Visit (HOSPITAL_COMMUNITY): Payer: Self-pay

## 2023-06-27 ENCOUNTER — Encounter: Payer: Self-pay | Admitting: Family Medicine

## 2023-06-27 ENCOUNTER — Ambulatory Visit: Payer: 59 | Admitting: Family Medicine

## 2023-06-27 VITALS — BP 111/72 | HR 79 | Ht 68.75 in | Wt 174.5 lb

## 2023-06-27 DIAGNOSIS — S21001A Unspecified open wound of right breast, initial encounter: Secondary | ICD-10-CM | POA: Insufficient documentation

## 2023-06-27 DIAGNOSIS — A1801 Tuberculosis of spine: Secondary | ICD-10-CM | POA: Diagnosis not present

## 2023-06-27 DIAGNOSIS — F909 Attention-deficit hyperactivity disorder, unspecified type: Secondary | ICD-10-CM | POA: Insufficient documentation

## 2023-06-27 DIAGNOSIS — D509 Iron deficiency anemia, unspecified: Secondary | ICD-10-CM | POA: Diagnosis not present

## 2023-06-27 DIAGNOSIS — Z789 Other specified health status: Secondary | ICD-10-CM | POA: Insufficient documentation

## 2023-06-27 DIAGNOSIS — S21001S Unspecified open wound of right breast, sequela: Secondary | ICD-10-CM | POA: Diagnosis not present

## 2023-06-27 DIAGNOSIS — K219 Gastro-esophageal reflux disease without esophagitis: Secondary | ICD-10-CM | POA: Diagnosis not present

## 2023-06-27 NOTE — Assessment & Plan Note (Signed)
Followed by cards and doing well

## 2023-06-27 NOTE — Assessment & Plan Note (Signed)
Followed by psychiatry and doing well

## 2023-06-27 NOTE — Progress Notes (Signed)
Established patient visit   Patient: Amanda Gill   DOB: 05-10-1996   28 y.o. Female  MRN: [id number] Visit Date: 06/27/2023  Today's healthcare provider: Charlton Amor, DO   Chief Complaint  Patient presents with   New Patient (Initial Visit)    Establish Care    SUBJECTIVE    Chief Complaint  Patient presents with   New Patient (Initial Visit)    Establish Care   HPI HPI     New Patient (Initial Visit)    Additional comments: Establish Care      Last edited by Roselyn Reef, CMA on 06/27/2023  1:58 PM.       Pt presents to establish care. 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   From chart review it also looks like patient has a hx of adhd and is currently on adderall.   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  Respiratory:  Negative for cough and shortness of breath.   Cardiovascular:  Negative for chest pain.  Gastrointestinal:  Negative for abdominal pain.  Genitourinary:  Negative for difficulty urinating.  Skin:  Positive for wound.       Current Meds  Medication Sig   albuterol (VENTOLIN HFA) 108 (90 Base) MCG/ACT inhaler Inhale 2 puffs into the lungs every 4-6 hours as needed for wheezing or shortness of breath   albuterol (VENTOLIN HFA) 108 (90 Base) MCG/ACT inhaler Inhale 1 puff into the lungs every 6 (six) hours as needed.   amphetamine-dextroamphetamine (ADDERALL) 10 MG tablet Take 1 tablet (10 mg total) by mouth daily in the afternoon.   atenolol (TENORMIN) 25 MG tablet Take 1/2 tablet (12.5 mg total) by mouth daily.   atenolol (TENORMIN) 25 MG tablet Take 1 tablet (25 mg total) by mouth daily.   buPROPion (WELLBUTRIN XL) 300 MG 24 hr tablet Take 1 tablet (300 mg total) by mouth daily.   buPROPion (WELLBUTRIN XL) 300 MG 24 hr  tablet Take 1 tablet (300 mg total) by mouth daily.   buPROPion (WELLBUTRIN XL) 300 MG 24 hr tablet Take 1 tablet (300 mg total) by mouth daily.   buPROPion (WELLBUTRIN XL) 300 MG 24 hr tablet Take 1 tablet (300 mg total) by mouth daily.   celecoxib (CELEBREX) 100 MG capsule Take 1 capsule (100 mg total) by mouth 2 (two) times daily.   clobetasol ointment (TEMOVATE) 0.05 % Apply 1 Application topically 2 (two) times daily. Apply to affected areas of hands twice daily for 2 weeks. Take a 2 week break before restarting.   Efinaconazole 10 % SOLN Apply 1 drop topically daily.   EPINEPHrine 0.3 mg/0.3 mL IJ SOAJ injection Inject into the muscle.   gabapentin (NEURONTIN) 600 MG tablet Take one tablet by mouth every 8 hours for 5 days after surgery. For post op days 6-10.   Glycopyrronium Tosylate (QBREXZA) 2.4 % PADS Apply to underarms every night   ibuprofen (ADVIL) 600 MG tablet Take 1 tablet by mouth every 8 hours for 5 days after surgery as needed for pain.   imiquimod (ALDARA) 5 % cream Apply topically at bedtime.   levonorgestrel (KYLEENA) 19.5 MG IUD    lisdexamfetamine (VYVANSE) 50 MG capsule Take 1 capsule (50 mg total) by mouth every morning.   lisdexamfetamine (VYVANSE)  50 MG capsule Take 1 capsule (50 mg total) by mouth every morning.   lumateperone tosylate (CAPLYTA) 42 MG capsule Take 1 capsule (42 mg total) by mouth at bedtime.   lumateperone tosylate (CAPLYTA) 42 MG capsule Take 1 capsule (42 mg) by mouth daily.   lumateperone tosylate (CAPLYTA) 42 MG capsule Take 1 capsule (42 mg) by mouth daily.   lumateperone tosylate (CAPLYTA) 42 MG capsule Take 1 capsule (42 mg total) by mouth daily.   lumateperone tosylate (CAPLYTA) 42 MG capsule Take 1 capsule (42 mg total) by mouth daily.   lumateperone tosylate (CAPLYTA) 42 MG capsule Take 1 capsule (42 mg total) by mouth daily.   methylphenidate (QUILLICHEW ER) 20 MG CHER chewable tablet Take 1 tablet (20 mg total) by mouth in the morning.    Methylphenidate HCl (QUILLICHEW ER) 30 MG CHER chewable tablet Chew 1 tablet (30 mg total) by mouth in the morning. 04/14/23   Methylphenidate HCl (QUILLICHEW ER) 30 MG CHER chewable tablet Take 1 and 1/2 tablets (45 mg total) by mouth daily.   Methylphenidate HCl 10 MG CHEW Chew 1.5 tablets (15 mg total) by mouth daily in the afternoon as needed. 03/17/23   Methylphenidate HCl 10 MG CHEW Chew 1.5 tablets (15 mg total) by mouth daily in the afternoon as needed. 04/14/23   Methylphenidate HCl 10 MG CHEW Chew 1.5 tablets (15 mg total) by mouth daily in the afternoon as needed.   mirabegron ER (MYRBETRIQ) 50 MG TB24 tablet Take 1 tablet (50 mg total) by mouth daily.   NONFORMULARY OR COMPOUNDED ITEM [facility]: circulation   omeprazole (PRILOSEC) 40 MG capsule Take 1 capsule (40 mg total) by mouth in the morning.   oxyCODONE-acetaminophen (PERCOCET/ROXICET) 5-325 MG tablet Take 1 tablet by mouth every 4-6 hours as needed for post op pain   polyethylene glycol powder (MIRALAX) 17 GM/SCOOP powder Take 17g daily as directed.   Safety Seal Miscellaneous MISC Apply 1 application  topically in the morning. Hormonic Hair Solution   sildenafil (REVATIO) 20 MG tablet Take 3 tablets (60 mg total) by mouth daily.   Spacer/Aero-Holding Chambers (AEROCHAMBER MAX W/FLOW-VU) MISC Use spacer as directed with inhaler   Spacer/Aero-Holding Chambers (VORTEX VALVED HOLDING CHAMBER) DEVI Use 1 device as directed with inhaler   triamcinolone cream (KENALOG) 0.1 % Apply twice a day for 10 days   Current Facility-Administered Medications for the 06/27/23 encounter (Office Visit) with Charlton Amor, DO  Medication   triamcinolone acetonide (KENALOG) 10 MG/ML injection 10 mg    OBJECTIVE    BP 111/72 (BP Location: Left Arm, Patient Position: Sitting, Cuff Size: Normal)   Pulse 79   Ht 5' 8.75" (1.746 m)   Wt 174 lb 8 oz (79.2 kg)   LMP 05/30/2023 (Exact Date)   SpO2 100%   BMI 25.96 kg/m   Physical  Exam Vitals and nursing note reviewed.  Constitutional:      General: She is not in acute distress.    Appearance: Normal appearance.  HENT:     Head: Normocephalic and atraumatic.     Right Ear: External ear normal.     Left Ear: External ear normal.     Nose: Nose normal.  Eyes:     Conjunctiva/sclera: Conjunctivae normal.  Cardiovascular:     Rate and Rhythm: Normal rate and regular rhythm.  Pulmonary:     Effort: Pulmonary effort is normal.     Breath sounds: Normal breath sounds.  Skin:    Comments: Chaperone  present on exam of breast biopsy done at derm.   Neurological:     General: No focal deficit present.     Mental Status: She is alert and oriented to person, place, and time.  Psychiatric:        Mood and Affect: Mood normal.        Behavior: Behavior normal.        Thought Content: Thought content normal.        Judgment: Judgment normal.          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 Encouraged her to reach out to dermatology. No infection noted on my exam today and I have included a picture for reference.      Attention deficit hyperactivity disorder (ADHD)   Followed by psychiatry and doing well      Iron deficiency anemia - Primary   Pt says ferritin levels have been low. Has been on iron supplements in the past but says that they give her side effects - referral to hematology for possible iron infusion.      Relevant Orders   Ambulatory referral to Hematology / Oncology   Vegan diet    No follow-ups on file.      No orders of the defined types were placed in this encounter.   Orders Placed This  Encounter  Procedures   Ambulatory referral to Hematology / Oncology    Referral Priority:   Routine    Referral Reason:   Specialty Services Required    Requested Specialty:   Oncology    Number of Visits Requested:   1     Charlton Amor, DO  [facility] at [facility] [phone] (phone) [phone] (fax)  [facility]

## 2023-06-27 NOTE — Assessment & Plan Note (Signed)
Pt had biopsy done of mole on right breast and feels like it has gotten worse. Encouraged her to reach out to dermatology. No infection noted on my exam today and I have included a picture for reference.

## 2023-06-27 NOTE — Assessment & Plan Note (Signed)
On omeprazole and doing well

## 2023-06-27 NOTE — Assessment & Plan Note (Signed)
Pt says ferritin levels have been low. Has been on iron supplements in the past but says that they give her side effects - referral to hematology for possible iron infusion.

## 2023-07-01 ENCOUNTER — Other Ambulatory Visit (HOSPITAL_COMMUNITY): Payer: Self-pay

## 2023-07-02 ENCOUNTER — Other Ambulatory Visit: Payer: Self-pay

## 2023-07-02 ENCOUNTER — Other Ambulatory Visit (HOSPITAL_COMMUNITY): Payer: Self-pay

## 2023-07-02 MED ORDER — MUPIROCIN 2 % EX OINT
1.0000 | TOPICAL_OINTMENT | Freq: Two times a day (BID) | CUTANEOUS | 0 refills | Status: DC
  Filled 2023-07-02: qty 22, 11d supply, fill #0

## 2023-07-02 MED ORDER — DOXYCYCLINE HYCLATE 100 MG PO CAPS
ORAL_CAPSULE | ORAL | 0 refills | Status: DC
  Filled 2023-07-02: qty 14, 7d supply, fill #0

## 2023-07-02 NOTE — Telephone Encounter (Signed)
Hi Amanda Gill,  Let's send a script for mupirocin ointment.  Pt should keep clean with soap and water and apply mupirocin ointment and we will also send a script for doxy 100mg , take one pill PO BID with food for 7 days.  Thanks!

## 2023-07-03 ENCOUNTER — Other Ambulatory Visit (HOSPITAL_COMMUNITY): Payer: Self-pay

## 2023-07-04 ENCOUNTER — Other Ambulatory Visit (HOSPITAL_COMMUNITY): Payer: Self-pay

## 2023-07-05 ENCOUNTER — Other Ambulatory Visit (HOSPITAL_COMMUNITY): Payer: Self-pay

## 2023-07-05 DIAGNOSIS — Q7962 Hypermobile Ehlers-Danlos syndrome: Secondary | ICD-10-CM | POA: Diagnosis not present

## 2023-07-16 ENCOUNTER — Other Ambulatory Visit: Payer: Self-pay | Admitting: Family

## 2023-07-16 DIAGNOSIS — D509 Iron deficiency anemia, unspecified: Secondary | ICD-10-CM

## 2023-07-17 ENCOUNTER — Inpatient Hospital Stay (HOSPITAL_BASED_OUTPATIENT_CLINIC_OR_DEPARTMENT_OTHER): Payer: 59 | Admitting: Family

## 2023-07-17 ENCOUNTER — Encounter: Payer: Self-pay | Admitting: Family

## 2023-07-17 ENCOUNTER — Inpatient Hospital Stay: Payer: 59 | Attending: Hematology & Oncology

## 2023-07-17 VITALS — BP 113/76 | HR 71 | Temp 98.7°F | Resp 17 | Ht 68.5 in | Wt 174.8 lb

## 2023-07-17 DIAGNOSIS — D509 Iron deficiency anemia, unspecified: Secondary | ICD-10-CM | POA: Insufficient documentation

## 2023-07-17 DIAGNOSIS — I7300 Raynaud's syndrome without gangrene: Secondary | ICD-10-CM | POA: Insufficient documentation

## 2023-07-17 DIAGNOSIS — G90A Postural orthostatic tachycardia syndrome (POTS): Secondary | ICD-10-CM

## 2023-07-17 DIAGNOSIS — Q796 Ehlers-Danlos syndrome, unspecified: Secondary | ICD-10-CM | POA: Diagnosis not present

## 2023-07-17 LAB — IRON AND IRON BINDING CAPACITY (CC-WL,HP ONLY)
Iron: 52 ug/dL (ref 28–170)
Saturation Ratios: 15 % (ref 10.4–31.8)
TIBC: 357 ug/dL (ref 250–450)
UIBC: 305 ug/dL (ref 148–442)

## 2023-07-17 LAB — CMP (CANCER CENTER ONLY)
ALT: 7 U/L (ref 0–44)
AST: 12 U/L — ABNORMAL LOW (ref 15–41)
Albumin: 4.3 g/dL (ref 3.5–5.0)
Alkaline Phosphatase: 46 U/L (ref 38–126)
Anion gap: 6 (ref 5–15)
BUN: 11 mg/dL (ref 6–20)
CO2: 27 mmol/L (ref 22–32)
Calcium: 9.7 mg/dL (ref 8.9–10.3)
Chloride: 109 mmol/L (ref 98–111)
Creatinine: 0.81 mg/dL (ref 0.44–1.00)
GFR, Estimated: >60 mL/min (ref >60)
Glucose, Bld: 65 mg/dL — ABNORMAL LOW (ref 70–99)
Potassium: 4.1 mmol/L (ref 3.5–5.1)
Sodium: 142 mmol/L (ref 135–145)
Total Bilirubin: 1.1 mg/dL (ref 0.0–1.2)
Total Protein: 6.7 g/dL (ref 6.5–8.1)

## 2023-07-17 LAB — CBC WITH DIFFERENTIAL (CANCER CENTER ONLY)
Abs Immature Granulocytes: 0.01 10*3/uL (ref 0.00–0.07)
Basophils Absolute: 0 10*3/uL (ref 0.0–0.1)
Basophils Relative: 1 %
Eosinophils Absolute: 0.1 10*3/uL (ref 0.0–0.5)
Eosinophils Relative: 2 %
HCT: 37.6 % (ref 36.0–46.0)
Hemoglobin: 12.9 g/dL (ref 12.0–15.0)
Immature Granulocytes: 0 %
Lymphocytes Relative: 54 %
Lymphs Abs: 2.2 10*3/uL (ref 0.7–4.0)
MCH: 30.3 pg (ref 26.0–34.0)
MCHC: 34.3 g/dL (ref 30.0–36.0)
MCV: 88.3 fL (ref 80.0–100.0)
Monocytes Absolute: 0.3 10*3/uL (ref 0.1–1.0)
Monocytes Relative: 7 %
Neutro Abs: 1.5 10*3/uL — ABNORMAL LOW (ref 1.7–7.7)
Neutrophils Relative %: 36 %
Platelet Count: 235 10*3/uL (ref 150–400)
RBC: 4.26 MIL/uL (ref 3.87–5.11)
RDW: 12.9 % (ref 11.5–15.5)
WBC Count: 4.1 10*3/uL (ref 4.0–10.5)
nRBC: 0 % (ref 0.0–0.2)

## 2023-07-17 LAB — RETICULOCYTES
Immature Retic Fract: 3.7 % (ref 2.3–15.9)
RBC.: 4.22 MIL/uL (ref 3.87–5.11)
Retic Count, Absolute: 67.9 10*3/uL (ref 19.0–186.0)
Retic Ct Pct: 1.6 % (ref 0.4–3.1)

## 2023-07-17 LAB — FERRITIN: Ferritin: 16 ng/mL (ref 11–307)

## 2023-07-17 NOTE — Progress Notes (Signed)
Hematology/Oncology Consultation   Name: Amanda Gill      MRN: [id number]    Location: Room/bed info not found  Date: 07/17/2023 Time:9:20 AM   REFERRING PHYSICIAN:  Morey Hummingbird, DO  REASON FOR CONSULT:  Iron deficiency anemia    DIAGNOSIS: Iron deficiency without anemia  HISTORY OF PRESENT ILLNESS: Amanda Gill is a pleasant 28 yo female with history of iron deficiency without anemia.  She has been unable to tolerate oral iron in the past due to GI upset and is interested in IV iron if needed.  She notes fatigue and chills.  She also has restless leg syndrome and states that her PCP feels a ferritin of 50 may help improve her symptoms.  Her cycle is regular and since having her IUD placed flow seems to be lighter.  No other blood loss noted recently. No abnormal bruising, no petechiae.  Her mother also has anemia. Her father has history of sickle cell trait.  Endoscopy in 10/2022 was normal. She is on Prilosec for GERD.  Surgical history includes bilateral hip surgeries for hip dysplasia without any complications.  She also had a mole removed from the left breast and this had to be cauterized by urgent care due to oozing blood at the site. Biopsy with surgery showed abnormal cells but not cancerous. She states that this continues to heal and is now dime sized, dry and intact. No issues with frequent or recurrent infections.  She has POTS as well as Ehlers-Danlos Syndrome.  She has episodes of dizziness associated with POTS when standing too quickly. Thankfully she has not had any syncopal episodes.  She wears compression stockings for added support.  She has Raynaud's and notes numbness and tingling in her feet anda sometimes her fingers.  No history of pregnancy or miscarriage.  No personal or known familial history of cancer.  No history of thyroid disease or diabetes.  No fever, chills, n/v, cough, rash, chest pain, palpitations, abdominal pain or changes in bowel or bladder  habits.  She notes that with her endometriosis she has issues with constipation especially around her cycle. She increases her fiber as needed which seems to help.  No smoking, ETOH or recreational drug use.  She is Vegan. Appetite and hydration are good. Weight is stable at 174 lbs.  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  ROS: All other 10 point review of systems is negative.   PAST MEDICAL HISTORY:   Past Medical History:  Diagnosis Date   Asthma     ALLERGIES: Allergies  Allergen Reactions   Chlorhexidine Other (See Comments), Itching, Rash and Shortness Of Breath    With shower prep night before surgery With shower prep night before surgery With shower prep night before surgery    Iodine Shortness Of Breath    Patient reported flushed sensation and shortness of breath following iodine for CT imaging.  No hives or airway swelling.   Lactose Anaphylaxis and Diarrhea   Cortisone Other (See Comments)    Causes flare of bipolar symptoms. Avoid corticosteroids when possible, especially kenalog  Behavioral issues   Lactose Intolerance (Gi) Other (See Comments)    throat swells/stomach hurts/constipation   Milk (Cow) Other (See Comments)   Triamcinolone Acetonide     Behavioral issues      MEDICATIONS:  Current Outpatient Medications on File Prior to Visit  Medication Sig Dispense Refill   albuterol (VENTOLIN HFA) 108 (90 Base) MCG/ACT inhaler Inhale 1 puff into the  lungs every 6 (six) hours as needed. 6.7 g 1   amphetamine-dextroamphetamine (ADDERALL) 10 MG tablet Take 1 tablet (10 mg total) by mouth daily in the afternoon. 30 tablet 0   atenolol (TENORMIN) 25 MG tablet Take 1 tablet (25 mg total) by mouth daily. 30 tablet 5   buPROPion (WELLBUTRIN XL) 300 MG 24 hr tablet Take 1 tablet (300 mg total) by mouth daily. 30 tablet 2   Glycopyrronium Tosylate (QBREXZA) 2.4 % PADS Apply to underarms every night 30 each 3   ibuprofen (ADVIL) 200 MG tablet Take 400 mg  by mouth every 6 (six) hours as needed.     imiquimod (ALDARA) 5 % cream Apply topically at bedtime. (Patient taking differently: Apply topically at bedtime. As needed) 12 each 0   levonorgestrel (MIRENA) 20 MCG/DAY IUD 1 each by Intrauterine route once.     lisdexamfetamine (VYVANSE) 50 MG capsule Take 1 capsule (50 mg total) by mouth every morning. 30 capsule 0   lumateperone tosylate (CAPLYTA) 42 MG capsule Take 1 capsule (42 mg total) by mouth daily. 30 capsule 2   mupirocin ointment (BACTROBAN) 2 % Apply 1 Application topically 2 (two) times daily. (Patient taking differently: Apply 1 Application topically 2 (two) times daily. As needed) 22 g 0   omeprazole (PRILOSEC) 40 MG capsule Take 1 capsule (40 mg total) by mouth in the morning. 90 capsule 3   Safety Seal Miscellaneous MISC Apply 1 application  topically in the morning. Hormonic Hair Solution 30 mL 3   Spacer/Aero-Holding Chambers (AEROCHAMBER MAX W/FLOW-VU) MISC Use spacer as directed with inhaler 1 each 1   albuterol (VENTOLIN HFA) 108 (90 Base) MCG/ACT inhaler Inhale 2 puffs into the lungs every 4-6 hours as needed for wheezing or shortness of breath (Patient not taking: Reported on 07/17/2023) 6.7 g 2   atenolol (TENORMIN) 25 MG tablet Take 1/2 tablet (12.5 mg total) by mouth daily. (Patient not taking: Reported on 07/17/2023) 30 tablet 5   buPROPion (WELLBUTRIN XL) 300 MG 24 hr tablet Take 1 tablet (300 mg total) by mouth daily. (Patient not taking: Reported on 07/17/2023) 30 tablet 1   buPROPion (WELLBUTRIN XL) 300 MG 24 hr tablet Take 1 tablet (300 mg total) by mouth daily. (Patient not taking: Reported on 07/17/2023) 30 tablet 1   buPROPion (WELLBUTRIN XL) 300 MG 24 hr tablet Take 1 tablet (300 mg total) by mouth daily. (Patient not taking: Reported on 07/17/2023) 30 tablet 2   celecoxib (CELEBREX) 100 MG capsule Take 1 capsule (100 mg total) by mouth 2 (two) times daily. (Patient not taking: Reported on 07/17/2023) 60 capsule 0    clobetasol ointment (TEMOVATE) 0.05 % Apply 1 Application topically 2 (two) times daily. Apply to affected areas of hands twice daily for 2 weeks. Take a 2 week break before restarting. (Patient not taking: Reported on 07/17/2023) 30 g 1   doxycycline (VIBRAMYCIN) 100 MG capsule Take 1 twice a day with food for 7 days (Patient not taking: Reported on 07/17/2023) 14 capsule 0   Efinaconazole 10 % SOLN Apply 1 drop topically daily. (Patient not taking: Reported on 07/17/2023) 4 mL 11   EPINEPHrine 0.3 mg/0.3 mL IJ SOAJ injection Inject into the muscle. (Patient not taking: Reported on 07/17/2023)     gabapentin (NEURONTIN) 600 MG tablet Take one tablet by mouth every 8 hours for 5 days after surgery. For post op days 6-10. (Patient not taking: Reported on 07/17/2023) 30 tablet 0   ibuprofen (ADVIL) 600 MG  tablet Take 1 tablet by mouth every 8 hours for 5 days after surgery as needed for pain. (Patient not taking: Reported on 07/17/2023) 45 tablet 0   levonorgestrel (KYLEENA) 19.5 MG IUD  (Patient not taking: Reported on 07/17/2023)     lisdexamfetamine (VYVANSE) 50 MG capsule Take 1 capsule (50 mg total) by mouth every morning. (Patient not taking: Reported on 07/17/2023) 30 capsule 0   lumateperone tosylate (CAPLYTA) 42 MG capsule Take 1 capsule (42 mg total) by mouth at bedtime. (Patient not taking: Reported on 07/17/2023) 30 capsule 2   lumateperone tosylate (CAPLYTA) 42 MG capsule Take 1 capsule (42 mg) by mouth daily. (Patient not taking: Reported on 07/17/2023) 30 capsule 2   lumateperone tosylate (CAPLYTA) 42 MG capsule Take 1 capsule (42 mg) by mouth daily. (Patient not taking: Reported on 07/17/2023) 30 capsule 2   lumateperone tosylate (CAPLYTA) 42 MG capsule Take 1 capsule (42 mg total) by mouth daily. (Patient not taking: Reported on 07/17/2023) 30 capsule 2   lumateperone tosylate (CAPLYTA) 42 MG capsule Take 1 capsule (42 mg total) by mouth daily. (Patient not taking: Reported on 07/17/2023) 30 capsule 2    methylphenidate (QUILLICHEW ER) 20 MG CHER chewable tablet Take 1 tablet (20 mg total) by mouth in the morning. (Patient not taking: Reported on 07/17/2023) 30 tablet 0   Methylphenidate HCl (QUILLICHEW ER) 30 MG CHER chewable tablet Chew 1 tablet (30 mg total) by mouth in the morning. 04/14/23 (Patient not taking: Reported on 07/17/2023) 30 tablet 0   Methylphenidate HCl (QUILLICHEW ER) 30 MG CHER chewable tablet Take 1 and 1/2 tablets (45 mg total) by mouth daily. (Patient not taking: Reported on 07/17/2023) 45 tablet 0   Methylphenidate HCl 10 MG CHEW Chew 1.5 tablets (15 mg total) by mouth daily in the afternoon as needed. 03/17/23 (Patient not taking: Reported on 07/17/2023) 45 tablet 0   Methylphenidate HCl 10 MG CHEW Chew 1.5 tablets (15 mg total) by mouth daily in the afternoon as needed. 04/14/23 (Patient not taking: Reported on 07/17/2023) 45 tablet 0   Methylphenidate HCl 10 MG CHEW Chew 1.5 tablets (15 mg total) by mouth daily in the afternoon as needed. (Patient not taking: Reported on 07/17/2023) 45 tablet 0   mirabegron ER (MYRBETRIQ) 50 MG TB24 tablet Take 1 tablet (50 mg total) by mouth daily. (Patient not taking: Reported on 07/17/2023) 90 tablet 3   NONFORMULARY OR COMPOUNDED ITEM [facility]: circulation (Patient not taking: Reported on 07/17/2023) 10 each 0   oxyCODONE-acetaminophen (PERCOCET/ROXICET) 5-325 MG tablet Take 1 tablet by mouth every 4-6 hours as needed for post op pain (Patient not taking: Reported on 07/17/2023) 5 tablet 0   polyethylene glycol powder (MIRALAX) 17 GM/SCOOP powder Take 17g daily as directed. (Patient not taking: Reported on 07/17/2023) 476 g 0   sildenafil (REVATIO) 20 MG tablet Take 3 tablets (60 mg total) by mouth daily. (Patient not taking: Reported on 07/17/2023) 90 tablet 3   Spacer/Aero-Holding Chambers (VORTEX VALVED HOLDING CHAMBER) DEVI Use 1 device as directed with inhaler (Patient not taking: Reported on 07/17/2023) 1 each 0   triamcinolone  cream (KENALOG) 0.1 % Apply twice a day for 10 days (Patient not taking: Reported on 07/17/2023) 80 g 2   [DISCONTINUED] esomeprazole (NEXIUM) 40 MG packet Take 1 packet (40 mg total) by mouth every morning before breakfast. Please ensure that this is lactose free.  Patient has severe lactose allergy 90 each 3   [DISCONTINUED] norethindrone (MICRONOR) 0.35 MG tablet  Take 1 tablet (0.35 mg total) by mouth daily. (Patient not taking: Reported on 12/17/2022) 84 tablet 3   [DISCONTINUED] pantoprazole (PROTONIX) 40 MG tablet Take 1 tablet (40 mg total) by mouth every morning before breakfast. 30 tablet 3   [DISCONTINUED] sucralfate (CARAFATE) 1 g tablet Take 1 tablet (1 g total) by mouth 4 (four) times a day. 360 tablet 0   No current facility-administered medications on file prior to visit.     PAST SURGICAL HISTORY Past Surgical History:  Procedure Laterality Date   DENTAL SURGERY     HIP SURGERY Bilateral    UPPER GASTROINTESTINAL ENDOSCOPY  2024    FAMILY HISTORY: No family history on file.  SOCIAL HISTORY:  reports that she has never smoked. She has never been exposed to tobacco smoke. She has never used smokeless tobacco. She reports that she does not drink alcohol and does not use drugs.  PERFORMANCE STATUS: The patient's performance status is 1 - Symptomatic but completely ambulatory  PHYSICAL EXAM: Most Recent Vital Signs: Blood pressure 113/76, pulse 71, temperature 98.7 F (37.1 C), temperature source Oral, resp. rate 17, height 5' 8.5" (1.74 m), weight 174 lb 12.8 oz (79.3 kg), last menstrual period 05/30/2023, SpO2 100%. BP 113/76 (BP Location: Left Arm, Patient Position: Sitting, Cuff Size: Normal)   Pulse 71   Temp 98.7 F (37.1 C) (Oral)   Resp 17   Ht 5' 8.5" (1.74 m)   Wt 174 lb 12.8 oz (79.3 kg)   LMP 05/30/2023 (Exact Date)   SpO2 100%   BMI 26.19 kg/m   General Appearance:    Alert, cooperative, no distress, appears stated age  Head:    Normocephalic, without  obvious abnormality, atraumatic  Eyes:    PERRL, conjunctiva/corneas clear, EOM's intact, fundi    benign, both eyes        Throat:   Lips, mucosa, and tongue normal; teeth and gums normal  Neck:   Supple, symmetrical, trachea midline, no adenopathy;    thyroid:  no enlargement/tenderness/nodules; no carotid   bruit or JVD  Back:     Symmetric, no curvature, ROM normal, no CVA tenderness  Lungs:     Clear to auscultation bilaterally, respirations unlabored  Chest Wall:    No tenderness or deformity   Heart:    Regular rate and rhythm, S1 and S2 normal, no murmur, rub   or gallop     Abdomen:     Soft, non-tender, bowel sounds active all four quadrants,    no masses, no organomegaly        Extremities:   Extremities normal, atraumatic, no cyanosis or edema  Pulses:   2+ and symmetric all extremities  Skin:   Skin color, texture, turgor normal, no rashes or lesions  Lymph nodes:   Cervical, supraclavicular, and axillary nodes normal  Neurologic:   CNII-XII intact, normal strength, sensation and reflexes    throughout    LABORATORY DATA:  Results for orders placed or performed in visit on 07/17/23 (from the past 48 hours)  CBC with Differential (Cancer Center Only)     Status: Abnormal   Collection Time: 07/17/23  8:54 AM  Result Value Ref Range   WBC Count 4.1 4.0 - 10.5 K/uL   RBC 4.26 3.87 - 5.11 MIL/uL   Hemoglobin 12.9 12.0 - 15.0 g/dL   HCT 40.9 81.1 - 91.4 %   MCV 88.3 80.0 - 100.0 fL   MCH 30.3 26.0 - 34.0 pg   MCHC  34.3 30.0 - 36.0 g/dL   RDW 09.8 11.9 - 14.7 %   Platelet Count 235 150 - 400 K/uL   nRBC 0.0 0.0 - 0.2 %   Neutrophils Relative % 36 %   Neutro Abs 1.5 (L) 1.7 - 7.7 K/uL   Lymphocytes Relative 54 %   Lymphs Abs 2.2 0.7 - 4.0 K/uL   Monocytes Relative 7 %   Monocytes Absolute 0.3 0.1 - 1.0 K/uL   Eosinophils Relative 2 %   Eosinophils Absolute 0.1 0.0 - 0.5 K/uL   Basophils Relative 1 %   Basophils Absolute 0.0 0.0 - 0.1 K/uL   Immature  Granulocytes 0 %   Abs Immature Granulocytes 0.01 0.00 - 0.07 K/uL    Comment: Performed at [facility] at [facility], [address] [place], [place] [address]  Reticulocytes     Status: None   Collection Time: 07/17/23  8:55 AM  Result Value Ref Range   Retic Ct Pct 1.6 0.4 - 3.1 %   RBC. 4.22 3.87 - 5.11 MIL/uL   Retic Count, Absolute 67.9 19.0 - 186.0 K/uL   Immature Retic Fract 3.7 2.3 - 15.9 %    Comment: Performed at [facility] at [facility], [address], [place], [place] [address]      RADIOGRAPHY: No results found.     PATHOLOGY: None  ASSESSMENT/PLAN: Ms. Lallier is a pleasant 28 yo female with history of iron deficiency without anemia.  Iron studies are pending. We will replace if needed to maintain ferritin > 50.  Follow-up pending results.   All questions were answered. The patient knows to call the clinic with any problems, questions or concerns. We can certainly see the patient much sooner if necessary.   Eileen Stanford, NP

## 2023-07-19 DIAGNOSIS — M9903 Segmental and somatic dysfunction of lumbar region: Secondary | ICD-10-CM | POA: Diagnosis not present

## 2023-07-19 DIAGNOSIS — M9907 Segmental and somatic dysfunction of upper extremity: Secondary | ICD-10-CM | POA: Diagnosis not present

## 2023-07-19 DIAGNOSIS — M9902 Segmental and somatic dysfunction of thoracic region: Secondary | ICD-10-CM | POA: Diagnosis not present

## 2023-07-19 DIAGNOSIS — M9904 Segmental and somatic dysfunction of sacral region: Secondary | ICD-10-CM | POA: Diagnosis not present

## 2023-07-19 DIAGNOSIS — M62838 Other muscle spasm: Secondary | ICD-10-CM | POA: Diagnosis not present

## 2023-07-19 DIAGNOSIS — M9905 Segmental and somatic dysfunction of pelvic region: Secondary | ICD-10-CM | POA: Diagnosis not present

## 2023-07-19 DIAGNOSIS — M9901 Segmental and somatic dysfunction of cervical region: Secondary | ICD-10-CM | POA: Diagnosis not present

## 2023-07-19 DIAGNOSIS — M9906 Segmental and somatic dysfunction of lower extremity: Secondary | ICD-10-CM | POA: Diagnosis not present

## 2023-07-19 DIAGNOSIS — M9900 Segmental and somatic dysfunction of head region: Secondary | ICD-10-CM | POA: Diagnosis not present

## 2023-07-19 DIAGNOSIS — M9908 Segmental and somatic dysfunction of rib cage: Secondary | ICD-10-CM | POA: Diagnosis not present

## 2023-07-22 ENCOUNTER — Encounter: Payer: Self-pay | Admitting: Family

## 2023-07-22 ENCOUNTER — Telehealth: Payer: Self-pay | Admitting: Family

## 2023-07-22 NOTE — Telephone Encounter (Signed)
 Called to schedule follow up and IV Iron. LVM to return call for scheduling.

## 2023-07-23 DIAGNOSIS — Z113 Encounter for screening for infections with a predominantly sexual mode of transmission: Secondary | ICD-10-CM | POA: Diagnosis not present

## 2023-07-23 DIAGNOSIS — Z01419 Encounter for gynecological examination (general) (routine) without abnormal findings: Secondary | ICD-10-CM | POA: Diagnosis not present

## 2023-07-24 ENCOUNTER — Other Ambulatory Visit (HOSPITAL_COMMUNITY): Payer: Self-pay

## 2023-07-24 DIAGNOSIS — F902 Attention-deficit hyperactivity disorder, combined type: Secondary | ICD-10-CM | POA: Diagnosis not present

## 2023-07-24 DIAGNOSIS — F319 Bipolar disorder, unspecified: Secondary | ICD-10-CM | POA: Diagnosis not present

## 2023-07-24 DIAGNOSIS — F4310 Post-traumatic stress disorder, unspecified: Secondary | ICD-10-CM | POA: Diagnosis not present

## 2023-07-24 MED ORDER — LISDEXAMFETAMINE DIMESYLATE 50 MG PO CAPS
50.0000 mg | ORAL_CAPSULE | Freq: Every day | ORAL | 0 refills | Status: DC
  Filled 2023-07-24: qty 30, 30d supply, fill #0

## 2023-07-24 MED ORDER — AMPHETAMINE-DEXTROAMPHETAMINE 10 MG PO TABS
ORAL_TABLET | ORAL | 0 refills | Status: DC
  Filled 2023-07-24: qty 30, 30d supply, fill #0

## 2023-07-26 ENCOUNTER — Inpatient Hospital Stay: Payer: 59

## 2023-07-26 VITALS — BP 114/73 | HR 66 | Temp 98.7°F | Resp 18

## 2023-07-26 DIAGNOSIS — D509 Iron deficiency anemia, unspecified: Secondary | ICD-10-CM | POA: Diagnosis not present

## 2023-07-26 DIAGNOSIS — G90A Postural orthostatic tachycardia syndrome (POTS): Secondary | ICD-10-CM | POA: Diagnosis not present

## 2023-07-26 DIAGNOSIS — Q796 Ehlers-Danlos syndrome, unspecified: Secondary | ICD-10-CM | POA: Diagnosis not present

## 2023-07-26 DIAGNOSIS — I7300 Raynaud's syndrome without gangrene: Secondary | ICD-10-CM | POA: Diagnosis not present

## 2023-07-26 MED ORDER — SODIUM CHLORIDE 0.9 % IV SOLN: INTRAVENOUS | Status: DC

## 2023-07-26 MED ORDER — SODIUM CHLORIDE 0.9 % IV SOLN
300.0000 mg | Freq: Once | INTRAVENOUS | Status: AC
  Administered 2023-07-26: 300 mg via INTRAVENOUS
  Filled 2023-07-26: qty 300

## 2023-07-26 NOTE — Progress Notes (Signed)
 First time Venofer. Education complete. Tolerated tmt well. No issues. Reviewed AVS.

## 2023-07-26 NOTE — Patient Instructions (Signed)
 [facility] - A DEPT OF [facility]  Discharge Instructions: Thank you for choosing [facility] to provide your oncology and hematology care.   If you have a lab appointment with the Cancer Center, please go directly to the Cancer Center and check in at the registration area.  Wear comfortable clothing and clothing appropriate for easy access to any Portacath or PICC line.   We strive to give you quality time with your provider. You may need to reschedule your appointment if you arrive late (15 or more minutes).  Arriving late affects you and other patients whose appointments are after yours.  Also, if you miss three or more appointments without notifying the office, you may be dismissed from the clinic at the provider's discretion.      For prescription refill requests, have your pharmacy contact our office and allow 72 hours for refills to be completed.    Today you received the following  agents Venofer      To help prevent nausea and vomiting after your treatment, we encourage you to take your nausea medication as directed.  BELOW ARE SYMPTOMS THAT SHOULD BE REPORTED IMMEDIATELY: *FEVER GREATER THAN 100.4 F (38 C) OR HIGHER *CHILLS OR SWEATING *NAUSEA AND VOMITING THAT IS NOT CONTROLLED WITH YOUR NAUSEA MEDICATION *UNUSUAL SHORTNESS OF BREATH *UNUSUAL BRUISING OR BLEEDING *URINARY PROBLEMS (pain or burning when urinating, or frequent urination) *BOWEL PROBLEMS (unusual diarrhea, constipation, pain near the anus) TENDERNESS IN MOUTH AND THROAT WITH OR WITHOUT PRESENCE OF ULCERS (sore throat, sores in mouth, or a toothache) UNUSUAL RASH, SWELLING OR PAIN  UNUSUAL VAGINAL DISCHARGE OR ITCHING   Items with * indicate a potential emergency and should be followed up as soon as possible or go to the Emergency Department if any problems should occur.  Please show the CHEMOTHERAPY ALERT CARD or IMMUNOTHERAPY ALERT CARD at check-in to the  Emergency Department and triage nurse. Should you have questions after your visit or need to cancel or reschedule your appointment, please contact [facility] - A DEPT OF [facility]  [phone] and follow the prompts.  Office hours are 8:00 a.m. to 4:30 p.m. Monday - Friday. Please note that voicemails left after 4:00 p.m. may not be returned until the following business day.  We are closed weekends and major holidays. You have access to a nurse at all times for urgent questions. Please call the main number to the clinic [phone] and follow the prompts.  For any non-urgent questions, you may also contact your provider using MyChart. We now offer e-Visits for anyone 79 and older to request care online for non-urgent symptoms. For details visit [email or web address].   Also download the MyChart app! Go to the app store, search "MyChart", open the app, select , and log in with your MyChart username and password.

## 2023-07-29 ENCOUNTER — Other Ambulatory Visit: Payer: Self-pay

## 2023-07-30 ENCOUNTER — Other Ambulatory Visit: Payer: Self-pay

## 2023-07-30 DIAGNOSIS — J342 Deviated nasal septum: Secondary | ICD-10-CM | POA: Diagnosis not present

## 2023-07-30 DIAGNOSIS — J343 Hypertrophy of nasal turbinates: Secondary | ICD-10-CM | POA: Diagnosis not present

## 2023-07-30 DIAGNOSIS — R0982 Postnasal drip: Secondary | ICD-10-CM | POA: Diagnosis not present

## 2023-07-30 DIAGNOSIS — R0981 Nasal congestion: Secondary | ICD-10-CM | POA: Diagnosis not present

## 2023-07-30 DIAGNOSIS — J3489 Other specified disorders of nose and nasal sinuses: Secondary | ICD-10-CM | POA: Diagnosis not present

## 2023-08-01 ENCOUNTER — Inpatient Hospital Stay: Payer: 59 | Attending: Hematology & Oncology

## 2023-08-01 VITALS — BP 101/64 | HR 86 | Temp 98.9°F | Resp 16

## 2023-08-01 DIAGNOSIS — E611 Iron deficiency: Secondary | ICD-10-CM | POA: Diagnosis not present

## 2023-08-01 DIAGNOSIS — D509 Iron deficiency anemia, unspecified: Secondary | ICD-10-CM

## 2023-08-01 MED ORDER — SODIUM CHLORIDE 0.9 % IV SOLN
300.0000 mg | Freq: Once | INTRAVENOUS | Status: AC
  Administered 2023-08-01: 300 mg via INTRAVENOUS
  Filled 2023-08-01: qty 300

## 2023-08-01 MED ORDER — SODIUM CHLORIDE 0.9 % IV SOLN: INTRAVENOUS | Status: DC

## 2023-08-01 NOTE — Patient Instructions (Signed)

## 2023-08-06 ENCOUNTER — Other Ambulatory Visit (HOSPITAL_COMMUNITY): Payer: Self-pay

## 2023-08-07 ENCOUNTER — Other Ambulatory Visit (HOSPITAL_COMMUNITY): Payer: Self-pay

## 2023-08-07 ENCOUNTER — Other Ambulatory Visit: Payer: Self-pay

## 2023-08-08 ENCOUNTER — Other Ambulatory Visit (HOSPITAL_BASED_OUTPATIENT_CLINIC_OR_DEPARTMENT_OTHER): Payer: Self-pay

## 2023-08-13 ENCOUNTER — Inpatient Hospital Stay: Payer: 59

## 2023-08-13 VITALS — BP 109/66 | HR 71 | Temp 98.0°F | Resp 20

## 2023-08-13 DIAGNOSIS — E611 Iron deficiency: Secondary | ICD-10-CM | POA: Diagnosis not present

## 2023-08-13 DIAGNOSIS — D509 Iron deficiency anemia, unspecified: Secondary | ICD-10-CM

## 2023-08-13 MED ORDER — IRON SUCROSE 20 MG/ML IV SOLN
300.0000 mg | Freq: Once | INTRAVENOUS | Status: AC
  Administered 2023-08-13: 300 mg via INTRAVENOUS
  Filled 2023-08-13: qty 300

## 2023-08-13 MED ORDER — SODIUM CHLORIDE 0.9 % IV SOLN
INTRAVENOUS | Status: DC
Start: 2023-08-13 — End: 2023-08-13

## 2023-08-13 NOTE — Patient Instructions (Signed)

## 2023-08-13 NOTE — Progress Notes (Signed)
 Pt refused to stay for 30 minute post iron infusion observation.  Pt without complaints at time of discharge.

## 2023-08-15 ENCOUNTER — Other Ambulatory Visit: Payer: Self-pay

## 2023-08-15 ENCOUNTER — Other Ambulatory Visit (HOSPITAL_COMMUNITY): Payer: Self-pay

## 2023-08-16 DIAGNOSIS — M9906 Segmental and somatic dysfunction of lower extremity: Secondary | ICD-10-CM | POA: Diagnosis not present

## 2023-08-16 DIAGNOSIS — M9908 Segmental and somatic dysfunction of rib cage: Secondary | ICD-10-CM | POA: Diagnosis not present

## 2023-08-16 DIAGNOSIS — M9907 Segmental and somatic dysfunction of upper extremity: Secondary | ICD-10-CM | POA: Diagnosis not present

## 2023-08-16 DIAGNOSIS — M9905 Segmental and somatic dysfunction of pelvic region: Secondary | ICD-10-CM | POA: Diagnosis not present

## 2023-08-16 DIAGNOSIS — M9904 Segmental and somatic dysfunction of sacral region: Secondary | ICD-10-CM | POA: Diagnosis not present

## 2023-08-16 DIAGNOSIS — M9901 Segmental and somatic dysfunction of cervical region: Secondary | ICD-10-CM | POA: Diagnosis not present

## 2023-08-16 DIAGNOSIS — G629 Polyneuropathy, unspecified: Secondary | ICD-10-CM | POA: Diagnosis not present

## 2023-08-16 DIAGNOSIS — M9902 Segmental and somatic dysfunction of thoracic region: Secondary | ICD-10-CM | POA: Diagnosis not present

## 2023-08-16 DIAGNOSIS — M9903 Segmental and somatic dysfunction of lumbar region: Secondary | ICD-10-CM | POA: Diagnosis not present

## 2023-08-16 DIAGNOSIS — M9900 Segmental and somatic dysfunction of head region: Secondary | ICD-10-CM | POA: Diagnosis not present

## 2023-08-19 DIAGNOSIS — J310 Chronic rhinitis: Secondary | ICD-10-CM | POA: Diagnosis not present

## 2023-08-19 DIAGNOSIS — J4530 Mild persistent asthma, uncomplicated: Secondary | ICD-10-CM | POA: Diagnosis not present

## 2023-08-21 ENCOUNTER — Other Ambulatory Visit (HOSPITAL_COMMUNITY): Payer: Self-pay

## 2023-08-21 DIAGNOSIS — F319 Bipolar disorder, unspecified: Secondary | ICD-10-CM | POA: Diagnosis not present

## 2023-08-21 DIAGNOSIS — F902 Attention-deficit hyperactivity disorder, combined type: Secondary | ICD-10-CM | POA: Diagnosis not present

## 2023-08-21 MED ORDER — AMPHETAMINE-DEXTROAMPHETAMINE 10 MG PO TABS
10.0000 mg | ORAL_TABLET | Freq: Every day | ORAL | 0 refills | Status: DC
  Filled 2023-08-23: qty 30, 30d supply, fill #0

## 2023-08-21 MED ORDER — CAPLYTA 42 MG PO CAPS
42.0000 mg | ORAL_CAPSULE | Freq: Every day | ORAL | 2 refills | Status: DC
  Filled 2023-10-14: qty 30, 30d supply, fill #0
  Filled 2023-11-11: qty 30, 30d supply, fill #1
  Filled 2023-12-06: qty 30, 30d supply, fill #2

## 2023-08-21 MED ORDER — LISDEXAMFETAMINE DIMESYLATE 50 MG PO CAPS
50.0000 mg | ORAL_CAPSULE | Freq: Every morning | ORAL | 0 refills | Status: DC
  Filled 2023-10-28: qty 30, 30d supply, fill #0

## 2023-08-21 MED ORDER — AMPHETAMINE-DEXTROAMPHETAMINE 10 MG PO TABS
10.0000 mg | ORAL_TABLET | Freq: Every day | ORAL | 0 refills | Status: DC
  Filled 2023-10-14: qty 30, 30d supply, fill #0

## 2023-08-21 MED ORDER — LISDEXAMFETAMINE DIMESYLATE 50 MG PO CAPS
50.0000 mg | ORAL_CAPSULE | Freq: Every morning | ORAL | 0 refills | Status: DC
  Filled 2023-08-23: qty 30, 30d supply, fill #0

## 2023-08-21 MED ORDER — LISDEXAMFETAMINE DIMESYLATE 50 MG PO CAPS
50.0000 mg | ORAL_CAPSULE | Freq: Every morning | ORAL | 0 refills | Status: DC
  Filled 2023-09-30 (×2): qty 30, 30d supply, fill #0

## 2023-08-21 MED ORDER — AMPHETAMINE-DEXTROAMPHETAMINE 10 MG PO TABS: 10.0000 mg | ORAL_TABLET | Freq: Every day | ORAL | 0 refills | Status: DC

## 2023-08-21 MED ORDER — BUPROPION HCL ER (XL) 300 MG PO TB24
300.0000 mg | ORAL_TABLET | Freq: Every day | ORAL | 2 refills | Status: DC
  Filled 2023-09-27 – 2023-09-28 (×2): qty 30, 30d supply, fill #0
  Filled 2023-10-28: qty 30, 30d supply, fill #1
  Filled 2023-11-25: qty 30, 30d supply, fill #2

## 2023-08-23 ENCOUNTER — Other Ambulatory Visit: Payer: Self-pay

## 2023-08-23 ENCOUNTER — Other Ambulatory Visit (HOSPITAL_COMMUNITY): Payer: Self-pay

## 2023-09-02 ENCOUNTER — Other Ambulatory Visit (HOSPITAL_COMMUNITY): Payer: Self-pay

## 2023-09-13 ENCOUNTER — Other Ambulatory Visit: Payer: Self-pay | Admitting: Family

## 2023-09-13 ENCOUNTER — Other Ambulatory Visit: Payer: Self-pay

## 2023-09-13 DIAGNOSIS — M9902 Segmental and somatic dysfunction of thoracic region: Secondary | ICD-10-CM | POA: Diagnosis not present

## 2023-09-13 DIAGNOSIS — M9901 Segmental and somatic dysfunction of cervical region: Secondary | ICD-10-CM | POA: Diagnosis not present

## 2023-09-13 DIAGNOSIS — M9904 Segmental and somatic dysfunction of sacral region: Secondary | ICD-10-CM | POA: Diagnosis not present

## 2023-09-13 DIAGNOSIS — M9908 Segmental and somatic dysfunction of rib cage: Secondary | ICD-10-CM | POA: Diagnosis not present

## 2023-09-13 DIAGNOSIS — M9907 Segmental and somatic dysfunction of upper extremity: Secondary | ICD-10-CM | POA: Diagnosis not present

## 2023-09-13 DIAGNOSIS — D509 Iron deficiency anemia, unspecified: Secondary | ICD-10-CM

## 2023-09-13 DIAGNOSIS — M62838 Other muscle spasm: Secondary | ICD-10-CM | POA: Diagnosis not present

## 2023-09-13 DIAGNOSIS — M9900 Segmental and somatic dysfunction of head region: Secondary | ICD-10-CM | POA: Diagnosis not present

## 2023-09-13 DIAGNOSIS — M9905 Segmental and somatic dysfunction of pelvic region: Secondary | ICD-10-CM | POA: Diagnosis not present

## 2023-09-13 DIAGNOSIS — M9903 Segmental and somatic dysfunction of lumbar region: Secondary | ICD-10-CM | POA: Diagnosis not present

## 2023-09-13 DIAGNOSIS — M9906 Segmental and somatic dysfunction of lower extremity: Secondary | ICD-10-CM | POA: Diagnosis not present

## 2023-09-16 ENCOUNTER — Inpatient Hospital Stay (HOSPITAL_BASED_OUTPATIENT_CLINIC_OR_DEPARTMENT_OTHER): Payer: 59 | Admitting: Family

## 2023-09-16 ENCOUNTER — Inpatient Hospital Stay: Payer: 59 | Attending: Hematology & Oncology

## 2023-09-16 VITALS — BP 99/63 | HR 69 | Temp 97.8°F | Resp 17 | Ht 68.5 in | Wt 162.0 lb

## 2023-09-16 DIAGNOSIS — E611 Iron deficiency: Secondary | ICD-10-CM | POA: Diagnosis not present

## 2023-09-16 DIAGNOSIS — D509 Iron deficiency anemia, unspecified: Secondary | ICD-10-CM | POA: Diagnosis not present

## 2023-09-16 DIAGNOSIS — E538 Deficiency of other specified B group vitamins: Secondary | ICD-10-CM

## 2023-09-16 LAB — CBC WITH DIFFERENTIAL (CANCER CENTER ONLY)
Abs Immature Granulocytes: 0.01 10*3/uL (ref 0.00–0.07)
Basophils Absolute: 0 10*3/uL (ref 0.0–0.1)
Basophils Relative: 1 %
Eosinophils Absolute: 0 10*3/uL (ref 0.0–0.5)
Eosinophils Relative: 1 %
HCT: 39.3 % (ref 36.0–46.0)
Hemoglobin: 13.5 g/dL (ref 12.0–15.0)
Immature Granulocytes: 0 %
Lymphocytes Relative: 48 %
Lymphs Abs: 2 10*3/uL (ref 0.7–4.0)
MCH: 30.5 pg (ref 26.0–34.0)
MCHC: 34.4 g/dL (ref 30.0–36.0)
MCV: 88.9 fL (ref 80.0–100.0)
Monocytes Absolute: 0.3 10*3/uL (ref 0.1–1.0)
Monocytes Relative: 7 %
Neutro Abs: 1.8 10*3/uL (ref 1.7–7.7)
Neutrophils Relative %: 43 %
Platelet Count: 254 10*3/uL (ref 150–400)
RBC: 4.42 MIL/uL (ref 3.87–5.11)
RDW: 12.6 % (ref 11.5–15.5)
WBC Count: 4.2 10*3/uL (ref 4.0–10.5)
nRBC: 0 % (ref 0.0–0.2)

## 2023-09-16 LAB — RETICULOCYTES
Immature Retic Fract: 7.4 % (ref 2.3–15.9)
RBC.: 4.36 MIL/uL (ref 3.87–5.11)
Retic Count, Absolute: 73.2 10*3/uL (ref 19.0–186.0)
Retic Ct Pct: 1.7 % (ref 0.4–3.1)

## 2023-09-16 LAB — IRON AND IRON BINDING CAPACITY (CC-WL,HP ONLY)
Iron: 195 ug/dL — ABNORMAL HIGH (ref 28–170)
Saturation Ratios: 69 % — ABNORMAL HIGH (ref 10.4–31.8)
TIBC: 284 ug/dL (ref 250–450)
UIBC: 89 ug/dL — ABNORMAL LOW (ref 148–442)

## 2023-09-16 LAB — FERRITIN: Ferritin: 151 ng/mL (ref 11–307)

## 2023-09-16 NOTE — Progress Notes (Signed)
 Hematology and Oncology Follow Up Visit  Amanda Gill [id number] 14-Apr-1996 27 y.o. 09/16/2023   Principle Diagnosis:  Iron  deficiency without anemia   Current Therapy:   IV iron  as indicated   Interim History:  Amanda Gill is here today for follow-up. She is still having some fatigue at times. She has also had a headache in the right side behind her eye for several days.   No vision changes or loss.  Her only complaint at this time is numbness and tingling sensation on her arms and legs that comes and goes.  She is interested in  neurology referral. I will check with her PCP for order.  Her cycle has been more regulated and flow normal with IUD placement.  No other blood loss noted. No bruising or petechiae.  POTS associated dizziness.  No fever, n/v, cough, rash, SOB, chest pain, palpitations, abdominal pain or changes in bowel or bladder habits at this time.  No falls or syncope reported.  No swelling noted in her extremities.  Appetite and hydration are good. Weight is stable at 167 lbs.   ECOG Performance Status: 1 - Symptomatic but completely ambulatory  Medications:  Allergies as of 09/16/2023       Reactions   Chlorhexidine Other (See Comments), Itching, Rash, Shortness Of Breath   With shower prep night before surgery With shower prep night before surgery With shower prep night before surgery   Iodine Shortness Of Breath   Patient reported flushed sensation and shortness of breath following iodine for CT imaging.  No hives or airway swelling.   Lactose Anaphylaxis, Diarrhea   Cortisone Other (See Comments)   Causes flare of bipolar symptoms. Avoid corticosteroids when possible, especially kenalog  Behavioral issues   Lactose Intolerance (gi) Other (See Comments)   throat swells/stomach hurts/constipation   Milk (cow) Other (See Comments)   Triamcinolone  Acetonide    Behavioral issues        Medication List        Accurate as of September 16, 2023 11:04  AM. If you have any questions, ask your nurse or doctor.          STOP taking these medications    celecoxib  100 MG capsule Commonly known as: CELEBREX  Stopped by: Kennard Pea   clobetasol  ointment 0.05 % Commonly known as: TEMOVATE  Stopped by: Kennard Pea   Efinaconazole  10 % Soln Stopped by: Kennard Pea   EPINEPHrine  0.3 mg/0.3 mL Soaj injection Commonly known as: EPI-PEN Stopped by: Kennard Pea   gabapentin  600 MG tablet Commonly known as: NEURONTIN  Stopped by: Kennard Pea   Methylphenidate  HCl 10 MG Chew Stopped by: Kennard Pea   mupirocin  ointment 2 % Commonly known as: BACTROBAN  Stopped by: Kennard Pea   Myrbetriq  50 MG Tb24 tablet Generic drug: mirabegron  ER Stopped by: Kennard Pea   NONFORMULARY OR COMPOUNDED ITEM Stopped by: Kennard Pea   oxyCODONE -acetaminophen  5-325 MG tablet Commonly known as: PERCOCET/ROXICET Stopped by: Kennard Pea   polyethylene glycol powder 17 GM/SCOOP powder Commonly known as: MiraLax  Stopped by: Kennard Pea   QuilliChew  ER 20 MG Cher chewable tablet Generic drug: methylphenidate  Stopped by: Kennard Pea   QuilliChew  ER 30 MG Cher chewable tablet Generic drug: Methylphenidate  HCl Stopped by: Kennard Pea   sildenafil  20 MG tablet Commonly known as: REVATIO  Stopped by: Kennard Pea   triamcinolone  cream 0.1 % Commonly known as: KENALOG  Stopped by: Kennard Pea       TAKE these medications    albuterol  108 (90 Base)  MCG/ACT inhaler Commonly known as: VENTOLIN  HFA Inhale 1 puff into the lungs every 6 (six) hours as needed.   amphetamine -dextroamphetamine  10 MG tablet Commonly known as: ADDERALL Take 1 tablet (10 mg total) by mouth daily in the afternoon.   amphetamine -dextroamphetamine  10 MG tablet Commonly known as: ADDERALL Take 1 tablet (10 mg total) by mouth daily in the afternoon. Start taking on: September 20, 2023   amphetamine -dextroamphetamine  10 MG tablet Commonly known as:  ADDERALL Take 1 tablet (10 mg total) by mouth daily in the afternoon. Start taking on: Oct 19, 2023   atenolol  25 MG tablet Commonly known as: TENORMIN  Take 1 tablet (25 mg total) by mouth daily. What changed: Another medication with the same name was removed. Continue taking this medication, and follow the directions you see here. Changed by: Kennard Pea   buPROPion  300 MG 24 hr tablet Commonly known as: WELLBUTRIN  XL Take 1 tablet (300 mg total) by mouth daily. What changed: Another medication with the same name was removed. Continue taking this medication, and follow the directions you see here. Changed by: Kennard Pea   Caplyta  42 MG capsule Generic drug: lumateperone  tosylate Take 1 capsule (42 mg total) by mouth daily. What changed: Another medication with the same name was removed. Continue taking this medication, and follow the directions you see here. Changed by: Kennard Pea   Caplyta  42 MG capsule Generic drug: lumateperone  tosylate Take 1 capsule (42 mg total) by mouth daily. What changed: Another medication with the same name was removed. Continue taking this medication, and follow the directions you see here. Changed by: Kennard Pea   doxycycline  100 MG capsule Commonly known as: VIBRAMYCIN  Take 1 twice a day with food for 7 days   ibuprofen  200 MG tablet Commonly known as: ADVIL  Take 400 mg by mouth every 6 (six) hours as needed. What changed: Another medication with the same name was removed. Continue taking this medication, and follow the directions you see here. Changed by: Kennard Pea   imiquimod  5 % cream Commonly known as: Aldara  Apply topically at bedtime.   levonorgestrel  20 MCG/DAY Iud Commonly known as: MIRENA  1 each by Intrauterine route once. What changed: Another medication with the same name was removed. Continue taking this medication, and follow the directions you see here. Changed by: Kennard Pea   lisdexamfetamine 50 MG  capsule Commonly known as: Vyvanse  Take 1 capsule (50 mg total) by mouth in the morning.   lisdexamfetamine 50 MG capsule Commonly known as: Vyvanse  Take 1 capsule (50 mg total) by mouth in the morning. Start taking on: September 20, 2023   lisdexamfetamine 50 MG capsule Commonly known as: Vyvanse  Take 1 capsule (50 mg total) by mouth in the morning. Start taking on: Oct 19, 2023   omeprazole  40 MG capsule Commonly known as: PRILOSEC Take 1 capsule (40 mg total) by mouth in the morning.   optichamber diamond Misc Use spacer as directed with inhaler What changed: Another medication with the same name was removed. Continue taking this medication, and follow the directions you see here. Changed by: Kennard Pea   Qbrexza  2.4 % Pads Generic drug: Glycopyrronium Tosylate  Apply to underarms every night   Safety Seal Miscellaneous Misc Apply 1 application  topically in the morning. Hormonic Hair Solution        Allergies:  Allergies  Allergen Reactions   Chlorhexidine Other (See Comments), Itching, Rash and Shortness Of Breath    With shower prep night before surgery With shower prep  night before surgery With shower prep night before surgery    Iodine Shortness Of Breath    Patient reported flushed sensation and shortness of breath following iodine for CT imaging.  No hives or airway swelling.   Lactose Anaphylaxis and Diarrhea   Cortisone Other (See Comments)    Causes flare of bipolar symptoms. Avoid corticosteroids when possible, especially kenalog   Behavioral issues   Lactose Intolerance (Gi) Other (See Comments)    throat swells/stomach hurts/constipation   Milk (Cow) Other (See Comments)   Triamcinolone  Acetonide     Behavioral issues    Past Medical History, Surgical history, Social history, and Family History were reviewed and updated.  Review of Systems: All other 10 point review of systems is negative.   Physical Exam:  height is 5' 8.5" (1.74 m) and  weight is 162 lb (73.5 kg). Her oral temperature is 97.8 F (36.6 C). Her blood pressure is 99/63 and her pulse is 69. Her respiration is 17 and oxygen saturation is 100%.   Wt Readings from Last 3 Encounters:  09/16/23 162 lb (73.5 kg)  07/17/23 174 lb 12.8 oz (79.3 kg)  06/27/23 174 lb 8 oz (79.2 kg)    Ocular: Sclerae unicteric, pupils equal, round and reactive to light Ear-nose-throat: Oropharynx clear, dentition fair Lymphatic: No cervical or supraclavicular adenopathy Lungs no rales or rhonchi, good excursion bilaterally Heart regular rate and rhythm, no murmur appreciated Abd soft, nontender, positive bowel sounds MSK no focal spinal tenderness, no joint edema Neuro: non-focal, well-oriented, appropriate affect Breasts: Deferred   Lab Results  Component Value Date   WBC 4.2 09/16/2023   HGB 13.5 09/16/2023   HCT 39.3 09/16/2023   MCV 88.9 09/16/2023   PLT 254 09/16/2023   Lab Results  Component Value Date   FERRITIN 16 07/17/2023   IRON  52 07/17/2023   TIBC 357 07/17/2023   UIBC 305 07/17/2023   IRONPCTSAT 15 07/17/2023   Lab Results  Component Value Date   RETICCTPCT 1.7 09/16/2023   RBC 4.36 09/16/2023   RBC 4.42 09/16/2023   No results found for: "KPAFRELGTCHN", "LAMBDASER", "KAPLAMBRATIO" No results found for: "IGGSERUM", "IGA", "IGMSERUM" No results found for: "TOTALPROTELP", "ALBUMINELP", "A1GS", "A2GS", "BETS", "BETA2SER", "GAMS", "MSPIKE", "SPEI"   Chemistry      Component Value Date/Time   NA 142 07/17/2023 0854   K 4.1 07/17/2023 0854   CL 109 07/17/2023 0854   CO2 27 07/17/2023 0854   BUN 11 07/17/2023 0854   CREATININE 0.81 07/17/2023 0854      Component Value Date/Time   CALCIUM 9.7 07/17/2023 0854   ALKPHOS 46 07/17/2023 0854   AST 12 (L) 07/17/2023 0854   ALT 7 07/17/2023 0854   BILITOT 1.1 07/17/2023 0854       Impression and Plan: Amanda Gill is a pleasant 28 yo female with history of iron  deficiency without anemia.  Iron   studies are pending. We will replace if needed to maintain ferritin > 50.  Follow-up in 3 months.   Kennard Pea, NP 4/21/202511:04 AM

## 2023-09-17 ENCOUNTER — Encounter: Payer: Self-pay | Admitting: *Deleted

## 2023-09-19 ENCOUNTER — Ambulatory Visit: Payer: 59 | Admitting: Dermatology

## 2023-09-19 DIAGNOSIS — L810 Postinflammatory hyperpigmentation: Secondary | ICD-10-CM | POA: Diagnosis not present

## 2023-09-19 DIAGNOSIS — L910 Hypertrophic scar: Secondary | ICD-10-CM

## 2023-09-19 NOTE — Progress Notes (Signed)
   Follow-Up Visit   Subjective  Amanda Gill is a 28 y.o. female who presents for the following: scarring at biopsy site  Patient present today for follow up visit for biopsy site. Patient was last evaluated on 06/19/2023. Patient reports sxs are better. Patient denies medication changes. Patient stated that she had to go Urgent Care because the biopsy site on chest wouldn't stop bleeding   The following portions of the chart were reviewed this encounter and updated as appropriate: medications, allergies, medical history  Review of Systems:  No other skin or systemic complaints except as noted in HPI or Assessment and Plan.  Objective  Well appearing patient in no apparent distress; mood and affect are within normal limits.   A focused examination was performed of the following areas: Chest and Index Finger  Relevant exam findings are noted in the Assessment and Plan.    Assessment & Plan   Wound Care and Hypertrophic Scar  Exam: Well healed scar on biopsy site on the chest  - Assessment: Biopsy of a moderately atypical mole on the breast area in late January. Bleeding required urgent care intervention with silver nitrate on January 22nd. Wound healed slowly with patient using Aquaphor and Vaseline daily. Scabbed over by February 19th, scab fell off in March. PCP confirmed no signs of infection. On examination today, area appears well-healed with some slight pigmentation and hypertrophy of scarring , assessed as natural hyperpigmentation.  - Plan:     Monitor the biopsy site every 6 months due to bx diagnosis of moderately atypical mole -Sample of StratDerm and info for purchasing more when needed.  Pt to massage into areas for 10 min daily    Schedule next follow-up appointment in 6 months    Return in about 6 months (around 03/20/2024) for F/u for biopsy site .  Exie Holler, CMA, am acting as scribe for [employer], DO.   Documentation: I have reviewed the  above documentation for accuracy and completeness, and I agree with the above.  Louana Roup, DO

## 2023-09-19 NOTE — Patient Instructions (Addendum)
 Hello Amanda Gill,  Thank you for visiting today. Here is a summary of the key instructions:  - Wound Care: Continue to monitor the breast area scar for any changes  - Medications: Apply prescription-strength StrataDerm  silicone cream sparingly to the finger scar   - You can purchase more silicone cream online using the provided QR code  - Home Care: Gently massage the finger scar to reduce tenderness  - Follow-up: Return for a follow-up appointment in 6 months  Please reach out if you have any questions or concerns.  Warm regards,  Dr. Louana Roup, Dermatology     Important Information  Due to recent changes in healthcare laws, you may see results of your pathology and/or laboratory studies on MyChart before the doctors have had a chance to review them. We understand that in some cases there may be results that are confusing or concerning to you. Please understand that not all results are received at the same time and often the doctors may need to interpret multiple results in order to provide you with the best plan of care or course of treatment. Therefore, we ask that you please give us  2 business days to thoroughly review all your results before contacting the office for clarification. Should we see a critical lab result, you will be contacted sooner.   If You Need Anything After Your Visit  If you have any questions or concerns for your doctor, please call our main line at [phone] If no one answers, please leave a voicemail as directed and we will return your call as soon as possible. Messages left after 4 pm will be answered the following business day.   You may also send us  a message via MyChart. We typically respond to MyChart messages within 1-2 business days.  For prescription refills, please ask your pharmacy to contact our office. Our fax number is [phone].  If you have an urgent issue when the clinic is closed that cannot wait until the next business day, you  can page your doctor at the number below.    Please note that while we do our best to be available for urgent issues outside of office hours, we are not available 24/7.   If you have an urgent issue and are unable to reach us , you may choose to seek medical care at your doctor's office, retail clinic, urgent care center, or emergency room.  If you have a medical emergency, please immediately call 911 or go to the emergency department. In the event of inclement weather, please call our main line at [phone] for an update on the status of any delays or closures.  Dermatology Medication Tips: Please keep the boxes that topical medications come in in order to help keep track of the instructions about where and how to use these. Pharmacies typically print the medication instructions only on the boxes and not directly on the medication tubes.   If your medication is too expensive, please contact our office at [phone] or send us  a message through MyChart.   We are unable to tell what your co-pay for medications will be in advance as this is different depending on your insurance coverage. However, we may be able to find a substitute medication at lower cost or fill out paperwork to get insurance to cover a needed medication.   If a prior authorization is required to get your medication covered by your insurance company, please allow us  1-2 business days to complete this process.  Drug prices  often vary depending on where the prescription is filled and some pharmacies may offer cheaper prices.  The website www.goodrx.com contains coupons for medications through different pharmacies. The prices here do not account for what the cost may be with help from insurance (it may be cheaper with your insurance), but the website can give you the price if you did not use any insurance.  - You can print the associated coupon and take it with your prescription to the pharmacy.  - You may also stop by our  office during regular business hours and pick up a GoodRx coupon card.  - If you need your prescription sent electronically to a different pharmacy, notify our office through [facility] or by phone at [phone]

## 2023-09-27 ENCOUNTER — Other Ambulatory Visit (HOSPITAL_COMMUNITY): Payer: Self-pay

## 2023-09-27 ENCOUNTER — Other Ambulatory Visit: Payer: Self-pay

## 2023-09-28 ENCOUNTER — Other Ambulatory Visit: Payer: Self-pay

## 2023-09-30 ENCOUNTER — Other Ambulatory Visit (HOSPITAL_COMMUNITY): Payer: Self-pay

## 2023-09-30 ENCOUNTER — Other Ambulatory Visit (HOSPITAL_BASED_OUTPATIENT_CLINIC_OR_DEPARTMENT_OTHER): Payer: Self-pay

## 2023-10-07 DIAGNOSIS — R29818 Other symptoms and signs involving the nervous system: Secondary | ICD-10-CM | POA: Diagnosis not present

## 2023-10-07 DIAGNOSIS — R200 Anesthesia of skin: Secondary | ICD-10-CM | POA: Diagnosis not present

## 2023-10-08 DIAGNOSIS — N3946 Mixed incontinence: Secondary | ICD-10-CM | POA: Diagnosis not present

## 2023-10-08 DIAGNOSIS — R350 Frequency of micturition: Secondary | ICD-10-CM | POA: Diagnosis not present

## 2023-10-09 DIAGNOSIS — R200 Anesthesia of skin: Secondary | ICD-10-CM | POA: Diagnosis not present

## 2023-10-09 DIAGNOSIS — R519 Headache, unspecified: Secondary | ICD-10-CM | POA: Diagnosis not present

## 2023-10-09 DIAGNOSIS — R208 Other disturbances of skin sensation: Secondary | ICD-10-CM | POA: Diagnosis not present

## 2023-10-10 DIAGNOSIS — R200 Anesthesia of skin: Secondary | ICD-10-CM | POA: Diagnosis not present

## 2023-10-10 DIAGNOSIS — R208 Other disturbances of skin sensation: Secondary | ICD-10-CM | POA: Diagnosis not present

## 2023-10-14 ENCOUNTER — Other Ambulatory Visit (HOSPITAL_COMMUNITY): Payer: Self-pay

## 2023-10-28 ENCOUNTER — Other Ambulatory Visit (HOSPITAL_COMMUNITY): Payer: Self-pay

## 2023-11-04 DIAGNOSIS — K2950 Unspecified chronic gastritis without bleeding: Secondary | ICD-10-CM | POA: Diagnosis not present

## 2023-11-04 DIAGNOSIS — R1310 Dysphagia, unspecified: Secondary | ICD-10-CM | POA: Diagnosis not present

## 2023-11-04 DIAGNOSIS — K219 Gastro-esophageal reflux disease without esophagitis: Secondary | ICD-10-CM | POA: Diagnosis not present

## 2023-11-05 DIAGNOSIS — F840 Autistic disorder: Secondary | ICD-10-CM | POA: Diagnosis not present

## 2023-11-05 DIAGNOSIS — F429 Obsessive-compulsive disorder, unspecified: Secondary | ICD-10-CM | POA: Diagnosis not present

## 2023-11-05 DIAGNOSIS — F4312 Post-traumatic stress disorder, chronic: Secondary | ICD-10-CM | POA: Diagnosis not present

## 2023-11-05 DIAGNOSIS — F902 Attention-deficit hyperactivity disorder, combined type: Secondary | ICD-10-CM | POA: Diagnosis not present

## 2023-11-05 DIAGNOSIS — F3175 Bipolar disorder, in partial remission, most recent episode depressed: Secondary | ICD-10-CM | POA: Diagnosis not present

## 2023-11-06 DIAGNOSIS — K219 Gastro-esophageal reflux disease without esophagitis: Secondary | ICD-10-CM | POA: Diagnosis not present

## 2023-11-12 DIAGNOSIS — F429 Obsessive-compulsive disorder, unspecified: Secondary | ICD-10-CM | POA: Diagnosis not present

## 2023-11-12 DIAGNOSIS — F3175 Bipolar disorder, in partial remission, most recent episode depressed: Secondary | ICD-10-CM | POA: Diagnosis not present

## 2023-11-12 DIAGNOSIS — F902 Attention-deficit hyperactivity disorder, combined type: Secondary | ICD-10-CM | POA: Diagnosis not present

## 2023-11-12 DIAGNOSIS — F4312 Post-traumatic stress disorder, chronic: Secondary | ICD-10-CM | POA: Diagnosis not present

## 2023-11-12 DIAGNOSIS — F840 Autistic disorder: Secondary | ICD-10-CM | POA: Diagnosis not present

## 2023-11-13 ENCOUNTER — Other Ambulatory Visit (HOSPITAL_COMMUNITY): Payer: Self-pay

## 2023-11-13 DIAGNOSIS — F319 Bipolar disorder, unspecified: Secondary | ICD-10-CM | POA: Diagnosis not present

## 2023-11-13 DIAGNOSIS — F902 Attention-deficit hyperactivity disorder, combined type: Secondary | ICD-10-CM | POA: Diagnosis not present

## 2023-11-13 DIAGNOSIS — F4310 Post-traumatic stress disorder, unspecified: Secondary | ICD-10-CM | POA: Diagnosis not present

## 2023-11-13 MED ORDER — AMPHETAMINE-DEXTROAMPHET ER 30 MG PO CP24
60.0000 mg | ORAL_CAPSULE | Freq: Every morning | ORAL | 0 refills | Status: DC
  Filled 2023-11-13: qty 60, 30d supply, fill #0

## 2023-11-13 MED ORDER — BUPROPION HCL ER (XL) 300 MG PO TB24
300.0000 mg | ORAL_TABLET | Freq: Every day | ORAL | 2 refills | Status: DC
  Filled 2023-12-23 – 2023-12-24 (×2): qty 30, 30d supply, fill #0
  Filled 2024-01-20: qty 30, 30d supply, fill #1
  Filled 2024-02-17 – 2024-02-18 (×2): qty 30, 30d supply, fill #2

## 2023-11-13 MED ORDER — CAPLYTA 42 MG PO CAPS
42.0000 mg | ORAL_CAPSULE | Freq: Every day | ORAL | 2 refills | Status: DC
  Filled 2024-01-06: qty 30, 30d supply, fill #0
  Filled 2024-02-03: qty 30, 30d supply, fill #1
  Filled 2024-03-01 – 2024-05-04 (×3): qty 30, 30d supply, fill #2

## 2023-11-14 ENCOUNTER — Other Ambulatory Visit (HOSPITAL_COMMUNITY): Payer: Self-pay

## 2023-11-19 DIAGNOSIS — F3175 Bipolar disorder, in partial remission, most recent episode depressed: Secondary | ICD-10-CM | POA: Diagnosis not present

## 2023-11-19 DIAGNOSIS — F4312 Post-traumatic stress disorder, chronic: Secondary | ICD-10-CM | POA: Diagnosis not present

## 2023-11-19 DIAGNOSIS — F429 Obsessive-compulsive disorder, unspecified: Secondary | ICD-10-CM | POA: Diagnosis not present

## 2023-11-19 DIAGNOSIS — F902 Attention-deficit hyperactivity disorder, combined type: Secondary | ICD-10-CM | POA: Diagnosis not present

## 2023-11-19 DIAGNOSIS — F840 Autistic disorder: Secondary | ICD-10-CM | POA: Diagnosis not present

## 2023-11-26 DIAGNOSIS — F840 Autistic disorder: Secondary | ICD-10-CM | POA: Diagnosis not present

## 2023-11-26 DIAGNOSIS — F4312 Post-traumatic stress disorder, chronic: Secondary | ICD-10-CM | POA: Diagnosis not present

## 2023-11-26 DIAGNOSIS — F429 Obsessive-compulsive disorder, unspecified: Secondary | ICD-10-CM | POA: Diagnosis not present

## 2023-11-26 DIAGNOSIS — F3175 Bipolar disorder, in partial remission, most recent episode depressed: Secondary | ICD-10-CM | POA: Diagnosis not present

## 2023-11-26 DIAGNOSIS — F902 Attention-deficit hyperactivity disorder, combined type: Secondary | ICD-10-CM | POA: Diagnosis not present

## 2023-12-02 ENCOUNTER — Other Ambulatory Visit (HOSPITAL_COMMUNITY): Payer: Self-pay

## 2023-12-02 DIAGNOSIS — F319 Bipolar disorder, unspecified: Secondary | ICD-10-CM | POA: Diagnosis not present

## 2023-12-02 DIAGNOSIS — F411 Generalized anxiety disorder: Secondary | ICD-10-CM | POA: Diagnosis not present

## 2023-12-02 DIAGNOSIS — F902 Attention-deficit hyperactivity disorder, combined type: Secondary | ICD-10-CM | POA: Diagnosis not present

## 2023-12-02 MED ORDER — AMPHETAMINE-DEXTROAMPHET ER 30 MG PO CP24
60.0000 mg | ORAL_CAPSULE | Freq: Every morning | ORAL | 0 refills | Status: DC
  Filled 2024-01-21: qty 60, 30d supply, fill #0

## 2023-12-02 MED ORDER — AMPHETAMINE-DEXTROAMPHET ER 30 MG PO CP24: 60.0000 mg | ORAL_CAPSULE | Freq: Every morning | ORAL | 0 refills | Status: DC

## 2023-12-02 MED ORDER — CAPLYTA 42 MG PO CAPS
42.0000 mg | ORAL_CAPSULE | Freq: Every day | ORAL | 2 refills | Status: DC
  Filled 2023-12-02: qty 30, 30d supply, fill #0

## 2023-12-02 MED ORDER — BUPROPION HCL ER (XL) 300 MG PO TB24
300.0000 mg | ORAL_TABLET | Freq: Every day | ORAL | 2 refills | Status: DC
  Filled 2023-12-02 – 2024-03-20 (×3): qty 30, 30d supply, fill #0
  Filled 2024-04-14: qty 30, 30d supply, fill #1

## 2023-12-04 DIAGNOSIS — H04123 Dry eye syndrome of bilateral lacrimal glands: Secondary | ICD-10-CM | POA: Diagnosis not present

## 2023-12-04 DIAGNOSIS — H5340 Unspecified visual field defects: Secondary | ICD-10-CM | POA: Diagnosis not present

## 2023-12-04 DIAGNOSIS — H5213 Myopia, bilateral: Secondary | ICD-10-CM | POA: Diagnosis not present

## 2023-12-04 DIAGNOSIS — Z135 Encounter for screening for eye and ear disorders: Secondary | ICD-10-CM | POA: Diagnosis not present

## 2023-12-10 DIAGNOSIS — F4312 Post-traumatic stress disorder, chronic: Secondary | ICD-10-CM | POA: Diagnosis not present

## 2023-12-10 DIAGNOSIS — F840 Autistic disorder: Secondary | ICD-10-CM | POA: Diagnosis not present

## 2023-12-10 DIAGNOSIS — F902 Attention-deficit hyperactivity disorder, combined type: Secondary | ICD-10-CM | POA: Diagnosis not present

## 2023-12-10 DIAGNOSIS — F3175 Bipolar disorder, in partial remission, most recent episode depressed: Secondary | ICD-10-CM | POA: Diagnosis not present

## 2023-12-10 DIAGNOSIS — F429 Obsessive-compulsive disorder, unspecified: Secondary | ICD-10-CM | POA: Diagnosis not present

## 2023-12-16 DIAGNOSIS — M357 Hypermobility syndrome: Secondary | ICD-10-CM | POA: Diagnosis not present

## 2023-12-16 DIAGNOSIS — F319 Bipolar disorder, unspecified: Secondary | ICD-10-CM | POA: Diagnosis not present

## 2023-12-16 DIAGNOSIS — I7300 Raynaud's syndrome without gangrene: Secondary | ICD-10-CM | POA: Diagnosis not present

## 2023-12-16 DIAGNOSIS — M2550 Pain in unspecified joint: Secondary | ICD-10-CM | POA: Diagnosis not present

## 2023-12-16 DIAGNOSIS — G90A Postural orthostatic tachycardia syndrome (POTS): Secondary | ICD-10-CM | POA: Diagnosis not present

## 2023-12-17 DIAGNOSIS — F429 Obsessive-compulsive disorder, unspecified: Secondary | ICD-10-CM | POA: Diagnosis not present

## 2023-12-17 DIAGNOSIS — F4312 Post-traumatic stress disorder, chronic: Secondary | ICD-10-CM | POA: Diagnosis not present

## 2023-12-17 DIAGNOSIS — F3175 Bipolar disorder, in partial remission, most recent episode depressed: Secondary | ICD-10-CM | POA: Diagnosis not present

## 2023-12-17 DIAGNOSIS — F840 Autistic disorder: Secondary | ICD-10-CM | POA: Diagnosis not present

## 2023-12-17 DIAGNOSIS — F902 Attention-deficit hyperactivity disorder, combined type: Secondary | ICD-10-CM | POA: Diagnosis not present

## 2023-12-17 DIAGNOSIS — M357 Hypermobility syndrome: Secondary | ICD-10-CM | POA: Diagnosis not present

## 2023-12-18 ENCOUNTER — Inpatient Hospital Stay (HOSPITAL_BASED_OUTPATIENT_CLINIC_OR_DEPARTMENT_OTHER): Admitting: Family

## 2023-12-18 ENCOUNTER — Encounter: Payer: Self-pay | Admitting: Family

## 2023-12-18 ENCOUNTER — Inpatient Hospital Stay: Attending: Hematology & Oncology

## 2023-12-18 VITALS — BP 111/70 | HR 95 | Resp 17 | Wt 161.0 lb

## 2023-12-18 DIAGNOSIS — E538 Deficiency of other specified B group vitamins: Secondary | ICD-10-CM

## 2023-12-18 DIAGNOSIS — E611 Iron deficiency: Secondary | ICD-10-CM | POA: Insufficient documentation

## 2023-12-18 DIAGNOSIS — D509 Iron deficiency anemia, unspecified: Secondary | ICD-10-CM

## 2023-12-18 LAB — CBC WITH DIFFERENTIAL (CANCER CENTER ONLY)
Abs Immature Granulocytes: 0.03 K/uL (ref 0.00–0.07)
Basophils Absolute: 0 K/uL (ref 0.0–0.1)
Basophils Relative: 1 %
Eosinophils Absolute: 0.1 K/uL (ref 0.0–0.5)
Eosinophils Relative: 1 %
HCT: 37.6 % (ref 36.0–46.0)
Hemoglobin: 12.8 g/dL (ref 12.0–15.0)
Immature Granulocytes: 1 %
Lymphocytes Relative: 42 %
Lymphs Abs: 1.7 K/uL (ref 0.7–4.0)
MCH: 30 pg (ref 26.0–34.0)
MCHC: 34 g/dL (ref 30.0–36.0)
MCV: 88.1 fL (ref 80.0–100.0)
Monocytes Absolute: 0.3 K/uL (ref 0.1–1.0)
Monocytes Relative: 8 %
Neutro Abs: 2 K/uL (ref 1.7–7.7)
Neutrophils Relative %: 47 %
Platelet Count: 254 K/uL (ref 150–400)
RBC: 4.27 MIL/uL (ref 3.87–5.11)
RDW: 12.6 % (ref 11.5–15.5)
WBC Count: 4.2 K/uL (ref 4.0–10.5)
nRBC: 0 % (ref 0.0–0.2)

## 2023-12-18 LAB — IRON AND IRON BINDING CAPACITY (CC-WL,HP ONLY)
Iron: 148 ug/dL (ref 28–170)
Saturation Ratios: 46 % — ABNORMAL HIGH (ref 10.4–31.8)
TIBC: 319 ug/dL (ref 250–450)
UIBC: 171 ug/dL

## 2023-12-18 LAB — RETICULOCYTES
Immature Retic Fract: 5.4 % (ref 2.3–15.9)
RBC.: 4.25 MIL/uL (ref 3.87–5.11)
Retic Count, Absolute: 70.6 K/uL (ref 19.0–186.0)
Retic Ct Pct: 1.7 % (ref 0.4–3.1)

## 2023-12-18 LAB — FOLATE: Folate: 24.5 ng/mL (ref >5.9)

## 2023-12-18 LAB — VITAMIN B12: Vitamin B-12: 609 pg/mL (ref 180–914)

## 2023-12-18 LAB — FERRITIN: Ferritin: 132 ng/mL (ref 11–307)

## 2023-12-18 NOTE — Progress Notes (Signed)
 Hematology and Oncology Follow Up Visit  Amanda Gill [id number] [birth year] 27 y.o. 12/18/2023   Principle Diagnosis:  Iron  deficiency without anemia    Current Therapy:        IV iron  as indicated  Interim History:  [name] is here today for follow-up. She has noted SOB with exertion and dry skin, eyes and mouth over the last 3 weeks.  She states that she has seen her orthodontist and they did swab some areas in her mouth suspicious for possible bacterial infections. The results are pending.  No  She has some dizziness with POTS.  No fever, chills, n/v, cough, chest pain, pain, palpitations, abdominal pain or changes in bowel or bladder habits.  Cycle is regular with more normal flow with IUD. No other blood loss noted. No abnormal bruising/hematomas or petechiae.  No swelling in her extremities at this time.  She still has numbness and tingling in her arms and legs which she feels is stable.  No falls or syncope reported.  Appetite and hydration are good per patient. She states that she has lost 13 lbs since January without trying.   ECOG Performance Status: 1 - Symptomatic but completely ambulatory  Medications:  Allergies as of 12/18/2023       Reactions   Chlorhexidine Other (See Comments), Itching, Rash, Shortness Of Breath   With shower prep night before surgery With shower prep night before surgery With shower prep night before surgery   Iodine Shortness Of Breath   Patient reported flushed sensation and shortness of breath following iodine for CT imaging.  No hives or airway swelling.   Lactose Anaphylaxis, Diarrhea   Cortisone Other (See Comments)   Causes flare of bipolar symptoms. Avoid corticosteroids when possible, especially kenalog  Behavioral issues   Lactose Intolerance (gi) Other (See Comments)   throat swells/stomach hurts/constipation   Milk (cow) Other (See Comments)   Triamcinolone  Acetonide    Behavioral issues        Medication List         Accurate as of December 18, 2023  1:00 PM. If you have any questions, ask your nurse or doctor.          albuterol  108 (90 Base) MCG/ACT inhaler Commonly known as: VENTOLIN  HFA Inhale 1 puff into the lungs every 6 (six) hours as needed.   amphetamine -dextroamphetamine  10 MG tablet Commonly known as: ADDERALL Take 1 tablet (10 mg total) by mouth daily in the afternoon.   amphetamine -dextroamphetamine  10 MG tablet Commonly known as: ADDERALL Take 1 tablet (10 mg total) by mouth daily in the afternoon.   amphetamine -dextroamphetamine  10 MG tablet Commonly known as: ADDERALL Take 1 tablet (10 mg total) by mouth daily in the afternoon.   amphetamine -dextroamphetamine  30 MG 24 hr capsule Commonly known as: ADDERALL XR Take 2 capsules (60 mg total) by mouth every morning.   amphetamine -dextroamphetamine  30 MG 24 hr capsule Commonly known as: ADDERALL XR Take 2 capsules (60 mg total) by mouth in the morning.   amphetamine -dextroamphetamine  30 MG 24 hr capsule Commonly known as: ADDERALL XR Take 2 capsules (60 mg total) by mouth in the morning. Start taking on: January 08, 2024   amphetamine -dextroamphetamine  30 MG 24 hr capsule Commonly known as: ADDERALL XR Take 2 capsules (60 mg total) by mouth in the morning. Start taking on: February 05, 2024   atenolol  25 MG tablet Commonly known as: TENORMIN  Take 1 tablet (25 mg total) by mouth daily.   buPROPion  300 MG 24 hr  tablet Commonly known as: WELLBUTRIN  XL Take 1 tablet (300 mg total) by mouth daily.   buPROPion  300 MG 24 hr tablet Commonly known as: WELLBUTRIN  XL Take 1 tablet (300 mg total) by mouth daily.   buPROPion  300 MG 24 hr tablet Commonly known as: WELLBUTRIN  XL Take 1 tablet (300 mg total) by mouth daily.   Caplyta  42 MG capsule Generic drug: lumateperone  tosylate Take 1 capsule (42 mg total) by mouth daily.   Caplyta  42 MG capsule Generic drug: lumateperone  tosylate Take 1 capsule (42 mg total) by  mouth daily.   Caplyta  42 MG capsule Generic drug: lumateperone  tosylate Take 1 capsule (42 mg total) by mouth daily.   Caplyta  42 MG capsule Generic drug: lumateperone  tosylate Take 1 capsule (42 mg total) by mouth daily.   doxycycline  100 MG capsule Commonly known as: VIBRAMYCIN  Take 1 twice a day with food for 7 days   ibuprofen  200 MG tablet Commonly known as: ADVIL  Take 400 mg by mouth every 6 (six) hours as needed.   imiquimod  5 % cream Commonly known as: Aldara  Apply topically at bedtime.   levonorgestrel  20 MCG/DAY Iud Commonly known as: MIRENA  1 each by Intrauterine route once.   lisdexamfetamine 50 MG capsule Commonly known as: Vyvanse  Take 1 capsule (50 mg total) by mouth in the morning.   lisdexamfetamine 50 MG capsule Commonly known as: Vyvanse  Take 1 capsule (50 mg total) by mouth in the morning.   lisdexamfetamine 50 MG capsule Commonly known as: Vyvanse  Take 1 capsule (50 mg total) by mouth in the morning.   omeprazole  40 MG capsule Commonly known as: PRILOSEC Take 1 capsule (40 mg total) by mouth in the morning.   optichamber diamond Misc Use spacer as directed with inhaler   Qbrexza  2.4 % Pads Generic drug: Glycopyrronium Tosylate  Apply to underarms every night   Safety Seal Miscellaneous Misc Apply 1 application  topically in the morning. Hormonic Hair Solution        Allergies:  Allergies  Allergen Reactions   Chlorhexidine Other (See Comments), Itching, Rash and Shortness Of Breath    With shower prep night before surgery With shower prep night before surgery With shower prep night before surgery    Iodine Shortness Of Breath    Patient reported flushed sensation and shortness of breath following iodine for CT imaging.  No hives or airway swelling.   Lactose Anaphylaxis and Diarrhea   Cortisone Other (See Comments)    Causes flare of bipolar symptoms. Avoid corticosteroids when possible, especially kenalog   Behavioral issues    Lactose Intolerance (Gi) Other (See Comments)    throat swells/stomach hurts/constipation   Milk (Cow) Other (See Comments)   Triamcinolone  Acetonide     Behavioral issues    Past Medical History, Surgical history, Social history, and Family History were reviewed and updated.  Review of Systems: All other 10 point review of systems is negative.   Physical Exam:  vitals were not taken for this visit.   Wt Readings from Last 3 Encounters:  09/16/23 162 lb (73.5 kg)  07/17/23 174 lb 12.8 oz (79.3 kg)  06/27/23 174 lb 8 oz (79.2 kg)    Ocular: Sclerae unicteric, pupils equal, round and reactive to light Ear-nose-throat: Oropharynx clear, dentition fair Lymphatic: No cervical or supraclavicular adenopathy Lungs no rales or rhonchi, good excursion bilaterally Heart regular rate and rhythm, no murmur appreciated Abd soft, nontender, positive bowel sounds MSK no focal spinal tenderness, no joint edema Neuro: non-focal, well-oriented, appropriate  affect Breasts: Deferred   Lab Results  Component Value Date   WBC 4.2 09/16/2023   HGB 13.5 09/16/2023   HCT 39.3 09/16/2023   MCV 88.9 09/16/2023   PLT 254 09/16/2023   Lab Results  Component Value Date   FERRITIN 151 09/16/2023   IRON  195 (H) 09/16/2023   TIBC 284 09/16/2023   UIBC 89 (L) 09/16/2023   IRONPCTSAT 69 (H) 09/16/2023   Lab Results  Component Value Date   RETICCTPCT 1.7 09/16/2023   RBC 4.36 09/16/2023   RBC 4.42 09/16/2023   No results found for: KPAFRELGTCHN, LAMBDASER, KAPLAMBRATIO No results found for: IGGSERUM, IGA, IGMSERUM No results found for: STEPHANY CARLOTA BENSON MARKEL EARLA JOANNIE DOC VICK, SPEI   Chemistry      Component Value Date/Time   NA 142 07/17/2023 0854   K 4.1 07/17/2023 0854   CL 109 07/17/2023 0854   CO2 27 07/17/2023 0854   BUN 11 07/17/2023 0854   CREATININE 0.81 07/17/2023 0854      Component Value Date/Time   CALCIUM 9.7  07/17/2023 0854   ALKPHOS 46 07/17/2023 0854   AST 12 (L) 07/17/2023 0854   ALT 7 07/17/2023 0854   BILITOT 1.1 07/17/2023 0854       Impression and Plan: Ms. Stevens is a pleasant 28 yo female with history of iron  deficiency without anemia.  Iron   and B 12/folate studies are pending. We will replace if needed to maintain ferritin > 50 and start B 12 inj if needed.   Follow-up in 4 months.   Lauraine Pepper, NP 7/23/20251:00 PM

## 2023-12-20 ENCOUNTER — Other Ambulatory Visit (HOSPITAL_COMMUNITY): Payer: Self-pay

## 2023-12-20 ENCOUNTER — Encounter: Payer: Self-pay | Admitting: Family

## 2023-12-20 ENCOUNTER — Ambulatory Visit: Payer: Self-pay

## 2023-12-20 DIAGNOSIS — N941 Unspecified dyspareunia: Secondary | ICD-10-CM | POA: Diagnosis not present

## 2023-12-20 DIAGNOSIS — M6289 Other specified disorders of muscle: Secondary | ICD-10-CM | POA: Diagnosis not present

## 2023-12-20 DIAGNOSIS — R350 Frequency of micturition: Secondary | ICD-10-CM | POA: Diagnosis not present

## 2023-12-20 DIAGNOSIS — G44029 Chronic cluster headache, not intractable: Secondary | ICD-10-CM | POA: Diagnosis not present

## 2023-12-20 DIAGNOSIS — N3946 Mixed incontinence: Secondary | ICD-10-CM | POA: Diagnosis not present

## 2023-12-20 DIAGNOSIS — R519 Headache, unspecified: Secondary | ICD-10-CM | POA: Diagnosis not present

## 2023-12-20 MED ORDER — MAGNESIUM OXIDE -MG SUPPLEMENT 400 (240 MG) MG PO TABS
400.0000 mg | ORAL_TABLET | Freq: Every day | ORAL | 3 refills | Status: AC
  Filled 2023-12-20: qty 90, 90d supply, fill #0
  Filled 2024-03-24: qty 90, 90d supply, fill #1

## 2023-12-23 ENCOUNTER — Other Ambulatory Visit (HOSPITAL_COMMUNITY): Payer: Self-pay

## 2023-12-23 MED ORDER — ATENOLOL 25 MG PO TABS
25.0000 mg | ORAL_TABLET | Freq: Every day | ORAL | 5 refills | Status: DC
  Filled 2023-12-23: qty 30, 30d supply, fill #0
  Filled 2024-01-20: qty 30, 30d supply, fill #1
  Filled 2024-02-17 – 2024-02-19 (×3): qty 30, 30d supply, fill #2
  Filled 2024-03-18 – 2024-03-20 (×2): qty 30, 30d supply, fill #3
  Filled 2024-04-14: qty 30, 30d supply, fill #4

## 2023-12-24 ENCOUNTER — Other Ambulatory Visit (HOSPITAL_COMMUNITY): Payer: Self-pay

## 2023-12-24 ENCOUNTER — Other Ambulatory Visit: Payer: Self-pay

## 2023-12-25 ENCOUNTER — Other Ambulatory Visit (HOSPITAL_COMMUNITY): Payer: Self-pay

## 2023-12-25 MED ORDER — EMGALITY 120 MG/ML ~~LOC~~ SOSY
PREFILLED_SYRINGE | SUBCUTANEOUS | 5 refills | Status: AC
  Filled 2023-12-25: qty 2, 28d supply, fill #0
  Filled 2024-01-16: qty 2, 28d supply, fill #1
  Filled 2024-02-13: qty 2, 28d supply, fill #2
  Filled 2024-03-12: qty 2, 28d supply, fill #3
  Filled 2024-04-14 – 2024-06-09 (×3): qty 2, 28d supply, fill #4

## 2023-12-31 DIAGNOSIS — N941 Unspecified dyspareunia: Secondary | ICD-10-CM | POA: Diagnosis not present

## 2023-12-31 DIAGNOSIS — M6281 Muscle weakness (generalized): Secondary | ICD-10-CM | POA: Diagnosis not present

## 2023-12-31 DIAGNOSIS — R350 Frequency of micturition: Secondary | ICD-10-CM | POA: Diagnosis not present

## 2023-12-31 DIAGNOSIS — N3946 Mixed incontinence: Secondary | ICD-10-CM | POA: Diagnosis not present

## 2023-12-31 DIAGNOSIS — M62838 Other muscle spasm: Secondary | ICD-10-CM | POA: Diagnosis not present

## 2024-01-01 DIAGNOSIS — H53451 Other localized visual field defect, right eye: Secondary | ICD-10-CM | POA: Diagnosis not present

## 2024-01-02 DIAGNOSIS — E559 Vitamin D deficiency, unspecified: Secondary | ICD-10-CM | POA: Diagnosis not present

## 2024-01-02 DIAGNOSIS — R5383 Other fatigue: Secondary | ICD-10-CM | POA: Diagnosis not present

## 2024-01-03 ENCOUNTER — Other Ambulatory Visit: Payer: Self-pay | Admitting: Medical Genetics

## 2024-01-03 ENCOUNTER — Encounter: Payer: Self-pay | Admitting: Family

## 2024-01-06 ENCOUNTER — Other Ambulatory Visit (HOSPITAL_COMMUNITY): Payer: Self-pay

## 2024-01-06 DIAGNOSIS — M62838 Other muscle spasm: Secondary | ICD-10-CM | POA: Diagnosis not present

## 2024-01-06 DIAGNOSIS — M6281 Muscle weakness (generalized): Secondary | ICD-10-CM | POA: Diagnosis not present

## 2024-01-06 DIAGNOSIS — R5383 Other fatigue: Secondary | ICD-10-CM | POA: Diagnosis not present

## 2024-01-06 DIAGNOSIS — N3946 Mixed incontinence: Secondary | ICD-10-CM | POA: Diagnosis not present

## 2024-01-06 DIAGNOSIS — E559 Vitamin D deficiency, unspecified: Secondary | ICD-10-CM | POA: Diagnosis not present

## 2024-01-06 DIAGNOSIS — R350 Frequency of micturition: Secondary | ICD-10-CM | POA: Diagnosis not present

## 2024-01-06 DIAGNOSIS — M6289 Other specified disorders of muscle: Secondary | ICD-10-CM | POA: Diagnosis not present

## 2024-01-07 DIAGNOSIS — R29898 Other symptoms and signs involving the musculoskeletal system: Secondary | ICD-10-CM | POA: Diagnosis not present

## 2024-01-07 DIAGNOSIS — M25562 Pain in left knee: Secondary | ICD-10-CM | POA: Diagnosis not present

## 2024-01-07 DIAGNOSIS — G8929 Other chronic pain: Secondary | ICD-10-CM | POA: Diagnosis not present

## 2024-01-07 DIAGNOSIS — M25551 Pain in right hip: Secondary | ICD-10-CM | POA: Diagnosis not present

## 2024-01-07 DIAGNOSIS — R278 Other lack of coordination: Secondary | ICD-10-CM | POA: Diagnosis not present

## 2024-01-07 DIAGNOSIS — M25561 Pain in right knee: Secondary | ICD-10-CM | POA: Diagnosis not present

## 2024-01-07 DIAGNOSIS — R531 Weakness: Secondary | ICD-10-CM | POA: Diagnosis not present

## 2024-01-07 DIAGNOSIS — M25552 Pain in left hip: Secondary | ICD-10-CM | POA: Diagnosis not present

## 2024-01-07 DIAGNOSIS — M545 Low back pain, unspecified: Secondary | ICD-10-CM | POA: Diagnosis not present

## 2024-01-10 DIAGNOSIS — M9907 Segmental and somatic dysfunction of upper extremity: Secondary | ICD-10-CM | POA: Diagnosis not present

## 2024-01-10 DIAGNOSIS — M9906 Segmental and somatic dysfunction of lower extremity: Secondary | ICD-10-CM | POA: Diagnosis not present

## 2024-01-10 DIAGNOSIS — M9902 Segmental and somatic dysfunction of thoracic region: Secondary | ICD-10-CM | POA: Diagnosis not present

## 2024-01-10 DIAGNOSIS — M9903 Segmental and somatic dysfunction of lumbar region: Secondary | ICD-10-CM | POA: Diagnosis not present

## 2024-01-10 DIAGNOSIS — M62838 Other muscle spasm: Secondary | ICD-10-CM | POA: Diagnosis not present

## 2024-01-10 DIAGNOSIS — M9901 Segmental and somatic dysfunction of cervical region: Secondary | ICD-10-CM | POA: Diagnosis not present

## 2024-01-10 DIAGNOSIS — M9908 Segmental and somatic dysfunction of rib cage: Secondary | ICD-10-CM | POA: Diagnosis not present

## 2024-01-10 DIAGNOSIS — M9904 Segmental and somatic dysfunction of sacral region: Secondary | ICD-10-CM | POA: Diagnosis not present

## 2024-01-10 DIAGNOSIS — M9900 Segmental and somatic dysfunction of head region: Secondary | ICD-10-CM | POA: Diagnosis not present

## 2024-01-10 DIAGNOSIS — M9905 Segmental and somatic dysfunction of pelvic region: Secondary | ICD-10-CM | POA: Diagnosis not present

## 2024-01-13 DIAGNOSIS — R29898 Other symptoms and signs involving the musculoskeletal system: Secondary | ICD-10-CM | POA: Diagnosis not present

## 2024-01-13 DIAGNOSIS — M25551 Pain in right hip: Secondary | ICD-10-CM | POA: Diagnosis not present

## 2024-01-13 DIAGNOSIS — M545 Low back pain, unspecified: Secondary | ICD-10-CM | POA: Diagnosis not present

## 2024-01-13 DIAGNOSIS — G8929 Other chronic pain: Secondary | ICD-10-CM | POA: Diagnosis not present

## 2024-01-13 DIAGNOSIS — M25552 Pain in left hip: Secondary | ICD-10-CM | POA: Diagnosis not present

## 2024-01-13 DIAGNOSIS — M25561 Pain in right knee: Secondary | ICD-10-CM | POA: Diagnosis not present

## 2024-01-13 DIAGNOSIS — R531 Weakness: Secondary | ICD-10-CM | POA: Diagnosis not present

## 2024-01-13 DIAGNOSIS — R278 Other lack of coordination: Secondary | ICD-10-CM | POA: Diagnosis not present

## 2024-01-13 DIAGNOSIS — M25562 Pain in left knee: Secondary | ICD-10-CM | POA: Diagnosis not present

## 2024-01-16 DIAGNOSIS — F902 Attention-deficit hyperactivity disorder, combined type: Secondary | ICD-10-CM | POA: Diagnosis not present

## 2024-01-16 DIAGNOSIS — F3175 Bipolar disorder, in partial remission, most recent episode depressed: Secondary | ICD-10-CM | POA: Diagnosis not present

## 2024-01-16 DIAGNOSIS — F840 Autistic disorder: Secondary | ICD-10-CM | POA: Diagnosis not present

## 2024-01-16 DIAGNOSIS — F4312 Post-traumatic stress disorder, chronic: Secondary | ICD-10-CM | POA: Diagnosis not present

## 2024-01-16 DIAGNOSIS — F429 Obsessive-compulsive disorder, unspecified: Secondary | ICD-10-CM | POA: Diagnosis not present

## 2024-01-20 ENCOUNTER — Other Ambulatory Visit (HOSPITAL_COMMUNITY): Payer: Self-pay

## 2024-01-21 ENCOUNTER — Other Ambulatory Visit (HOSPITAL_COMMUNITY): Payer: Self-pay

## 2024-01-21 DIAGNOSIS — F902 Attention-deficit hyperactivity disorder, combined type: Secondary | ICD-10-CM | POA: Diagnosis not present

## 2024-01-21 DIAGNOSIS — F429 Obsessive-compulsive disorder, unspecified: Secondary | ICD-10-CM | POA: Diagnosis not present

## 2024-01-21 DIAGNOSIS — F3175 Bipolar disorder, in partial remission, most recent episode depressed: Secondary | ICD-10-CM | POA: Diagnosis not present

## 2024-01-21 DIAGNOSIS — F840 Autistic disorder: Secondary | ICD-10-CM | POA: Diagnosis not present

## 2024-01-21 DIAGNOSIS — F4312 Post-traumatic stress disorder, chronic: Secondary | ICD-10-CM | POA: Diagnosis not present

## 2024-01-22 DIAGNOSIS — M25552 Pain in left hip: Secondary | ICD-10-CM | POA: Diagnosis not present

## 2024-01-22 DIAGNOSIS — R278 Other lack of coordination: Secondary | ICD-10-CM | POA: Diagnosis not present

## 2024-01-22 DIAGNOSIS — M25562 Pain in left knee: Secondary | ICD-10-CM | POA: Diagnosis not present

## 2024-01-22 DIAGNOSIS — R29898 Other symptoms and signs involving the musculoskeletal system: Secondary | ICD-10-CM | POA: Diagnosis not present

## 2024-01-22 DIAGNOSIS — M6289 Other specified disorders of muscle: Secondary | ICD-10-CM | POA: Diagnosis not present

## 2024-01-22 DIAGNOSIS — R531 Weakness: Secondary | ICD-10-CM | POA: Diagnosis not present

## 2024-01-22 DIAGNOSIS — N3946 Mixed incontinence: Secondary | ICD-10-CM | POA: Diagnosis not present

## 2024-01-22 DIAGNOSIS — R350 Frequency of micturition: Secondary | ICD-10-CM | POA: Diagnosis not present

## 2024-01-22 DIAGNOSIS — G8929 Other chronic pain: Secondary | ICD-10-CM | POA: Diagnosis not present

## 2024-01-22 DIAGNOSIS — M25561 Pain in right knee: Secondary | ICD-10-CM | POA: Diagnosis not present

## 2024-01-22 DIAGNOSIS — M545 Low back pain, unspecified: Secondary | ICD-10-CM | POA: Diagnosis not present

## 2024-01-22 DIAGNOSIS — M25551 Pain in right hip: Secondary | ICD-10-CM | POA: Diagnosis not present

## 2024-01-28 DIAGNOSIS — F902 Attention-deficit hyperactivity disorder, combined type: Secondary | ICD-10-CM | POA: Diagnosis not present

## 2024-01-28 DIAGNOSIS — F4312 Post-traumatic stress disorder, chronic: Secondary | ICD-10-CM | POA: Diagnosis not present

## 2024-01-28 DIAGNOSIS — F3175 Bipolar disorder, in partial remission, most recent episode depressed: Secondary | ICD-10-CM | POA: Diagnosis not present

## 2024-01-28 DIAGNOSIS — R110 Nausea: Secondary | ICD-10-CM | POA: Diagnosis not present

## 2024-01-28 DIAGNOSIS — R140 Abdominal distension (gaseous): Secondary | ICD-10-CM | POA: Diagnosis not present

## 2024-01-28 DIAGNOSIS — R1319 Other dysphagia: Secondary | ICD-10-CM | POA: Diagnosis not present

## 2024-01-28 DIAGNOSIS — F840 Autistic disorder: Secondary | ICD-10-CM | POA: Diagnosis not present

## 2024-01-28 DIAGNOSIS — K219 Gastro-esophageal reflux disease without esophagitis: Secondary | ICD-10-CM | POA: Diagnosis not present

## 2024-01-28 DIAGNOSIS — F429 Obsessive-compulsive disorder, unspecified: Secondary | ICD-10-CM | POA: Diagnosis not present

## 2024-01-30 ENCOUNTER — Other Ambulatory Visit (HOSPITAL_COMMUNITY): Payer: Self-pay

## 2024-01-30 DIAGNOSIS — F319 Bipolar disorder, unspecified: Secondary | ICD-10-CM | POA: Diagnosis not present

## 2024-01-30 DIAGNOSIS — L732 Hidradenitis suppurativa: Secondary | ICD-10-CM | POA: Diagnosis not present

## 2024-01-30 DIAGNOSIS — M2550 Pain in unspecified joint: Secondary | ICD-10-CM | POA: Diagnosis not present

## 2024-01-30 DIAGNOSIS — M6281 Muscle weakness (generalized): Secondary | ICD-10-CM | POA: Diagnosis not present

## 2024-01-30 DIAGNOSIS — M357 Hypermobility syndrome: Secondary | ICD-10-CM | POA: Diagnosis not present

## 2024-01-30 DIAGNOSIS — G90A Postural orthostatic tachycardia syndrome (POTS): Secondary | ICD-10-CM | POA: Diagnosis not present

## 2024-01-30 DIAGNOSIS — N3946 Mixed incontinence: Secondary | ICD-10-CM | POA: Diagnosis not present

## 2024-01-30 DIAGNOSIS — N941 Unspecified dyspareunia: Secondary | ICD-10-CM | POA: Diagnosis not present

## 2024-01-30 DIAGNOSIS — I7300 Raynaud's syndrome without gangrene: Secondary | ICD-10-CM | POA: Diagnosis not present

## 2024-01-30 DIAGNOSIS — M62838 Other muscle spasm: Secondary | ICD-10-CM | POA: Diagnosis not present

## 2024-01-30 MED ORDER — METOPROLOL TARTRATE 25 MG PO TABS
25.0000 mg | ORAL_TABLET | Freq: Two times a day (BID) | ORAL | 5 refills | Status: AC
  Filled 2024-01-30: qty 60, 30d supply, fill #0
  Filled 2024-02-22: qty 60, 30d supply, fill #1
  Filled 2024-03-23: qty 60, 30d supply, fill #2
  Filled 2024-04-22 – 2024-05-02 (×2): qty 60, 30d supply, fill #3
  Filled 2024-05-28: qty 60, 30d supply, fill #4
  Filled 2024-07-01: qty 60, 30d supply, fill #5

## 2024-01-31 ENCOUNTER — Encounter: Payer: Self-pay | Admitting: Dermatology

## 2024-01-31 DIAGNOSIS — M25551 Pain in right hip: Secondary | ICD-10-CM | POA: Diagnosis not present

## 2024-01-31 DIAGNOSIS — M25562 Pain in left knee: Secondary | ICD-10-CM | POA: Diagnosis not present

## 2024-01-31 DIAGNOSIS — R29898 Other symptoms and signs involving the musculoskeletal system: Secondary | ICD-10-CM | POA: Diagnosis not present

## 2024-01-31 DIAGNOSIS — M545 Low back pain, unspecified: Secondary | ICD-10-CM | POA: Diagnosis not present

## 2024-01-31 DIAGNOSIS — M25561 Pain in right knee: Secondary | ICD-10-CM | POA: Diagnosis not present

## 2024-01-31 DIAGNOSIS — M25552 Pain in left hip: Secondary | ICD-10-CM | POA: Diagnosis not present

## 2024-01-31 DIAGNOSIS — R278 Other lack of coordination: Secondary | ICD-10-CM | POA: Diagnosis not present

## 2024-01-31 DIAGNOSIS — M5416 Radiculopathy, lumbar region: Secondary | ICD-10-CM | POA: Diagnosis not present

## 2024-01-31 DIAGNOSIS — M542 Cervicalgia: Secondary | ICD-10-CM | POA: Diagnosis not present

## 2024-01-31 DIAGNOSIS — R531 Weakness: Secondary | ICD-10-CM | POA: Diagnosis not present

## 2024-01-31 DIAGNOSIS — M5414 Radiculopathy, thoracic region: Secondary | ICD-10-CM | POA: Diagnosis not present

## 2024-01-31 DIAGNOSIS — G8929 Other chronic pain: Secondary | ICD-10-CM | POA: Diagnosis not present

## 2024-02-03 ENCOUNTER — Other Ambulatory Visit (HOSPITAL_COMMUNITY): Payer: Self-pay

## 2024-02-03 ENCOUNTER — Ambulatory Visit (INDEPENDENT_AMBULATORY_CARE_PROVIDER_SITE_OTHER)

## 2024-02-03 ENCOUNTER — Other Ambulatory Visit (HOSPITAL_COMMUNITY)
Admission: RE | Admit: 2024-02-03 | Discharge: 2024-02-03 | Disposition: A | Discharge status: Home / Self Care | Payer: Self-pay | Source: Ambulatory Visit | Attending: Medical Genetics | Admitting: Medical Genetics

## 2024-02-03 ENCOUNTER — Ambulatory Visit (INDEPENDENT_AMBULATORY_CARE_PROVIDER_SITE_OTHER): Admitting: Podiatry

## 2024-02-03 ENCOUNTER — Other Ambulatory Visit: Payer: Self-pay

## 2024-02-03 DIAGNOSIS — M2142 Flat foot [pes planus] (acquired), left foot: Secondary | ICD-10-CM | POA: Diagnosis not present

## 2024-02-03 DIAGNOSIS — M795 Residual foreign body in soft tissue: Secondary | ICD-10-CM | POA: Diagnosis not present

## 2024-02-03 DIAGNOSIS — L603 Nail dystrophy: Secondary | ICD-10-CM

## 2024-02-03 DIAGNOSIS — L858 Other specified epidermal thickening: Secondary | ICD-10-CM | POA: Diagnosis not present

## 2024-02-03 DIAGNOSIS — M2141 Flat foot [pes planus] (acquired), right foot: Secondary | ICD-10-CM | POA: Diagnosis not present

## 2024-02-03 DIAGNOSIS — R238 Other skin changes: Secondary | ICD-10-CM | POA: Diagnosis not present

## 2024-02-03 DIAGNOSIS — L608 Other nail disorders: Secondary | ICD-10-CM | POA: Diagnosis not present

## 2024-02-03 NOTE — Progress Notes (Signed)
 Subjective: Chief Complaint  Patient presents with   Nail Problem    Pt stated that her nails are discolored and she has a painful place on the bottom of her foot    28 year old female presents the office with a couple concerns.  She states that she just hit her left big toenail just over the last day due to some bleeding to the nail.  She is also concerned that lichen planus.  She has been using topical Jublia  for nail fungus which has not been very helpful.  She is also concerned by complaints other parts of her body and she has follow-up with dermatology for this.    She is also noticed some blood at the base of the right big toenail.  She notes her father started but the nail was doing well.  No drainage or pus.  She also has a splinter in the bottom of her big toe resulting in callus.  She thought that she got the splinter out of the callus remains.  No swelling or redness or any drainage.   Objective: AAO x3, NAD DP/PT pulses palpable bilaterally, CRT less than 3 seconds On the right hallux toenail there is some dried blood present on the proximal nail fold.  The nail is firmly attached.  There is no edema, erythema or signs of infection. On the left hallux, the nail is hypertrophic, dystrophic.  There are some fresh blood around the edges from the recent injury.  The nail is loose but on the nailbed and only attached in the proximal nail fold.  No purulence or signs of infection. On the plantar aspect left foot submetatarsal 2 is a punctate annular hyperkeratotic lesion.  At the debrided there is no underlying ulceration or evidence of foreign object that I can identify today. Left foot present. No pain with calf compression, swelling, warmth, erythema  Assessment: Left foot concern for residual foreign body; onychodystrophy left hallux; subtle hematoma right hallux  Plan: -Treatment options discussed including all alternatives, risks, and complications -Etiology of symptoms were  discussed  Right hallux subungual hematoma - This will likely grow on its own.  Will need to monitor.  No signs of infection today or pain.  Left hallux onychodystrophy - Recent injury.  The nail is quite loose.  May come up on its own.  Discussed nail removal today.  However we should agree to let the nail come off on its own if needed.  Discussed Epsom salt soaks.  If it becomes symptomatic or is not coming off then we can consider nail removal. - I did debride the nails of this for culture, pathology.  Concern for residual foreign body plantar left foot - Serpe debrided hyperkeratotic lesion.  Patient like me to send this skin for culture.  I sent this to the lab as well although it appears to be a callus.  I ordered ultrasound to further evaluate any underlying foreign object.  Radiology - 3 views of the foot were obtained.  Is no definitive evidence of acute fracture.  There is no foreign object identified.  Return for culture results, ultrasound results.  Donnice JONELLE Fees DPM

## 2024-02-04 DIAGNOSIS — H539 Unspecified visual disturbance: Secondary | ICD-10-CM | POA: Diagnosis not present

## 2024-02-04 DIAGNOSIS — M5414 Radiculopathy, thoracic region: Secondary | ICD-10-CM | POA: Diagnosis not present

## 2024-02-04 DIAGNOSIS — M4712 Other spondylosis with myelopathy, cervical region: Secondary | ICD-10-CM | POA: Diagnosis not present

## 2024-02-04 DIAGNOSIS — M5416 Radiculopathy, lumbar region: Secondary | ICD-10-CM | POA: Diagnosis not present

## 2024-02-04 DIAGNOSIS — Q159 Congenital malformation of eye, unspecified: Secondary | ICD-10-CM | POA: Diagnosis not present

## 2024-02-05 ENCOUNTER — Other Ambulatory Visit (HOSPITAL_COMMUNITY): Payer: Self-pay

## 2024-02-05 DIAGNOSIS — F319 Bipolar disorder, unspecified: Secondary | ICD-10-CM | POA: Diagnosis not present

## 2024-02-05 DIAGNOSIS — F411 Generalized anxiety disorder: Secondary | ICD-10-CM | POA: Diagnosis not present

## 2024-02-05 DIAGNOSIS — F902 Attention-deficit hyperactivity disorder, combined type: Secondary | ICD-10-CM | POA: Diagnosis not present

## 2024-02-05 MED ORDER — LISDEXAMFETAMINE DIMESYLATE 30 MG PO CAPS
30.0000 mg | ORAL_CAPSULE | Freq: Two times a day (BID) | ORAL | 0 refills | Status: DC
  Filled 2024-02-05: qty 28, 14d supply, fill #0

## 2024-02-05 MED ORDER — BUPROPION HCL ER (XL) 150 MG PO TB24
150.0000 mg | ORAL_TABLET | Freq: Every day | ORAL | 1 refills | Status: DC
  Filled 2024-02-05: qty 30, 30d supply, fill #0
  Filled 2024-03-01: qty 30, 30d supply, fill #1

## 2024-02-06 ENCOUNTER — Ambulatory Visit
Admission: RE | Admit: 2024-02-06 | Discharge: 2024-02-06 | Disposition: A | Discharge status: Home / Self Care | Source: Ambulatory Visit | Attending: Podiatry | Admitting: Podiatry

## 2024-02-06 ENCOUNTER — Other Ambulatory Visit: Payer: Self-pay

## 2024-02-06 ENCOUNTER — Other Ambulatory Visit (HOSPITAL_COMMUNITY): Payer: Self-pay

## 2024-02-06 DIAGNOSIS — M795 Residual foreign body in soft tissue: Secondary | ICD-10-CM

## 2024-02-06 DIAGNOSIS — Q7962 Hypermobile Ehlers-Danlos syndrome: Secondary | ICD-10-CM | POA: Diagnosis not present

## 2024-02-06 DIAGNOSIS — R1011 Right upper quadrant pain: Secondary | ICD-10-CM | POA: Diagnosis not present

## 2024-02-06 DIAGNOSIS — S90852A Superficial foreign body, left foot, initial encounter: Secondary | ICD-10-CM | POA: Diagnosis not present

## 2024-02-06 DIAGNOSIS — N809 Endometriosis, unspecified: Secondary | ICD-10-CM | POA: Diagnosis not present

## 2024-02-06 DIAGNOSIS — K5900 Constipation, unspecified: Secondary | ICD-10-CM | POA: Diagnosis not present

## 2024-02-06 MED ORDER — CILIDINIUM-CHLORDIAZEPOXIDE 2.5-5 MG PO CAPS
1.0000 | ORAL_CAPSULE | Freq: Four times a day (QID) | ORAL | 5 refills | Status: AC
  Filled 2024-02-06: qty 120, 30d supply, fill #0
  Filled 2024-03-01 – 2024-05-04 (×2): qty 120, 30d supply, fill #1

## 2024-02-07 ENCOUNTER — Other Ambulatory Visit (HOSPITAL_COMMUNITY): Payer: Self-pay

## 2024-02-07 ENCOUNTER — Other Ambulatory Visit

## 2024-02-07 DIAGNOSIS — H5340 Unspecified visual field defects: Secondary | ICD-10-CM | POA: Diagnosis not present

## 2024-02-07 DIAGNOSIS — Q6589 Other specified congenital deformities of hip: Secondary | ICD-10-CM | POA: Diagnosis not present

## 2024-02-07 DIAGNOSIS — Q7962 Hypermobile Ehlers-Danlos syndrome: Secondary | ICD-10-CM | POA: Diagnosis not present

## 2024-02-08 DIAGNOSIS — Z79899 Other long term (current) drug therapy: Secondary | ICD-10-CM | POA: Diagnosis not present

## 2024-02-08 DIAGNOSIS — Z975 Presence of (intrauterine) contraceptive device: Secondary | ICD-10-CM | POA: Diagnosis not present

## 2024-02-08 DIAGNOSIS — R202 Paresthesia of skin: Secondary | ICD-10-CM | POA: Diagnosis not present

## 2024-02-08 DIAGNOSIS — H53451 Other localized visual field defect, right eye: Secondary | ICD-10-CM | POA: Diagnosis not present

## 2024-02-08 DIAGNOSIS — H53411 Scotoma involving central area, right eye: Secondary | ICD-10-CM | POA: Diagnosis not present

## 2024-02-08 DIAGNOSIS — Z91011 Allergy to milk products: Secondary | ICD-10-CM | POA: Diagnosis not present

## 2024-02-08 DIAGNOSIS — H5211 Myopia, right eye: Secondary | ICD-10-CM | POA: Diagnosis not present

## 2024-02-08 DIAGNOSIS — H5340 Unspecified visual field defects: Secondary | ICD-10-CM | POA: Diagnosis not present

## 2024-02-08 DIAGNOSIS — Q112 Microphthalmos: Secondary | ICD-10-CM | POA: Diagnosis not present

## 2024-02-08 DIAGNOSIS — R519 Headache, unspecified: Secondary | ICD-10-CM | POA: Diagnosis not present

## 2024-02-08 DIAGNOSIS — Z888 Allergy status to other drugs, medicaments and biological substances status: Secondary | ICD-10-CM | POA: Diagnosis not present

## 2024-02-08 DIAGNOSIS — R200 Anesthesia of skin: Secondary | ICD-10-CM | POA: Diagnosis not present

## 2024-02-09 DIAGNOSIS — H53411 Scotoma involving central area, right eye: Secondary | ICD-10-CM | POA: Diagnosis not present

## 2024-02-09 DIAGNOSIS — H5340 Unspecified visual field defects: Secondary | ICD-10-CM | POA: Diagnosis not present

## 2024-02-09 DIAGNOSIS — R519 Headache, unspecified: Secondary | ICD-10-CM | POA: Diagnosis not present

## 2024-02-09 DIAGNOSIS — H53451 Other localized visual field defect, right eye: Secondary | ICD-10-CM | POA: Diagnosis not present

## 2024-02-10 ENCOUNTER — Other Ambulatory Visit: Payer: Self-pay | Admitting: Podiatry

## 2024-02-10 DIAGNOSIS — M25562 Pain in left knee: Secondary | ICD-10-CM | POA: Diagnosis not present

## 2024-02-10 DIAGNOSIS — R531 Weakness: Secondary | ICD-10-CM | POA: Diagnosis not present

## 2024-02-10 DIAGNOSIS — G8929 Other chronic pain: Secondary | ICD-10-CM | POA: Diagnosis not present

## 2024-02-10 DIAGNOSIS — M25561 Pain in right knee: Secondary | ICD-10-CM | POA: Diagnosis not present

## 2024-02-10 DIAGNOSIS — M542 Cervicalgia: Secondary | ICD-10-CM | POA: Diagnosis not present

## 2024-02-10 DIAGNOSIS — M545 Low back pain, unspecified: Secondary | ICD-10-CM | POA: Diagnosis not present

## 2024-02-10 DIAGNOSIS — M25552 Pain in left hip: Secondary | ICD-10-CM | POA: Diagnosis not present

## 2024-02-10 DIAGNOSIS — R29898 Other symptoms and signs involving the musculoskeletal system: Secondary | ICD-10-CM | POA: Diagnosis not present

## 2024-02-10 DIAGNOSIS — M25551 Pain in right hip: Secondary | ICD-10-CM | POA: Diagnosis not present

## 2024-02-10 DIAGNOSIS — R278 Other lack of coordination: Secondary | ICD-10-CM | POA: Diagnosis not present

## 2024-02-11 DIAGNOSIS — F902 Attention-deficit hyperactivity disorder, combined type: Secondary | ICD-10-CM | POA: Diagnosis not present

## 2024-02-11 DIAGNOSIS — F429 Obsessive-compulsive disorder, unspecified: Secondary | ICD-10-CM | POA: Diagnosis not present

## 2024-02-11 DIAGNOSIS — F3175 Bipolar disorder, in partial remission, most recent episode depressed: Secondary | ICD-10-CM | POA: Diagnosis not present

## 2024-02-11 DIAGNOSIS — F4312 Post-traumatic stress disorder, chronic: Secondary | ICD-10-CM | POA: Diagnosis not present

## 2024-02-11 DIAGNOSIS — F840 Autistic disorder: Secondary | ICD-10-CM | POA: Diagnosis not present

## 2024-02-11 LAB — GENECONNECT MOLECULAR SCREEN: Genetic Analysis Overall Interpretation: NEGATIVE

## 2024-02-12 ENCOUNTER — Other Ambulatory Visit (HOSPITAL_COMMUNITY): Payer: Self-pay

## 2024-02-12 ENCOUNTER — Ambulatory Visit: Payer: Self-pay | Admitting: Podiatry

## 2024-02-12 NOTE — Progress Notes (Signed)
 Called patient to get scheduled- left message with given date and time so she can check her schedule as well.

## 2024-02-12 NOTE — Progress Notes (Signed)
 Pt is scheduled for 9/23 @ 2:15.

## 2024-02-13 DIAGNOSIS — G588 Other specified mononeuropathies: Secondary | ICD-10-CM | POA: Diagnosis not present

## 2024-02-13 DIAGNOSIS — M797 Fibromyalgia: Secondary | ICD-10-CM | POA: Diagnosis not present

## 2024-02-14 ENCOUNTER — Other Ambulatory Visit (HOSPITAL_COMMUNITY): Payer: Self-pay

## 2024-02-17 ENCOUNTER — Other Ambulatory Visit (HOSPITAL_COMMUNITY): Payer: Self-pay

## 2024-02-17 DIAGNOSIS — H5319 Other subjective visual disturbances: Secondary | ICD-10-CM | POA: Diagnosis not present

## 2024-02-17 DIAGNOSIS — H538 Other visual disturbances: Secondary | ICD-10-CM | POA: Diagnosis not present

## 2024-02-18 ENCOUNTER — Other Ambulatory Visit (HOSPITAL_COMMUNITY): Payer: Self-pay

## 2024-02-18 ENCOUNTER — Ambulatory Visit (INDEPENDENT_AMBULATORY_CARE_PROVIDER_SITE_OTHER): Admitting: Podiatry

## 2024-02-18 DIAGNOSIS — L603 Nail dystrophy: Secondary | ICD-10-CM | POA: Diagnosis not present

## 2024-02-18 DIAGNOSIS — F840 Autistic disorder: Secondary | ICD-10-CM | POA: Diagnosis not present

## 2024-02-18 DIAGNOSIS — F902 Attention-deficit hyperactivity disorder, combined type: Secondary | ICD-10-CM | POA: Diagnosis not present

## 2024-02-18 DIAGNOSIS — F3175 Bipolar disorder, in partial remission, most recent episode depressed: Secondary | ICD-10-CM | POA: Diagnosis not present

## 2024-02-18 DIAGNOSIS — F429 Obsessive-compulsive disorder, unspecified: Secondary | ICD-10-CM | POA: Diagnosis not present

## 2024-02-18 DIAGNOSIS — F4312 Post-traumatic stress disorder, chronic: Secondary | ICD-10-CM | POA: Diagnosis not present

## 2024-02-18 DIAGNOSIS — L03039 Cellulitis of unspecified toe: Secondary | ICD-10-CM | POA: Diagnosis not present

## 2024-02-18 NOTE — Progress Notes (Signed)
 Subjective: No chief complaint on file.  28 year old female presents the office today for concerns of her left big toenail and for further biopsy.  She had a superficial biopsy obtained which was inconclusive and she wants to proceed with having the nail removed and further biopsy.  No swelling redness or any drainage.  Objective: AAO x3, NAD DP/PT pulses palpable bilaterally, CRT less than 3 seconds No remains unchanged.  Hypertrophic, dystrophic and has yellow, brown discoloration.  No hyperpigmentation.  There is no edema, erythema or signs of infection.  There are no open lesions otherwise. No pain with calf compression, swelling, warmth, erythema  Assessment: Onychodystrophy left hallux  Plan: -All treatment options discussed with the patient including all alternatives, risks, complications.  -Again reviewed the prior culture, pathology.  We decided to proceed with further biopsy of the toenail. -At this time, he is likely to proceed with total nail removal without chemical matricectomy to the left hallux and biopsy due to continued symptoms and inconclusive biopsy results. Risks and complications were discussed with the patient for which they understand and  verbally consent to the procedure. Under sterile conditions a total of 3 mL of a mixture of 2% lidocaine  plain and 0.5% Marcaine  plain was infiltrated in a hallux block fashion. Once anesthetized, the skin was prepped in sterile fashion. A tourniquet was then applied.  The hallux toenails removed in total as well as some surrounding skin without any complications.  Nailbed appeared to be healthy and there is no hyperpigmentation or skin changes.  Once the nail was removed, the area was debrided and the underlying skin was intact. The area was irrigated and hemostasis was obtained.  A dry sterile dressing was applied. After application of the dressing the tourniquet was removed and there is found to be an immediate capillary refill time to  the digit. The patient tolerated the procedure well any complications. Post procedure instructions were discussed the patient for which he verbally understood. Follow-up in one week for nail check or sooner if any problems are to arise. Discussed signs/symptoms of worsening infection and directed to call the office immediately should any occur or go directly to the emergency room. In the meantime, encouraged to call the office with any questions, concerns, changes symptoms. -Nail sent for pathology (completed form and sent a copy of the [facility] results as well to [facility]) -Patient encouraged to call the office with any questions, concerns, change in symptoms.   Amanda Gill Fees DPM

## 2024-02-18 NOTE — Patient Instructions (Signed)

## 2024-02-19 ENCOUNTER — Other Ambulatory Visit (HOSPITAL_COMMUNITY): Payer: Self-pay

## 2024-02-19 ENCOUNTER — Encounter: Payer: Self-pay | Admitting: Family

## 2024-02-19 DIAGNOSIS — Z713 Dietary counseling and surveillance: Secondary | ICD-10-CM | POA: Diagnosis not present

## 2024-02-19 DIAGNOSIS — F4310 Post-traumatic stress disorder, unspecified: Secondary | ICD-10-CM | POA: Diagnosis not present

## 2024-02-19 DIAGNOSIS — F319 Bipolar disorder, unspecified: Secondary | ICD-10-CM | POA: Diagnosis not present

## 2024-02-19 DIAGNOSIS — Z789 Other specified health status: Secondary | ICD-10-CM | POA: Diagnosis not present

## 2024-02-19 DIAGNOSIS — F902 Attention-deficit hyperactivity disorder, combined type: Secondary | ICD-10-CM | POA: Diagnosis not present

## 2024-02-19 MED ORDER — LISDEXAMFETAMINE DIMESYLATE 30 MG PO CAPS
30.0000 mg | ORAL_CAPSULE | Freq: Two times a day (BID) | ORAL | 0 refills | Status: DC
  Filled 2024-02-19 – 2024-02-28 (×2): qty 60, 30d supply, fill #0

## 2024-02-19 MED ORDER — BUPROPION HCL ER (XL) 300 MG PO TB24
300.0000 mg | ORAL_TABLET | Freq: Every day | ORAL | 2 refills | Status: AC
  Filled 2024-02-19 – 2024-05-13 (×2): qty 30, 30d supply, fill #0
  Filled 2024-06-11: qty 30, 30d supply, fill #1

## 2024-02-19 MED ORDER — CAPLYTA 42 MG PO CAPS
42.0000 mg | ORAL_CAPSULE | Freq: Every day | ORAL | 2 refills | Status: DC
  Filled 2024-02-19: qty 30, 30d supply, fill #0

## 2024-02-20 ENCOUNTER — Other Ambulatory Visit (HOSPITAL_COMMUNITY): Payer: Self-pay

## 2024-02-20 ENCOUNTER — Encounter: Payer: Self-pay | Admitting: Family

## 2024-02-20 DIAGNOSIS — R768 Other specified abnormal immunological findings in serum: Secondary | ICD-10-CM | POA: Diagnosis not present

## 2024-02-20 DIAGNOSIS — R899 Unspecified abnormal finding in specimens from other organs, systems and tissues: Secondary | ICD-10-CM | POA: Diagnosis not present

## 2024-02-23 ENCOUNTER — Encounter: Payer: Self-pay | Admitting: Family

## 2024-02-25 DIAGNOSIS — F4312 Post-traumatic stress disorder, chronic: Secondary | ICD-10-CM | POA: Diagnosis not present

## 2024-02-25 DIAGNOSIS — F840 Autistic disorder: Secondary | ICD-10-CM | POA: Diagnosis not present

## 2024-02-25 DIAGNOSIS — F902 Attention-deficit hyperactivity disorder, combined type: Secondary | ICD-10-CM | POA: Diagnosis not present

## 2024-02-25 DIAGNOSIS — F429 Obsessive-compulsive disorder, unspecified: Secondary | ICD-10-CM | POA: Diagnosis not present

## 2024-02-25 DIAGNOSIS — F3175 Bipolar disorder, in partial remission, most recent episode depressed: Secondary | ICD-10-CM | POA: Diagnosis not present

## 2024-02-26 ENCOUNTER — Other Ambulatory Visit (HOSPITAL_COMMUNITY): Payer: Self-pay

## 2024-02-28 ENCOUNTER — Encounter: Payer: Self-pay | Admitting: Family

## 2024-02-28 ENCOUNTER — Other Ambulatory Visit (HOSPITAL_COMMUNITY): Payer: Self-pay

## 2024-02-28 ENCOUNTER — Other Ambulatory Visit: Payer: Self-pay

## 2024-03-02 ENCOUNTER — Ambulatory Visit: Admitting: Podiatry

## 2024-03-02 DIAGNOSIS — R7689 Other specified abnormal immunological findings in serum: Secondary | ICD-10-CM | POA: Diagnosis not present

## 2024-03-02 DIAGNOSIS — N3941 Urge incontinence: Secondary | ICD-10-CM | POA: Diagnosis not present

## 2024-03-02 DIAGNOSIS — R350 Frequency of micturition: Secondary | ICD-10-CM | POA: Diagnosis not present

## 2024-03-03 ENCOUNTER — Ambulatory Visit: Admitting: Podiatry

## 2024-03-03 DIAGNOSIS — F902 Attention-deficit hyperactivity disorder, combined type: Secondary | ICD-10-CM | POA: Diagnosis not present

## 2024-03-03 DIAGNOSIS — F4312 Post-traumatic stress disorder, chronic: Secondary | ICD-10-CM | POA: Diagnosis not present

## 2024-03-03 DIAGNOSIS — F429 Obsessive-compulsive disorder, unspecified: Secondary | ICD-10-CM | POA: Diagnosis not present

## 2024-03-03 DIAGNOSIS — F3175 Bipolar disorder, in partial remission, most recent episode depressed: Secondary | ICD-10-CM | POA: Diagnosis not present

## 2024-03-03 DIAGNOSIS — F840 Autistic disorder: Secondary | ICD-10-CM | POA: Diagnosis not present

## 2024-03-05 ENCOUNTER — Encounter: Payer: Self-pay | Admitting: Family

## 2024-03-05 ENCOUNTER — Other Ambulatory Visit: Payer: Self-pay

## 2024-03-05 DIAGNOSIS — R5383 Other fatigue: Secondary | ICD-10-CM | POA: Diagnosis not present

## 2024-03-05 DIAGNOSIS — R110 Nausea: Secondary | ICD-10-CM | POA: Diagnosis not present

## 2024-03-05 DIAGNOSIS — R8290 Unspecified abnormal findings in urine: Secondary | ICD-10-CM | POA: Diagnosis not present

## 2024-03-06 ENCOUNTER — Other Ambulatory Visit: Payer: Self-pay | Admitting: Podiatry

## 2024-03-09 ENCOUNTER — Encounter: Payer: Self-pay | Admitting: Family

## 2024-03-09 ENCOUNTER — Other Ambulatory Visit (HOSPITAL_COMMUNITY): Payer: Self-pay

## 2024-03-09 DIAGNOSIS — R5383 Other fatigue: Secondary | ICD-10-CM | POA: Diagnosis not present

## 2024-03-09 DIAGNOSIS — R8290 Unspecified abnormal findings in urine: Secondary | ICD-10-CM | POA: Diagnosis not present

## 2024-03-09 DIAGNOSIS — T7819XD Other adverse food reactions, not elsewhere classified, subsequent encounter: Secondary | ICD-10-CM | POA: Diagnosis not present

## 2024-03-09 MED ORDER — CROMOLYN SODIUM 100 MG/5ML PO CONC
200.0000 mg | Freq: Three times a day (TID) | ORAL | 0 refills | Status: DC
  Filled 2024-03-09: qty 960, 24d supply, fill #0
  Filled 2024-03-30: qty 960, 24d supply, fill #1
  Filled 2024-04-23: qty 960, 24d supply, fill #2
  Filled 2024-05-22 – 2024-05-23 (×2): qty 480, 12d supply, fill #3

## 2024-03-10 ENCOUNTER — Encounter: Payer: Self-pay | Admitting: Podiatry

## 2024-03-10 ENCOUNTER — Other Ambulatory Visit (HOSPITAL_COMMUNITY): Payer: Self-pay

## 2024-03-10 DIAGNOSIS — M542 Cervicalgia: Secondary | ICD-10-CM | POA: Diagnosis not present

## 2024-03-10 DIAGNOSIS — Z0389 Encounter for observation for other suspected diseases and conditions ruled out: Secondary | ICD-10-CM | POA: Diagnosis not present

## 2024-03-10 DIAGNOSIS — Z1152 Encounter for screening for COVID-19: Secondary | ICD-10-CM | POA: Diagnosis not present

## 2024-03-10 DIAGNOSIS — R519 Headache, unspecified: Secondary | ICD-10-CM | POA: Diagnosis not present

## 2024-03-10 DIAGNOSIS — J3489 Other specified disorders of nose and nasal sinuses: Secondary | ICD-10-CM | POA: Diagnosis not present

## 2024-03-10 DIAGNOSIS — G90A Postural orthostatic tachycardia syndrome (POTS): Secondary | ICD-10-CM | POA: Diagnosis not present

## 2024-03-10 NOTE — Telephone Encounter (Signed)
 Patient is scheduling appointment to review.

## 2024-03-10 NOTE — Telephone Encounter (Signed)
 Patient asks what is the normal wait time for lab results to come back? She mentioned her dermatology appointment is October 27th and she is hoping to have results to share with that provider.

## 2024-03-11 ENCOUNTER — Other Ambulatory Visit (HOSPITAL_COMMUNITY): Payer: Self-pay

## 2024-03-11 DIAGNOSIS — J3489 Other specified disorders of nose and nasal sinuses: Secondary | ICD-10-CM | POA: Diagnosis not present

## 2024-03-11 DIAGNOSIS — Z0389 Encounter for observation for other suspected diseases and conditions ruled out: Secondary | ICD-10-CM | POA: Diagnosis not present

## 2024-03-13 ENCOUNTER — Other Ambulatory Visit (HOSPITAL_COMMUNITY): Payer: Self-pay

## 2024-03-13 DIAGNOSIS — Z13220 Encounter for screening for lipoid disorders: Secondary | ICD-10-CM | POA: Diagnosis not present

## 2024-03-16 DIAGNOSIS — T7819XD Other adverse food reactions, not elsewhere classified, subsequent encounter: Secondary | ICD-10-CM | POA: Diagnosis not present

## 2024-03-17 ENCOUNTER — Telehealth: Payer: Self-pay | Admitting: Podiatry

## 2024-03-17 ENCOUNTER — Other Ambulatory Visit (HOSPITAL_COMMUNITY): Payer: Self-pay

## 2024-03-17 DIAGNOSIS — F429 Obsessive-compulsive disorder, unspecified: Secondary | ICD-10-CM | POA: Diagnosis not present

## 2024-03-17 DIAGNOSIS — F4312 Post-traumatic stress disorder, chronic: Secondary | ICD-10-CM | POA: Diagnosis not present

## 2024-03-17 DIAGNOSIS — M62838 Other muscle spasm: Secondary | ICD-10-CM | POA: Diagnosis not present

## 2024-03-17 DIAGNOSIS — M6289 Other specified disorders of muscle: Secondary | ICD-10-CM | POA: Diagnosis not present

## 2024-03-17 DIAGNOSIS — N3946 Mixed incontinence: Secondary | ICD-10-CM | POA: Diagnosis not present

## 2024-03-17 DIAGNOSIS — R1023 Pelvic and perineal pain bilateral: Secondary | ICD-10-CM | POA: Diagnosis not present

## 2024-03-17 DIAGNOSIS — F3175 Bipolar disorder, in partial remission, most recent episode depressed: Secondary | ICD-10-CM | POA: Diagnosis not present

## 2024-03-17 DIAGNOSIS — F902 Attention-deficit hyperactivity disorder, combined type: Secondary | ICD-10-CM | POA: Diagnosis not present

## 2024-03-17 DIAGNOSIS — M6281 Muscle weakness (generalized): Secondary | ICD-10-CM | POA: Diagnosis not present

## 2024-03-17 DIAGNOSIS — F840 Autistic disorder: Secondary | ICD-10-CM | POA: Diagnosis not present

## 2024-03-17 NOTE — Telephone Encounter (Signed)
 Due to patient living an hour away, she would like results from labs sent to mychart to give to her dermatologist before 03/23/24. Patient telephone number [phone]

## 2024-03-18 ENCOUNTER — Other Ambulatory Visit (HOSPITAL_COMMUNITY): Payer: Self-pay

## 2024-03-18 ENCOUNTER — Other Ambulatory Visit: Payer: Self-pay

## 2024-03-18 DIAGNOSIS — F902 Attention-deficit hyperactivity disorder, combined type: Secondary | ICD-10-CM | POA: Diagnosis not present

## 2024-03-18 DIAGNOSIS — F411 Generalized anxiety disorder: Secondary | ICD-10-CM | POA: Diagnosis not present

## 2024-03-18 DIAGNOSIS — F319 Bipolar disorder, unspecified: Secondary | ICD-10-CM | POA: Diagnosis not present

## 2024-03-18 MED ORDER — LISDEXAMFETAMINE DIMESYLATE 30 MG PO CAPS
30.0000 mg | ORAL_CAPSULE | Freq: Two times a day (BID) | ORAL | 0 refills | Status: DC
  Filled 2024-04-05: qty 60, 30d supply, fill #0

## 2024-03-18 MED ORDER — BUPROPION HCL ER (XL) 150 MG PO TB24
150.0000 mg | ORAL_TABLET | Freq: Every day | ORAL | 1 refills | Status: DC
  Filled 2024-03-18: qty 30, 30d supply, fill #0

## 2024-03-18 MED ORDER — LISDEXAMFETAMINE DIMESYLATE 30 MG PO CAPS
30.0000 mg | ORAL_CAPSULE | Freq: Two times a day (BID) | ORAL | 0 refills | Status: AC
  Filled 2024-05-04 – 2024-06-08 (×6): qty 60, 30d supply, fill #0

## 2024-03-18 MED ORDER — CAPLYTA 42 MG PO CAPS
42.0000 mg | ORAL_CAPSULE | Freq: Every day | ORAL | 2 refills | Status: DC
  Filled 2024-03-18: qty 30, 30d supply, fill #0

## 2024-03-19 ENCOUNTER — Encounter: Payer: Self-pay | Admitting: Lab

## 2024-03-20 ENCOUNTER — Other Ambulatory Visit (HOSPITAL_COMMUNITY): Payer: Self-pay

## 2024-03-21 ENCOUNTER — Other Ambulatory Visit (HOSPITAL_COMMUNITY): Payer: Self-pay

## 2024-03-23 ENCOUNTER — Encounter: Payer: Self-pay | Admitting: Dermatology

## 2024-03-23 ENCOUNTER — Other Ambulatory Visit (HOSPITAL_COMMUNITY): Payer: Self-pay

## 2024-03-23 ENCOUNTER — Encounter: Payer: Self-pay | Admitting: Family

## 2024-03-23 ENCOUNTER — Ambulatory Visit: Admitting: Dermatology

## 2024-03-23 VITALS — BP 104/76

## 2024-03-23 DIAGNOSIS — F319 Bipolar disorder, unspecified: Secondary | ICD-10-CM | POA: Diagnosis not present

## 2024-03-23 DIAGNOSIS — N939 Abnormal uterine and vaginal bleeding, unspecified: Secondary | ICD-10-CM | POA: Insufficient documentation

## 2024-03-23 DIAGNOSIS — F419 Anxiety disorder, unspecified: Secondary | ICD-10-CM | POA: Diagnosis not present

## 2024-03-23 DIAGNOSIS — J3489 Other specified disorders of nose and nasal sinuses: Secondary | ICD-10-CM | POA: Diagnosis not present

## 2024-03-23 DIAGNOSIS — L603 Nail dystrophy: Secondary | ICD-10-CM | POA: Diagnosis not present

## 2024-03-23 DIAGNOSIS — L910 Hypertrophic scar: Secondary | ICD-10-CM

## 2024-03-23 DIAGNOSIS — G90A Postural orthostatic tachycardia syndrome (POTS): Secondary | ICD-10-CM | POA: Diagnosis not present

## 2024-03-23 DIAGNOSIS — J310 Chronic rhinitis: Secondary | ICD-10-CM | POA: Diagnosis not present

## 2024-03-23 DIAGNOSIS — L680 Hirsutism: Secondary | ICD-10-CM | POA: Diagnosis not present

## 2024-03-23 DIAGNOSIS — Q796 Ehlers-Danlos syndrome, unspecified: Secondary | ICD-10-CM | POA: Diagnosis not present

## 2024-03-23 DIAGNOSIS — G43809 Other migraine, not intractable, without status migrainosus: Secondary | ICD-10-CM | POA: Diagnosis not present

## 2024-03-23 DIAGNOSIS — R32 Unspecified urinary incontinence: Secondary | ICD-10-CM | POA: Insufficient documentation

## 2024-03-23 NOTE — Patient Instructions (Addendum)
 VISIT SUMMARY:  During today's visit, we discussed the follow-up on your scar from a mole biopsy, toenail issues on your left great toe, and hirsutism. We reviewed your current treatments and provided additional recommendations to help manage these conditions.  YOUR PLAN:  -KELOID/HYPERTROPHIC SCAR AT PRIOR BIOPSY SITE:  A keloid is a type of raised scar that can develop where the skin has healed after an injury.  Your keloid is improving but still feels lumpy. Continue massaging the scar with Strataderm daily to improve its texture and flatten it. We provided a coupon for Strataderm to help you continue this treatment.  -ONYCHODYSTROPHY, LEFT GREAT TOE: Onychodystrophy refers to abnormal nail growth, often due to trauma. Your left great toe has experienced repeated injuries, leading to abnormal nail growth.   Apply Vaseline to the nail area to keep it moisturized and consider taking collagen supplements to support nail growth. Monitor the nail for any changes, especially dark streaks, and take photos for future comparison.  -HIRSUTISM:  Hirsutism is excessive hair growth in areas where hair is usually minimal or absent, often due to hormonal factors.  Consider safe hair removal options like laser hair removal with ND:YAG laser, which is safe for brown skin, or electric facial trimmers for temporary removal. We also recommend consulting with an endocrinologist to further evaluate hormonal influences on your hirsutism.  INSTRUCTIONS:  Please continue using Strataderm daily on your scar and apply Vaseline to your left great toe nail area. Consider taking collagen supplements and monitor your nail for any changes. For hirsutism, explore safe hair removal options and consider an endocrinology consultation for further evaluation. Follow up with us  if you have any concerns or notice any significant changes.         Hair Removal laser - Nd:YAG laser   Important Information  Due to recent  changes in healthcare laws, you may see results of your pathology and/or laboratory studies on MyChart before the doctors have had a chance to review them. We understand that in some cases there may be results that are confusing or concerning to you. Please understand that not all results are received at the same time and often the doctors may need to interpret multiple results in order to provide you with the best plan of care or course of treatment. Therefore, we ask that you please give us  2 business days to thoroughly review all your results before contacting the office for clarification. Should we see a critical lab result, you will be contacted sooner.   If You Need Anything After Your Visit  If you have any questions or concerns for your doctor, please call our main line at [phone] If no one answers, please leave a voicemail as directed and we will return your call as soon as possible. Messages left after 4 pm will be answered the following business day.   You may also send us  a message via MyChart. We typically respond to MyChart messages within 1-2 business days.  For prescription refills, please ask your pharmacy to contact our office. Our fax number is [phone].  If you have an urgent issue when the clinic is closed that cannot wait until the next business day, you can page your doctor at the number below.    Please note that while we do our best to be available for urgent issues outside of office hours, we are not available 24/7.   If you have an urgent issue and are unable to reach us , you may choose  to seek medical care at your doctor's office, retail clinic, urgent care center, or emergency room.  If you have a medical emergency, please immediately call 911 or go to the emergency department. In the event of inclement weather, please call our main line at [phone] for an update on the status of any delays or closures.  Dermatology Medication Tips: Please keep the boxes  that topical medications come in in order to help keep track of the instructions about where and how to use these. Pharmacies typically print the medication instructions only on the boxes and not directly on the medication tubes.   If your medication is too expensive, please contact our office at [phone] or send us  a message through MyChart.   We are unable to tell what your co-pay for medications will be in advance as this is different depending on your insurance coverage. However, we may be able to find a substitute medication at lower cost or fill out paperwork to get insurance to cover a needed medication.   If a prior authorization is required to get your medication covered by your insurance company, please allow us  1-2 business days to complete this process.  Drug prices often vary depending on where the prescription is filled and some pharmacies may offer cheaper prices.  The website www.goodrx.com contains coupons for medications through different pharmacies. The prices here do not account for what the cost may be with help from insurance (it may be cheaper with your insurance), but the website can give you the price if you did not use any insurance.  - You can print the associated coupon and take it with your prescription to the pharmacy.  - You may also stop by our office during regular business hours and pick up a GoodRx coupon card.  - If you need your prescription sent electronically to a different pharmacy, notify our office through [facility] or by phone at [phone]

## 2024-03-23 NOTE — Progress Notes (Signed)
   Follow-Up Visit   Subjective  Amanda Gill is a 28 y.o. female established patient who presents for FOLLOW UP on the diagnoses listed below:  Patient was last evaluated on 09/20/23.   Scar: Pt has Bx done in January 2025 of L breast that proved a moderately abnormal more that was removed. Pt has presented today to monitor for re-pigment. Pt stated that she used the StratDerm but stated it was costly even with using coupon.   Are you nursing, pregnant or trying to conceive? No   The following portions of the chart were reviewed this encounter and updated as appropriate: medications, allergies, medical history  Review of Systems:  No other skin or systemic complaints except as noted in HPI or Assessment and Plan.  Objective  Well appearing patient in no apparent distress; mood and affect are within normal limits.   A focused examination was performed of the following areas: L breast   Relevant exam findings are noted in the Assessment and Plan.    Assessment & Plan   Keloid/hypertrophic scar at prior biopsy site The keloid is improving but remains slightly lumpy. She prefers a smoother texture. Intralesional Kenalog  is not recommended due to her history of adrenal suppression with corticosteroids, despite its efficacy in treating keloids. The risk of systemic absorption, although low, is not worth the potential complications. - Continue massaging the scar with Strataderm daily to improve texture and flatten the scar. - Provide a coupon for Strataderm to facilitate continued use.  Onychodystrophy, left great toe The left great toe exhibits onychodystrophy, likely due to chronic trauma and repeated injuries. Previous biopsies have shown no fungal infection, but chronic trauma and possible post-inflammatory hyperpigmentation. The nail is expected to grow back abnormally due to potential damage to the stem cells behind the cuticle. - Apply Vaseline to the nail area to maintain  moisture and support healthy nail growth. - Consider taking collagen supplements to potentially accelerate nail growth. - Monitor the nail for any changes, particularly dark streaks, and document with photographs for future comparison.  Hirsutism Hirsutism is potentially hormonally related. Hormone levels are often normal, and sensitivity of hair receptors to hormones is genetically determined. She has hypothalamic-pituitary-adrenal axis dysfunction, complicating hormonal treatment options. - Consider safe hair removal options such as laser hair removal with ND:YAG laser, which is safe for brown skin, or electric facial trimmers for temporary removal. - Explore endocrinology consultation for further evaluation of hormonal influences on hirsutism.   No follow-ups on file.   Documentation: I have reviewed the above documentation for accuracy and completeness, and I agree with the above.  I, Shirron Maranda, CMA, am acting as scribe for [employer], DO.   Delon Lenis, DO

## 2024-03-24 DIAGNOSIS — F429 Obsessive-compulsive disorder, unspecified: Secondary | ICD-10-CM | POA: Diagnosis not present

## 2024-03-24 DIAGNOSIS — F3175 Bipolar disorder, in partial remission, most recent episode depressed: Secondary | ICD-10-CM | POA: Diagnosis not present

## 2024-03-24 DIAGNOSIS — M62838 Other muscle spasm: Secondary | ICD-10-CM | POA: Diagnosis not present

## 2024-03-24 DIAGNOSIS — R1023 Pelvic and perineal pain bilateral: Secondary | ICD-10-CM | POA: Diagnosis not present

## 2024-03-24 DIAGNOSIS — F902 Attention-deficit hyperactivity disorder, combined type: Secondary | ICD-10-CM | POA: Diagnosis not present

## 2024-03-24 DIAGNOSIS — F4312 Post-traumatic stress disorder, chronic: Secondary | ICD-10-CM | POA: Diagnosis not present

## 2024-03-24 DIAGNOSIS — M6289 Other specified disorders of muscle: Secondary | ICD-10-CM | POA: Diagnosis not present

## 2024-03-24 DIAGNOSIS — M6281 Muscle weakness (generalized): Secondary | ICD-10-CM | POA: Diagnosis not present

## 2024-03-24 DIAGNOSIS — N3946 Mixed incontinence: Secondary | ICD-10-CM | POA: Diagnosis not present

## 2024-03-24 DIAGNOSIS — F840 Autistic disorder: Secondary | ICD-10-CM | POA: Diagnosis not present

## 2024-03-25 ENCOUNTER — Encounter: Payer: Self-pay | Admitting: Podiatry

## 2024-03-25 DIAGNOSIS — R1011 Right upper quadrant pain: Secondary | ICD-10-CM | POA: Diagnosis not present

## 2024-03-25 DIAGNOSIS — R10A1 Flank pain, right side: Secondary | ICD-10-CM | POA: Diagnosis not present

## 2024-03-25 DIAGNOSIS — N809 Endometriosis, unspecified: Secondary | ICD-10-CM | POA: Diagnosis not present

## 2024-03-30 ENCOUNTER — Encounter: Payer: Self-pay | Admitting: Family

## 2024-03-30 ENCOUNTER — Other Ambulatory Visit (HOSPITAL_COMMUNITY): Payer: Self-pay

## 2024-04-01 DIAGNOSIS — F840 Autistic disorder: Secondary | ICD-10-CM | POA: Diagnosis not present

## 2024-04-01 DIAGNOSIS — F3175 Bipolar disorder, in partial remission, most recent episode depressed: Secondary | ICD-10-CM | POA: Diagnosis not present

## 2024-04-01 DIAGNOSIS — F429 Obsessive-compulsive disorder, unspecified: Secondary | ICD-10-CM | POA: Diagnosis not present

## 2024-04-01 DIAGNOSIS — F902 Attention-deficit hyperactivity disorder, combined type: Secondary | ICD-10-CM | POA: Diagnosis not present

## 2024-04-01 DIAGNOSIS — F4312 Post-traumatic stress disorder, chronic: Secondary | ICD-10-CM | POA: Diagnosis not present

## 2024-04-02 ENCOUNTER — Other Ambulatory Visit (HOSPITAL_COMMUNITY): Payer: Self-pay

## 2024-04-02 DIAGNOSIS — R790 Abnormal level of blood mineral: Secondary | ICD-10-CM | POA: Diagnosis not present

## 2024-04-02 DIAGNOSIS — G479 Sleep disorder, unspecified: Secondary | ICD-10-CM | POA: Diagnosis not present

## 2024-04-02 DIAGNOSIS — M2550 Pain in unspecified joint: Secondary | ICD-10-CM | POA: Diagnosis not present

## 2024-04-03 ENCOUNTER — Encounter: Payer: Self-pay | Admitting: Family

## 2024-04-03 DIAGNOSIS — G8929 Other chronic pain: Secondary | ICD-10-CM | POA: Diagnosis not present

## 2024-04-03 DIAGNOSIS — M25511 Pain in right shoulder: Secondary | ICD-10-CM | POA: Diagnosis not present

## 2024-04-06 ENCOUNTER — Other Ambulatory Visit: Payer: Self-pay

## 2024-04-06 ENCOUNTER — Other Ambulatory Visit (HOSPITAL_COMMUNITY): Payer: Self-pay

## 2024-04-06 ENCOUNTER — Encounter: Payer: Self-pay | Admitting: Family

## 2024-04-06 DIAGNOSIS — J310 Chronic rhinitis: Secondary | ICD-10-CM | POA: Diagnosis not present

## 2024-04-06 DIAGNOSIS — H6993 Unspecified Eustachian tube disorder, bilateral: Secondary | ICD-10-CM | POA: Diagnosis not present

## 2024-04-07 DIAGNOSIS — F4312 Post-traumatic stress disorder, chronic: Secondary | ICD-10-CM | POA: Diagnosis not present

## 2024-04-07 DIAGNOSIS — F902 Attention-deficit hyperactivity disorder, combined type: Secondary | ICD-10-CM | POA: Diagnosis not present

## 2024-04-07 DIAGNOSIS — F429 Obsessive-compulsive disorder, unspecified: Secondary | ICD-10-CM | POA: Diagnosis not present

## 2024-04-07 DIAGNOSIS — M549 Dorsalgia, unspecified: Secondary | ICD-10-CM | POA: Diagnosis not present

## 2024-04-07 DIAGNOSIS — M546 Pain in thoracic spine: Secondary | ICD-10-CM | POA: Diagnosis not present

## 2024-04-07 DIAGNOSIS — M5459 Other low back pain: Secondary | ICD-10-CM | POA: Diagnosis not present

## 2024-04-07 DIAGNOSIS — F840 Autistic disorder: Secondary | ICD-10-CM | POA: Diagnosis not present

## 2024-04-07 DIAGNOSIS — F3175 Bipolar disorder, in partial remission, most recent episode depressed: Secondary | ICD-10-CM | POA: Diagnosis not present

## 2024-04-08 DIAGNOSIS — M9905 Segmental and somatic dysfunction of pelvic region: Secondary | ICD-10-CM | POA: Diagnosis not present

## 2024-04-08 DIAGNOSIS — M9900 Segmental and somatic dysfunction of head region: Secondary | ICD-10-CM | POA: Diagnosis not present

## 2024-04-08 DIAGNOSIS — M9904 Segmental and somatic dysfunction of sacral region: Secondary | ICD-10-CM | POA: Diagnosis not present

## 2024-04-08 DIAGNOSIS — M9908 Segmental and somatic dysfunction of rib cage: Secondary | ICD-10-CM | POA: Diagnosis not present

## 2024-04-08 DIAGNOSIS — M9901 Segmental and somatic dysfunction of cervical region: Secondary | ICD-10-CM | POA: Diagnosis not present

## 2024-04-08 DIAGNOSIS — M9906 Segmental and somatic dysfunction of lower extremity: Secondary | ICD-10-CM | POA: Diagnosis not present

## 2024-04-08 DIAGNOSIS — M9903 Segmental and somatic dysfunction of lumbar region: Secondary | ICD-10-CM | POA: Diagnosis not present

## 2024-04-08 DIAGNOSIS — M9907 Segmental and somatic dysfunction of upper extremity: Secondary | ICD-10-CM | POA: Diagnosis not present

## 2024-04-08 DIAGNOSIS — M62838 Other muscle spasm: Secondary | ICD-10-CM | POA: Diagnosis not present

## 2024-04-08 DIAGNOSIS — M9902 Segmental and somatic dysfunction of thoracic region: Secondary | ICD-10-CM | POA: Diagnosis not present

## 2024-04-09 ENCOUNTER — Encounter: Payer: Self-pay | Admitting: Family

## 2024-04-09 ENCOUNTER — Other Ambulatory Visit (HOSPITAL_COMMUNITY): Payer: Self-pay

## 2024-04-09 DIAGNOSIS — R21 Rash and other nonspecific skin eruption: Secondary | ICD-10-CM | POA: Diagnosis not present

## 2024-04-10 ENCOUNTER — Other Ambulatory Visit (HOSPITAL_COMMUNITY): Payer: Self-pay

## 2024-04-10 ENCOUNTER — Ambulatory Visit: Admitting: Podiatry

## 2024-04-13 ENCOUNTER — Other Ambulatory Visit (HOSPITAL_COMMUNITY): Payer: Self-pay

## 2024-04-13 DIAGNOSIS — G588 Other specified mononeuropathies: Secondary | ICD-10-CM | POA: Diagnosis not present

## 2024-04-14 ENCOUNTER — Other Ambulatory Visit (HOSPITAL_COMMUNITY): Payer: Self-pay

## 2024-04-14 ENCOUNTER — Other Ambulatory Visit: Payer: Self-pay

## 2024-04-14 DIAGNOSIS — F4312 Post-traumatic stress disorder, chronic: Secondary | ICD-10-CM | POA: Diagnosis not present

## 2024-04-14 DIAGNOSIS — F429 Obsessive-compulsive disorder, unspecified: Secondary | ICD-10-CM | POA: Diagnosis not present

## 2024-04-14 DIAGNOSIS — F840 Autistic disorder: Secondary | ICD-10-CM | POA: Diagnosis not present

## 2024-04-14 DIAGNOSIS — F3175 Bipolar disorder, in partial remission, most recent episode depressed: Secondary | ICD-10-CM | POA: Diagnosis not present

## 2024-04-14 DIAGNOSIS — F902 Attention-deficit hyperactivity disorder, combined type: Secondary | ICD-10-CM | POA: Diagnosis not present

## 2024-04-14 MED ORDER — TACROLIMUS 0.03 % EX OINT
TOPICAL_OINTMENT | CUTANEOUS | 1 refills | Status: AC
  Filled 2024-04-14: qty 60, 30d supply, fill #0
  Filled 2024-05-08: qty 60, 30d supply, fill #1

## 2024-04-15 ENCOUNTER — Other Ambulatory Visit (HOSPITAL_COMMUNITY): Payer: Self-pay

## 2024-04-15 DIAGNOSIS — M549 Dorsalgia, unspecified: Secondary | ICD-10-CM | POA: Diagnosis not present

## 2024-04-15 DIAGNOSIS — M2480 Other specific joint derangements of unspecified joint, not elsewhere classified: Secondary | ICD-10-CM | POA: Diagnosis not present

## 2024-04-15 DIAGNOSIS — M5459 Other low back pain: Secondary | ICD-10-CM | POA: Diagnosis not present

## 2024-04-15 DIAGNOSIS — G8929 Other chronic pain: Secondary | ICD-10-CM | POA: Diagnosis not present

## 2024-04-15 DIAGNOSIS — M25551 Pain in right hip: Secondary | ICD-10-CM | POA: Diagnosis not present

## 2024-04-15 DIAGNOSIS — M546 Pain in thoracic spine: Secondary | ICD-10-CM | POA: Diagnosis not present

## 2024-04-16 ENCOUNTER — Ambulatory Visit
Admission: RE | Admit: 2024-04-16 | Discharge: 2024-04-16 | Disposition: A | Discharge status: Home / Self Care | Source: Ambulatory Visit

## 2024-04-16 ENCOUNTER — Ambulatory Visit

## 2024-04-16 ENCOUNTER — Encounter: Payer: Self-pay | Admitting: Family

## 2024-04-16 VITALS — BP 108/73 | HR 69 | Temp 97.8°F | Resp 12 | Ht 68.75 in | Wt 166.2 lb

## 2024-04-16 DIAGNOSIS — G629 Polyneuropathy, unspecified: Secondary | ICD-10-CM

## 2024-04-16 DIAGNOSIS — M2550 Pain in unspecified joint: Secondary | ICD-10-CM

## 2024-04-16 DIAGNOSIS — E2740 Unspecified adrenocortical insufficiency: Secondary | ICD-10-CM

## 2024-04-16 DIAGNOSIS — M2480 Other specific joint derangements of unspecified joint, not elsewhere classified: Secondary | ICD-10-CM | POA: Diagnosis not present

## 2024-04-16 DIAGNOSIS — M79641 Pain in right hand: Secondary | ICD-10-CM | POA: Diagnosis not present

## 2024-04-16 DIAGNOSIS — M79642 Pain in left hand: Secondary | ICD-10-CM | POA: Diagnosis not present

## 2024-04-16 NOTE — Progress Notes (Signed)
 Office Visit Note  Patient: Amanda Gill             Date of Birth: 1995-09-16           MRN: [id number]             PCP: Virgil Rosalinda CROME, PA-C Referring: Virgil Earnesteen CROME, PA-C Visit Date: 04/16/2024 Occupation: Data Unavailable  Subjective:  New Patient (Initial Visit) (Joint Pain. Seen Rheum in [place], Last seen in September 2025.)   Discussed the use of AI scribe software for clinical note transcription with the patient, who gave verbal consent to proceed.  History of Present Illness Amanda Gill is a 28 year old female with hypermobility and POTS who presents with multiple unexplained symptoms that she is concerned represent an undiagnosed autoimmune condition.  She has been experiencing a variety of symptoms for a long time without definitive answers and has been seeing multiple specialists, including a rheumatologist. Autonomic concerns include intermittent vision changes, light sensitivity, migraines, and cluster headaches. She has a previous diagnosis of POTS, which worsens after eating, and experiences longstanding neuropathy, numbness, and tingling in her hands and feet. A skin biopsy is scheduled to check for small fiber neuropathy.  She experiences widespread myofascial pain with morning stiffness that improves after about an hour and a half. She has had vocal cord dysfunction, which improved with speech therapy, and has been diagnosed with Raynaud's. She also reports prolonged bleeding episodes and has a history of low ferritin levels, which improved with iron  infusion.  She has experienced unintentional weight loss and early puberty, with her first period at age 28. She has androgenic alopecia and changes in hair texture. She reports cycles of increased thirst, hunger, sweating, fatigue, and dryness of skin, eyes, and mouth. She has a known steroid hypersensitivity and a slight elevation in thyroid  antibodies.  Gastrointestinal symptoms include chronic  nausea, gas, bloating, early satiety, and cyclic changes in stool diameter. She reports persistent abdominal pain that starts in the neck and mid-back, wrapping around to the right side and into the hip and groin area. She has a history of pelvic floor dysfunction and was diagnosed with endometriosis, requiring extensive surgery.  Dermatological concerns include atypical mole bleeding, pale stretch marks, skin fragility, and dryness. She has been told she has vasomotor rhinitis and slight nasal congestion without significant findings on allergy testing.  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     Activities of Daily Living:  Patient reports morning stiffness for 90 minutes.   Patient Reports nocturnal pain.  Difficulty dressing/grooming: Denies Difficulty climbing stairs: Denies Difficulty getting out of chair: Denies Difficulty using hands for taps, buttons, cutlery, and/or writing: Denies  Review of Systems  Constitutional:  Positive for fatigue.  HENT:  Positive for mouth dryness. Negative for mouth sores.   Eyes:  Positive for dryness.  Respiratory:  Negative for shortness of breath.   Cardiovascular:  Negative for chest pain and palpitations.  Gastrointestinal:  Positive for constipation. Negative for blood in stool and diarrhea.  Endocrine: Negative for increased urination.  Genitourinary:  Positive for involuntary urination.  Musculoskeletal:  Positive for joint pain, joint pain, myalgias, muscle weakness, morning stiffness, muscle tenderness and myalgias. Negative for gait problem and joint swelling.  Skin:  Positive for color change, hair loss and sensitivity to sunlight. Negative for rash.  Allergic/Immunologic: Negative for susceptible to infections.  Neurological:  Positive for dizziness and headaches.  Hematological:  Negative for swollen glands.  Psychiatric/Behavioral:  Positive for sleep disturbance. Negative for depressed mood. The  patient is not nervous/anxious.     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   Gastroesophageal reflux disease without esophagitis 06/27/2023   Vegan diet 06/27/2023   Endometriosis 05/20/2023   Hypertrophy of inferior nasal turbinate 05/10/2023   Myofascial pain 04/30/2023   Abnormal cortisol level 02/28/2023   Elevated LDL cholesterol level 02/28/2023   History of anemia 02/28/2023   Myalgia 02/05/2023   Esophageal dysphagia 02/05/2023   Hamstring tightness of right lower extremity 01/22/2023   Incontinence without sensory awareness 01/22/2023   Arthralgia 01/17/2023   History of asthma 12/27/2022   Inducible laryngeal obstruction (ILO) 12/27/2022   Asthma 10/04/2022   Hypoglycemia 10/04/2022   Other specified postprocedural states 08/09/2022   Gait disturbance 07/30/2022   Stiffness of right hip, not elsewhere classified 07/30/2022   Weakness 07/30/2022   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   Symptomatic mammary hypertrophy 10/21/2020   Back pain 10/21/2020   Neck pain 10/21/2020   Raynaud's phenomenon 07/08/2020   History of hip surgery 06/06/2019   Acute laryngitis 07/23/2018   Acute maxillary sinusitis 07/23/2018   Fever 07/23/2018   Sore throat 07/23/2018   Allergic reaction to chemical substance 05/20/2018   Bipolar 1 disorder (HCC) 05/20/2018   Anxiety 12/06/2017   Cardiac arrhythmia 12/06/2017   Tachycardia 12/06/2017   Depression 04/26/2017   Allergic rhinitis 09/26/2010    Past Medical History:  Diagnosis Date   Abnormal uterine bleeding    Acute hip pain, left    Acute laryngitis    ADHD    Allergy    Anemia 08/27/22   Iron  and ferritin continuing to improve   Anxiety     Arthralgia    Asthma    Back pain    Cardiac arrhythmia    Chronic bilateral low back pain with right-sided sciatica    Chronic pain of both knees    Congenital dysplasia of hip    Elevated LDL cholesterol level    Endometriosis    Esophageal dysphagia    Gait disturbance    GERD (gastroesophageal reflux disease)    Globus sensation    Hamstring tightness of right lower extremity    Hip weakness    History of tachycardia    Hypertrophy of inferior nasal turbinate    Hypoglycemia    Irritable larynx    Labral tear of right hip joint    Low ferritin    Lumbar radiculopathy    Muscle tension dysphonia    Muscular incoordination    Myalgia    Nasal septal deviation    Neck pain    Pharyngeal dysphagia    Raynaud's phenomenon    Restless leg syndrome    Unintentional weight loss    Urge urinary incontinence    Urinary incontinence    Vitamin D deficiency    Vocal cord dysfunction    Wound of right breast     Family History  Problem Relation Age of Onset   Diabetes Mother    Osteoporosis Mother    Diabetes Maternal Grandfather    Heart disease Maternal Grandfather    Hyperlipidemia Maternal Grandfather    Diabetes Paternal  Grandmother    Hyperlipidemia Paternal Grandmother    Past Surgical History:  Procedure Laterality Date   DENTAL SURGERY     HIP ARTHROSCOPY Left    HIP SURGERY Bilateral    LAPAROSCOPY     Endometrosis   UPPER GASTROINTESTINAL ENDOSCOPY  2024   Social History   Tobacco Use   Smoking status: Never    Passive exposure: Never   Smokeless tobacco: Never  Vaping Use   Vaping status: Never Used  Substance Use Topics   Alcohol use: No   Drug use: No   Social History   Social History Narrative   Not on file     Immunization History  Administered Date(s) Administered   DTaP 04/12/1998, 06/13/1998, 08/15/1998, 07/25/1999, 09/15/2002   Dtap, Unspecified 04/12/1998, 06/13/1998, 08/15/1998, 07/25/1999, 09/15/2002   Fluzone Influenza  virus vaccine,trivalent (IIV3), split virus 03/28/2004, 04/24/2004, 03/18/2013, 04/04/2022   HIB, Unspecified 04/12/1998, 06/13/1998, 08/15/1998, 07/25/1999   HPV Quadrivalent 09/11/2012, 11/11/2012, 02/25/2013, 03/18/2013   Hep B, Unspecified 04/12/1998, 06/13/1998, 07/27/1998, 11/14/1998   Hepatitis A, Adult 08/26/2012   Hepatitis A, Ped/Adol-2 Dose 01/08/2012, 09/11/2012   Hepatitis B, PED/ADOLESCENT 04/12/1998, 06/13/1998, 11/14/1998   IPV 04/12/1998, 06/13/1998, 07/25/1999, 09/15/2002   Influenza Inj Mdck Quad Pf 04/04/2022   Influenza Nasal 04/02/2006, 03/18/2007, 03/08/2008, 03/29/2009, 03/21/2010, 03/28/2011   Influenza, Mdck, Trivalent,PF 6+ MOS(egg free) 02/07/2023   Influenza, Seasonal, Injecte, Preservative Fre 03/13/2024   Influenza,inj,Quad PF,6+ Mos 04/05/2017, 04/18/2018, 02/28/2019   Influenza,inj,quad, With Preservative 02/17/2014, 04/05/2015   Influenza-Unspecified 03/28/2004, 04/24/2004, 02/25/2017, 03/08/2017, 04/08/2017, 03/28/2018, 04/12/2018   MMR 04/25/1999, 08/27/2002, 09/15/2002   Meningococcal Acwy, Unspecified 03/29/2015   Meningococcal Conjugate 01/08/2012, 04/20/2015   PFIZER(Purple Top)SARS-COV-2 Vaccination 08/21/2019, 09/15/2019, 05/03/2020   Pfizer Covid-19 Vaccine Bivalent Booster 51yrs & up 03/24/2021   Pfizer(Comirnaty)Fall Seasonal Vaccine 12 years and older 04/04/2022, 02/11/2024   Pneumococcal Conjugate,unspecified 11/14/1998   Pneumococcal-Unspecified 10/27/1998, 11/14/1998   Polio, Unspecified 04/12/1998, 06/13/1998, 07/25/1999, 09/15/2002   Tdap 12/31/2008, 05/03/2017, 11/24/2020   Varicella 04/25/1999, 12/31/2008     Objective: Vital Signs: BP 108/73 (BP Location: Right Arm, Patient Position: Sitting, Cuff Size: Small)   Pulse 69   Temp 97.8 F (36.6 C)   Resp 12   Ht 5' 8.75 (1.746 m)   Wt 166 lb 3.2 oz (75.4 kg)   LMP 04/08/2024   BMI 24.72 kg/m    Physical Exam Vitals and nursing note reviewed.  HENT:     Head: Normocephalic  and atraumatic.     Nose: Nose normal.  Eyes:     Conjunctiva/sclera: Conjunctivae normal.     Pupils: Pupils are equal, round, and reactive to light.  Cardiovascular:     Rate and Rhythm: Normal rate and regular rhythm.     Heart sounds: Normal heart sounds.  Pulmonary:     Effort: Pulmonary effort is normal.     Breath sounds: Normal breath sounds.  Skin:    General: Skin is warm and dry.  Neurological:     Mental Status: She is alert. Mental status is at baseline.  Psychiatric:        Mood and Affect: Mood normal.        Behavior: Behavior normal.      Musculoskeletal Exam:   CDAI Exam: CDAI Score: -- Patient Global: --; Provider Global: -- Swollen: 0 ; Tender: 3  Joint Exam 04/16/2024      Right  Left  Elbow      Tender  MCP 2   Tender  PIP 3 (finger)   Tender        Investigation: No additional findings.  Imaging: No results found.  Recent Labs: Lab Results  Component Value Date   WBC 4.2 12/18/2023   HGB 12.8 12/18/2023   PLT 254 12/18/2023   NA 142 07/17/2023   K 4.1 07/17/2023   CL 109 07/17/2023   CO2 27 07/17/2023   GLUCOSE 65 (L) 07/17/2023   BUN 11 07/17/2023   CREATININE 0.81 07/17/2023   BILITOT 1.1 07/17/2023   ALKPHOS 46 07/17/2023   AST 12 (L) 07/17/2023   ALT 7 07/17/2023   PROT 6.7 07/17/2023   ALBUMIN 4.3 07/17/2023   CALCIUM 9.7 07/17/2023   GFRAA NOT CALCULATED 12/06/2013    Speciality Comments: No specialty comments available.  Procedures:  No procedures performed Allergies: Chlorhexidine, Iodine, Lactose, Cortisone, Lactose intolerance (gi), Milk (cow), and Triamcinolone  acetonide   Assessment / Plan:     Visit Diagnoses:  Polyarthralgia Generalized hypermobility of joints EDS? Patient with hx of joint pain that appears non-inflammatory in nature and evidence of hypermobility on exam. At this time, suspect patient's joint symptoms are secondary to generalized hypermobility syndrome, as well as secondary to prior  injuries.   Discussed joint hypermobility is not an autoimmune disease, and that we do not have any medications to treat the condition. Discussed supportive care including physical therapy for joint strengthening/stabilization, with referral for physical therapy placed at this encounter. Also discussed with patient that medications including NSAIDs and acetaminophen  can be used as needed for joint symptoms.  Neuropathy  Patient with diffuse numbness/tingling of extremities and other areas of her body of unclear etiology. Agree with skin biopsy to rule out small fiber neuropathy. Discussed with patient that normal EMG was reassuring to rule out most neuropathies associated with rheumatologic conditions.  Patient concerned given abnormal protein levels in her blood and her neuropathy, so will obtain IFE Interpretation, Kappa/lambda light chains, Angiotensin converting enzyme, Sedimentation rate, C-reactive protein today. Will also rule out immunodeficiency with IgG, IgA, IgM and C3/C4.  Polyarthralgia  Patient with diffuse joint and muscle pain, likely secondary to hypermobility as discussed above. Will obtain ANA, DG Hand 2 View Right, DG Hand 2 View Left for evaluation.  Adrenal insufficiency Hypercortisolism Patient with hx of multiple endocrine related symptoms and abnormal test results. She also reports hx of adrenal insufficiency. Ambulatory referral to Endocrinology placed at this encounter.  Orders: Orders Placed This Encounter  Procedures   DG Hand 2 View Right   DG Hand 2 View Left   IFE Interpretation   Kappa/lambda light chains   Angiotensin converting enzyme   Sedimentation rate   C-reactive protein   IgG, IgA, IgM   ANA   C3 and C4   AMB referral to sports medicine   Ambulatory referral to Endocrinology   No orders of the defined types were placed in this encounter.   I personally spent a total of 60 minutes in the care of the patient today including preparing to see  the patient, getting/reviewing separately obtained history, performing a medically appropriate exam/evaluation, counseling and educating, placing orders, referring and communicating with other health care professionals, documenting clinical information in the EHR, and independently interpreting results.   Follow-Up Instructions: Return in about 3 months (around 07/17/2024).   Asberry Claw, DO  Note - This record has been created using Animal nutritionist.  Chart creation errors have been sought, but may not always  have been located. Such creation errors do not  reflect on  the standard of medical care.

## 2024-04-17 DIAGNOSIS — M9901 Segmental and somatic dysfunction of cervical region: Secondary | ICD-10-CM | POA: Diagnosis not present

## 2024-04-17 DIAGNOSIS — M9908 Segmental and somatic dysfunction of rib cage: Secondary | ICD-10-CM | POA: Diagnosis not present

## 2024-04-17 DIAGNOSIS — M9903 Segmental and somatic dysfunction of lumbar region: Secondary | ICD-10-CM | POA: Diagnosis not present

## 2024-04-17 DIAGNOSIS — M9900 Segmental and somatic dysfunction of head region: Secondary | ICD-10-CM | POA: Diagnosis not present

## 2024-04-17 DIAGNOSIS — M62838 Other muscle spasm: Secondary | ICD-10-CM | POA: Diagnosis not present

## 2024-04-17 DIAGNOSIS — M9904 Segmental and somatic dysfunction of sacral region: Secondary | ICD-10-CM | POA: Diagnosis not present

## 2024-04-17 DIAGNOSIS — M9905 Segmental and somatic dysfunction of pelvic region: Secondary | ICD-10-CM | POA: Diagnosis not present

## 2024-04-17 DIAGNOSIS — M9907 Segmental and somatic dysfunction of upper extremity: Secondary | ICD-10-CM | POA: Diagnosis not present

## 2024-04-17 DIAGNOSIS — M9906 Segmental and somatic dysfunction of lower extremity: Secondary | ICD-10-CM | POA: Diagnosis not present

## 2024-04-17 DIAGNOSIS — M9902 Segmental and somatic dysfunction of thoracic region: Secondary | ICD-10-CM | POA: Diagnosis not present

## 2024-04-20 DIAGNOSIS — R790 Abnormal level of blood mineral: Secondary | ICD-10-CM | POA: Diagnosis not present

## 2024-04-20 DIAGNOSIS — Z0110 Encounter for examination of ears and hearing without abnormal findings: Secondary | ICD-10-CM | POA: Diagnosis not present

## 2024-04-20 DIAGNOSIS — Z8639 Personal history of other endocrine, nutritional and metabolic disease: Secondary | ICD-10-CM | POA: Diagnosis not present

## 2024-04-20 DIAGNOSIS — R42 Dizziness and giddiness: Secondary | ICD-10-CM | POA: Diagnosis not present

## 2024-04-20 DIAGNOSIS — D72819 Decreased white blood cell count, unspecified: Secondary | ICD-10-CM | POA: Diagnosis not present

## 2024-04-20 DIAGNOSIS — R7989 Other specified abnormal findings of blood chemistry: Secondary | ICD-10-CM | POA: Diagnosis not present

## 2024-04-21 DIAGNOSIS — M546 Pain in thoracic spine: Secondary | ICD-10-CM | POA: Diagnosis not present

## 2024-04-21 DIAGNOSIS — F902 Attention-deficit hyperactivity disorder, combined type: Secondary | ICD-10-CM | POA: Diagnosis not present

## 2024-04-21 DIAGNOSIS — F429 Obsessive-compulsive disorder, unspecified: Secondary | ICD-10-CM | POA: Diagnosis not present

## 2024-04-21 DIAGNOSIS — M5459 Other low back pain: Secondary | ICD-10-CM | POA: Diagnosis not present

## 2024-04-21 DIAGNOSIS — F4312 Post-traumatic stress disorder, chronic: Secondary | ICD-10-CM | POA: Diagnosis not present

## 2024-04-21 DIAGNOSIS — M549 Dorsalgia, unspecified: Secondary | ICD-10-CM | POA: Diagnosis not present

## 2024-04-21 DIAGNOSIS — F3175 Bipolar disorder, in partial remission, most recent episode depressed: Secondary | ICD-10-CM | POA: Diagnosis not present

## 2024-04-21 DIAGNOSIS — F840 Autistic disorder: Secondary | ICD-10-CM | POA: Diagnosis not present

## 2024-04-21 LAB — KAPPA/LAMBDA LIGHT CHAINS
Kappa free light chain: 15 mg/L (ref 3.3–19.4)
Kappa:Lambda Ratio: 1.67 — ABNORMAL HIGH (ref 0.26–1.65)
Lambda Free Lght Chn: 9 mg/L (ref 5.7–26.3)

## 2024-04-21 LAB — IGG, IGA, IGM
IgG (Immunoglobin G), Serum: 1221 mg/dL (ref 600–1640)
IgM, Serum: 136 mg/dL (ref 50–300)
Immunoglobulin A: 86 mg/dL (ref 47–310)

## 2024-04-21 LAB — ANGIOTENSIN CONVERTING ENZYME: Angiotensin-Converting Enzyme: 30 U/L (ref 9–67)

## 2024-04-21 LAB — IFE INTERPRETATION

## 2024-04-21 LAB — C3 AND C4
C3 Complement: 101 mg/dL (ref 83–193)
C4 Complement: 14 mg/dL — ABNORMAL LOW (ref 15–57)

## 2024-04-21 LAB — C-REACTIVE PROTEIN: CRP: <3 mg/L (ref <8.0)

## 2024-04-21 LAB — SEDIMENTATION RATE: Sed Rate: 2 mm/h (ref 0–20)

## 2024-04-21 LAB — ANA: Anti Nuclear Antibody (ANA): NEGATIVE

## 2024-04-22 ENCOUNTER — Inpatient Hospital Stay

## 2024-04-22 ENCOUNTER — Inpatient Hospital Stay: Admitting: Family

## 2024-04-22 ENCOUNTER — Inpatient Hospital Stay: Attending: Hematology & Oncology

## 2024-04-22 VITALS — BP 110/73 | HR 80 | Resp 16 | Wt 165.0 lb

## 2024-04-22 DIAGNOSIS — N922 Excessive menstruation at puberty: Secondary | ICD-10-CM

## 2024-04-22 DIAGNOSIS — E611 Iron deficiency: Secondary | ICD-10-CM | POA: Diagnosis not present

## 2024-04-22 DIAGNOSIS — D509 Iron deficiency anemia, unspecified: Secondary | ICD-10-CM

## 2024-04-22 LAB — RETICULOCYTES
Immature Retic Fract: 5 % (ref 2.3–15.9)
RBC.: 4.54 MIL/uL (ref 3.87–5.11)
Retic Count, Absolute: 70.8 K/uL (ref 19.0–186.0)
Retic Ct Pct: 1.6 % (ref 0.4–3.1)

## 2024-04-22 LAB — CBC WITH DIFFERENTIAL (CANCER CENTER ONLY)
Abs Immature Granulocytes: 0.03 K/uL (ref 0.00–0.07)
Basophils Absolute: 0 K/uL (ref 0.0–0.1)
Basophils Relative: 1 %
Eosinophils Absolute: 0.1 K/uL (ref 0.0–0.5)
Eosinophils Relative: 2 %
HCT: 39.9 % (ref 36.0–46.0)
Hemoglobin: 13.8 g/dL (ref 12.0–15.0)
Immature Granulocytes: 1 %
Lymphocytes Relative: 48 %
Lymphs Abs: 2.3 K/uL (ref 0.7–4.0)
MCH: 30.1 pg (ref 26.0–34.0)
MCHC: 34.6 g/dL (ref 30.0–36.0)
MCV: 86.9 fL (ref 80.0–100.0)
Monocytes Absolute: 0.3 K/uL (ref 0.1–1.0)
Monocytes Relative: 7 %
Neutro Abs: 1.9 K/uL (ref 1.7–7.7)
Neutrophils Relative %: 41 %
Platelet Count: 250 K/uL (ref 150–400)
RBC: 4.59 MIL/uL (ref 3.87–5.11)
RDW: 12.4 % (ref 11.5–15.5)
WBC Count: 4.7 K/uL (ref 4.0–10.5)
nRBC: 0 % (ref 0.0–0.2)

## 2024-04-22 LAB — IRON AND IRON BINDING CAPACITY (CC-WL,HP ONLY)
Iron: 121 ug/dL (ref 28–170)
Saturation Ratios: 39 % — ABNORMAL HIGH (ref 10.4–31.8)
TIBC: 311 ug/dL (ref 250–450)
UIBC: 190 ug/dL

## 2024-04-22 LAB — FERRITIN: Ferritin: 181 ng/mL (ref 11–307)

## 2024-04-22 NOTE — Progress Notes (Signed)
 Hematology and Oncology Follow Up Visit  Amanda Gill [id number] September 02, 1995 28 y.o. 04/22/2024   Principle Diagnosis:  Iron  deficiency without anemia    Current Therapy:        IV iron  as indicated to maintain ferritin > 50   Interim History:  [name] is here today for follow-up. She is doing fairly well.  Her cycle is regular with heavy flow. She has also noted moles that have bled. She would like to have a VWB panel checked.  She experiences chronic fatigue which worsens during and right after her cycle.   Symptoms with POTS seem to be relatively stable at this time. Her symptoms include light sensitivity/headaches, trouble sleeping, dizziness, SOB with over exertion and palpitations. Symptoms seem to worsen after eating.   Neuropathy in the hands and feet unchanged from baseline.  No falls or syncope reported.  Appetite and hydration overall are good. Weight has remained stable at 165 lbs.   ECOG Performance Status: 1 - Symptomatic but completely ambulatory  Medications:  Allergies as of 04/22/2024       Reactions   Chlorhexidine Other (See Comments), Itching, Rash, Shortness Of Breath   With shower prep night before surgery With shower prep night before surgery With shower prep night before surgery   Iodine Shortness Of Breath   Patient reported flushed sensation and shortness of breath following iodine for CT imaging.  No hives or airway swelling.   Lactose Anaphylaxis, Diarrhea   Cortisone Other (See Comments)   Causes flare of bipolar symptoms. Avoid corticosteroids when possible, especially kenalog  Behavioral issues   Lactose Intolerance (gi) Other (See Comments)   throat swells/stomach hurts/constipation   Milk (cow) Other (See Comments)   Triamcinolone  Acetonide    Behavioral issues        Medication List        Accurate as of April 22, 2024  2:45 PM. If you have any questions, ask your nurse or doctor.          STOP taking these  medications    atenolol  25 MG tablet Commonly known as: TENORMIN  Stopped by: Lauraine Pepper   omeprazole  40 MG capsule Commonly known as: PRILOSEC Stopped by: Lauraine Pepper       TAKE these medications    albuterol  108 (90 Base) MCG/ACT inhaler Commonly known as: VENTOLIN  HFA Inhale 1 puff into the lungs every 6 (six) hours as needed.   amphetamine -dextroamphetamine  30 MG 24 hr capsule Commonly known as: ADDERALL XR Take 2 capsules (60 mg total) by mouth every morning.   buPROPion  300 MG 24 hr tablet Commonly known as: WELLBUTRIN  XL Take 1 tablet (300 mg total) by mouth daily. What changed: Another medication with the same name was removed. Continue taking this medication, and follow the directions you see here. Changed by: Lauraine Pepper   Caplyta  42 MG capsule Generic drug: lumateperone  tosylate Take 1 capsule (42 mg total) by mouth daily.   Caplyta  42 MG capsule Generic drug: lumateperone  tosylate Take 1 capsule (42 mg total) by mouth daily.   Caplyta  42 MG capsule Generic drug: lumateperone  tosylate Take 1 capsule (42 mg total) by mouth daily.   Caplyta  42 MG capsule Generic drug: lumateperone  tosylate Take 1 capsule (42 mg total) by mouth daily.   Caplyta  42 MG capsule Generic drug: lumateperone  tosylate Take 1 capsule (42 mg total) by mouth daily.   Caplyta  42 MG capsule Generic drug: lumateperone  tosylate Take 1 capsule (42 mg total) by mouth daily.  clidinium-chlordiazePOXIDE  5-2.5 MG capsule Commonly known as: LIBRAX  Take 1 capsule by mouth 4 (four) times a day as needed (abdominal pain). What changed:  when to take this reasons to take this   cromolyn  100 MG/5ML solution Commonly known as: GASTROCROM  Take 10 mLs (200 mg total) by mouth 4 (four) times daily -  before meals and at bedtime.   doxycycline  100 MG capsule Commonly known as: VIBRAMYCIN  Take 1 twice a day with food for 7 days   Emgality  120 MG/ML Sosy Generic drug:  Galcanezumab -gnlm Take 240 mg (two consecutive subcutaneous injections of 120 mg each) once as a loading dose, then in one month start monthly doses of 120 mg injected subcutaneously.   ibuprofen  200 MG tablet Commonly known as: ADVIL  Take 400 mg by mouth every 6 (six) hours as needed.   imiquimod  5 % cream Commonly known as: Aldara  Apply topically at bedtime.   levonorgestrel  20 MCG/DAY Iud Commonly known as: MIRENA  1 each by Intrauterine route once.   lisdexamfetamine 30 MG capsule Commonly known as: Vyvanse  Take 1 capsule (30 mg total) by mouth 2 (two) times daily.   lisdexamfetamine 30 MG capsule Commonly known as: Vyvanse  Take 1 capsule (30 mg total) by mouth 2 (two) times daily.   lisdexamfetamine 30 MG capsule Commonly known as: Vyvanse  Take 1 capsule (30 mg total) by mouth 2 (two) times daily. Start taking on: April 24, 2024   magnesium  oxide 400 (240 Mg) MG tablet Commonly known as: MAG-OX Take 1 tablet (400 mg total) by mouth at bedtime.   metoprolol  tartrate 25 MG tablet Commonly known as: LOPRESSOR  Take 1 tablet (25 mg total) by mouth 2 (two) times daily.   optichamber diamond Misc Use spacer as directed with inhaler   Qbrexza  2.4 % Pads Generic drug: Glycopyrronium Tosylate  Apply to underarms every night   Safety Seal Miscellaneous Misc Apply 1 application  topically in the morning. Hormonic Hair Solution   tacrolimus  0.03 % ointment Commonly known as: PROTOPIC  Apply topically 2 (two) times a day.        Allergies:  Allergies  Allergen Reactions   Chlorhexidine Other (See Comments), Itching, Rash and Shortness Of Breath    With shower prep night before surgery With shower prep night before surgery With shower prep night before surgery    Iodine Shortness Of Breath    Patient reported flushed sensation and shortness of breath following iodine for CT imaging.  No hives or airway swelling.   Lactose Anaphylaxis and Diarrhea   Cortisone  Other (See Comments)    Causes flare of bipolar symptoms. Avoid corticosteroids when possible, especially kenalog   Behavioral issues   Lactose Intolerance (Gi) Other (See Comments)    throat swells/stomach hurts/constipation   Milk (Cow) Other (See Comments)   Triamcinolone  Acetonide     Behavioral issues    Past Medical History, Surgical history, Social history, and Family History were reviewed and updated.  Review of Systems: All other 10 point review of systems is negative.   Physical Exam:  vitals were not taken for this visit.   Wt Readings from Last 3 Encounters:  04/16/24 166 lb 3.2 oz (75.4 kg)  12/18/23 161 lb (73 kg)  09/16/23 162 lb (73.5 kg)    Ocular: Sclerae unicteric, pupils equal, round and reactive to light Ear-nose-throat: Oropharynx clear, dentition fair Lymphatic: No cervical or supraclavicular adenopathy Lungs no rales or rhonchi, good excursion bilaterally Heart regular rate and rhythm, no murmur appreciated Abd soft, nontender, positive bowel  sounds MSK no focal spinal tenderness, no joint edema Neuro: non-focal, well-oriented, appropriate affect Breasts: Deferred   Lab Results  Component Value Date   WBC 4.7 04/22/2024   HGB 13.8 04/22/2024   HCT 39.9 04/22/2024   MCV 86.9 04/22/2024   PLT 250 04/22/2024   Lab Results  Component Value Date   FERRITIN 132 12/18/2023   IRON  148 12/18/2023   TIBC 319 12/18/2023   UIBC 171 12/18/2023   IRONPCTSAT 46 (H) 12/18/2023   Lab Results  Component Value Date   RETICCTPCT 1.6 04/22/2024   RBC 4.59 04/22/2024   RBC 4.54 04/22/2024   Lab Results  Component Value Date   KPAFRELGTCHN 15.0 04/16/2024   LAMBDASER 9.0 04/16/2024   KAPLAMBRATIO 1.67 (H) 04/16/2024   Lab Results  Component Value Date   IGGSERUM 1,221 04/16/2024   IGMSERUM 136 04/16/2024   No results found for: STEPHANY CARLOTA BENSON MARKEL EARLA JOANNIE DOC VICK, SPEI   Chemistry      Component  Value Date/Time   NA 142 07/17/2023 0854   K 4.1 07/17/2023 0854   CL 109 07/17/2023 0854   CO2 27 07/17/2023 0854   BUN 11 07/17/2023 0854   CREATININE 0.81 07/17/2023 0854      Component Value Date/Time   CALCIUM 9.7 07/17/2023 0854   ALKPHOS 46 07/17/2023 0854   AST 12 (L) 07/17/2023 0854   ALT 7 07/17/2023 0854   BILITOT 1.1 07/17/2023 0854       Impression and Plan: Ms. Saksa is a pleasant 28 yo female with history of iron  deficiency without anemia.  Iron  studies look great and VWB panel was negative. No intervention needed at this time.  Follow-up in 6 months.   Lauraine Pepper, NP 11/26/20252:45 PM

## 2024-04-23 DIAGNOSIS — N809 Endometriosis, unspecified: Secondary | ICD-10-CM | POA: Diagnosis not present

## 2024-04-23 DIAGNOSIS — L732 Hidradenitis suppurativa: Secondary | ICD-10-CM | POA: Diagnosis not present

## 2024-04-23 DIAGNOSIS — M6289 Other specified disorders of muscle: Secondary | ICD-10-CM | POA: Diagnosis not present

## 2024-04-23 DIAGNOSIS — L680 Hirsutism: Secondary | ICD-10-CM | POA: Diagnosis not present

## 2024-04-23 LAB — VON WILLEBRAND PANEL
Coagulation Factor VIII: 129 % (ref 56–140)
Ristocetin Co-factor, Plasma: 160 % (ref 50–200)
Von Willebrand Antigen, Plasma: 125 % (ref 50–200)

## 2024-04-23 LAB — COAG STUDIES INTERP REPORT

## 2024-04-27 ENCOUNTER — Encounter: Payer: Self-pay | Admitting: Family

## 2024-04-27 DIAGNOSIS — H538 Other visual disturbances: Secondary | ICD-10-CM | POA: Diagnosis not present

## 2024-04-27 DIAGNOSIS — H5319 Other subjective visual disturbances: Secondary | ICD-10-CM | POA: Diagnosis not present

## 2024-04-28 DIAGNOSIS — R778 Other specified abnormalities of plasma proteins: Secondary | ICD-10-CM | POA: Diagnosis not present

## 2024-04-29 DIAGNOSIS — H52223 Regular astigmatism, bilateral: Secondary | ICD-10-CM | POA: Diagnosis not present

## 2024-04-29 DIAGNOSIS — H5051 Esophoria: Secondary | ICD-10-CM | POA: Diagnosis not present

## 2024-04-29 DIAGNOSIS — H5213 Myopia, bilateral: Secondary | ICD-10-CM | POA: Diagnosis not present

## 2024-04-30 DIAGNOSIS — R202 Paresthesia of skin: Secondary | ICD-10-CM | POA: Diagnosis not present

## 2024-04-30 DIAGNOSIS — M546 Pain in thoracic spine: Secondary | ICD-10-CM | POA: Diagnosis not present

## 2024-04-30 DIAGNOSIS — G64 Other disorders of peripheral nervous system: Secondary | ICD-10-CM | POA: Diagnosis not present

## 2024-05-01 ENCOUNTER — Other Ambulatory Visit (HOSPITAL_COMMUNITY): Payer: Self-pay

## 2024-05-02 ENCOUNTER — Other Ambulatory Visit (HOSPITAL_COMMUNITY): Payer: Self-pay

## 2024-05-04 ENCOUNTER — Encounter: Payer: Self-pay | Admitting: Family

## 2024-05-04 ENCOUNTER — Other Ambulatory Visit (HOSPITAL_COMMUNITY): Payer: Self-pay

## 2024-05-05 ENCOUNTER — Other Ambulatory Visit (HOSPITAL_COMMUNITY): Payer: Self-pay

## 2024-05-05 ENCOUNTER — Other Ambulatory Visit: Payer: Self-pay | Admitting: Family

## 2024-05-05 DIAGNOSIS — N3946 Mixed incontinence: Secondary | ICD-10-CM | POA: Diagnosis not present

## 2024-05-05 DIAGNOSIS — M6289 Other specified disorders of muscle: Secondary | ICD-10-CM | POA: Diagnosis not present

## 2024-05-05 DIAGNOSIS — F840 Autistic disorder: Secondary | ICD-10-CM | POA: Diagnosis not present

## 2024-05-05 DIAGNOSIS — F4312 Post-traumatic stress disorder, chronic: Secondary | ICD-10-CM | POA: Diagnosis not present

## 2024-05-05 DIAGNOSIS — R1023 Pelvic and perineal pain bilateral: Secondary | ICD-10-CM | POA: Diagnosis not present

## 2024-05-05 DIAGNOSIS — F429 Obsessive-compulsive disorder, unspecified: Secondary | ICD-10-CM | POA: Diagnosis not present

## 2024-05-05 DIAGNOSIS — M6281 Muscle weakness (generalized): Secondary | ICD-10-CM | POA: Diagnosis not present

## 2024-05-05 DIAGNOSIS — F3175 Bipolar disorder, in partial remission, most recent episode depressed: Secondary | ICD-10-CM | POA: Diagnosis not present

## 2024-05-05 DIAGNOSIS — M62838 Other muscle spasm: Secondary | ICD-10-CM | POA: Diagnosis not present

## 2024-05-05 DIAGNOSIS — D649 Anemia, unspecified: Secondary | ICD-10-CM

## 2024-05-05 DIAGNOSIS — F902 Attention-deficit hyperactivity disorder, combined type: Secondary | ICD-10-CM | POA: Diagnosis not present

## 2024-05-06 ENCOUNTER — Other Ambulatory Visit (HOSPITAL_COMMUNITY): Payer: Self-pay

## 2024-05-07 ENCOUNTER — Other Ambulatory Visit (HOSPITAL_COMMUNITY): Payer: Self-pay

## 2024-05-07 DIAGNOSIS — M9905 Segmental and somatic dysfunction of pelvic region: Secondary | ICD-10-CM | POA: Diagnosis not present

## 2024-05-07 DIAGNOSIS — R278 Other lack of coordination: Secondary | ICD-10-CM | POA: Diagnosis not present

## 2024-05-07 DIAGNOSIS — Z789 Other specified health status: Secondary | ICD-10-CM | POA: Diagnosis not present

## 2024-05-07 DIAGNOSIS — M62838 Other muscle spasm: Secondary | ICD-10-CM | POA: Diagnosis not present

## 2024-05-07 DIAGNOSIS — N39498 Other specified urinary incontinence: Secondary | ICD-10-CM | POA: Diagnosis not present

## 2024-05-07 DIAGNOSIS — K5902 Outlet dysfunction constipation: Secondary | ICD-10-CM | POA: Diagnosis not present

## 2024-05-07 DIAGNOSIS — R102 Pelvic and perineal pain unspecified side: Secondary | ICD-10-CM | POA: Diagnosis not present

## 2024-05-07 DIAGNOSIS — M7061 Trochanteric bursitis, right hip: Secondary | ICD-10-CM | POA: Diagnosis not present

## 2024-05-07 DIAGNOSIS — M545 Low back pain, unspecified: Secondary | ICD-10-CM | POA: Diagnosis not present

## 2024-05-07 DIAGNOSIS — M1611 Unilateral primary osteoarthritis, right hip: Secondary | ICD-10-CM | POA: Diagnosis not present

## 2024-05-07 DIAGNOSIS — M6281 Muscle weakness (generalized): Secondary | ICD-10-CM | POA: Diagnosis not present

## 2024-05-07 MED ORDER — CYCLOBENZAPRINE HCL 10 MG PO TABS
5.0000 mg | ORAL_TABLET | Freq: Three times a day (TID) | ORAL | 2 refills | Status: AC
  Filled 2024-05-07: qty 30, 20d supply, fill #0
  Filled 2024-05-21 – 2024-05-23 (×2): qty 30, 20d supply, fill #1
  Filled 2024-06-10 – 2024-06-23 (×3): qty 30, 20d supply, fill #2

## 2024-05-08 ENCOUNTER — Other Ambulatory Visit: Payer: Self-pay

## 2024-05-08 ENCOUNTER — Other Ambulatory Visit (HOSPITAL_COMMUNITY): Payer: Self-pay

## 2024-05-08 ENCOUNTER — Encounter: Payer: Self-pay | Admitting: Family

## 2024-05-11 ENCOUNTER — Ambulatory Visit: Admitting: Family Medicine

## 2024-05-11 ENCOUNTER — Encounter: Payer: Self-pay | Admitting: Family Medicine

## 2024-05-11 ENCOUNTER — Other Ambulatory Visit (HOSPITAL_COMMUNITY): Payer: Self-pay

## 2024-05-11 ENCOUNTER — Other Ambulatory Visit: Payer: Self-pay

## 2024-05-11 VITALS — BP 118/72 | HR 91 | Ht 68.75 in | Wt 170.0 lb

## 2024-05-11 DIAGNOSIS — M25851 Other specified joint disorders, right hip: Secondary | ICD-10-CM

## 2024-05-11 DIAGNOSIS — Q796 Ehlers-Danlos syndrome, unspecified: Secondary | ICD-10-CM

## 2024-05-11 DIAGNOSIS — A6920 Lyme disease, unspecified: Secondary | ICD-10-CM | POA: Diagnosis not present

## 2024-05-11 DIAGNOSIS — M3500 Sicca syndrome, unspecified: Secondary | ICD-10-CM | POA: Diagnosis not present

## 2024-05-11 DIAGNOSIS — R202 Paresthesia of skin: Secondary | ICD-10-CM | POA: Diagnosis not present

## 2024-05-11 DIAGNOSIS — Q765 Cervical rib: Secondary | ICD-10-CM | POA: Diagnosis not present

## 2024-05-11 DIAGNOSIS — G540 Brachial plexus disorders: Secondary | ICD-10-CM | POA: Diagnosis not present

## 2024-05-11 DIAGNOSIS — G44029 Chronic cluster headache, not intractable: Secondary | ICD-10-CM | POA: Diagnosis not present

## 2024-05-11 DIAGNOSIS — A1801 Tuberculosis of spine: Secondary | ICD-10-CM

## 2024-05-11 DIAGNOSIS — M791 Myalgia, unspecified site: Secondary | ICD-10-CM | POA: Diagnosis not present

## 2024-05-11 MED ORDER — PREGABALIN 50 MG PO CAPS
50.0000 mg | ORAL_CAPSULE | Freq: Three times a day (TID) | ORAL | 3 refills | Status: AC
  Filled 2024-05-11: qty 270, 90d supply, fill #0

## 2024-05-11 MED ORDER — EMGALITY 120 MG/ML ~~LOC~~ SOSY
120.0000 mg | PREFILLED_SYRINGE | SUBCUTANEOUS | 5 refills | Status: AC
  Filled 2024-05-11: qty 1, 30d supply, fill #0
  Filled 2024-05-12: qty 1, 28d supply, fill #0

## 2024-05-11 NOTE — Addendum Note (Signed)
 Addended by: JOANE ARTIST RAMAN on: 05/11/2024 12:19 PM   Modules accepted: Level of Service

## 2024-05-11 NOTE — Patient Instructions (Addendum)
 Thank you for coming in today.   [employer] Connective Tissue Disorders Panel Test code: [phone]  92 genes  Check out the book Disjointed Navigating the Diagnosis and Management of Hypermobile Ehlers-Danlos Syndrome and Hypermobility Spectrum Disorders.    For POTS symptoms: Consume 3 L water and 8-12 gram of salt per day   Check out these websites for exercises to try if you have POTS ([employer] Protocol, [place] Protocol, and [employer] Protocol) [email or web address]-[email or web address]/ https://dean.info/   Guide to Prof. Consuelo Patient Self-Care Handouts https://webspace.Http://www.black-smith.org/  Orthostatic Intolerance and Postural Orthostatic Tachycardia Self-Care Checklist https://webspace.Wvlyxc.com  Steps To Managing Your Mast Cell Activation Syndrome [email or web address].Newyearsms.fi.pdf   Let us  know if you would like for us  to place a referral to Physical Therapy.   Thank you for sharing your EDS resources with us  today.   See you back in 3 months.

## 2024-05-11 NOTE — Progress Notes (Signed)
° °  I, Leotis Batter, CMA acting as a scribe for Artist Lloyd, MD.  Andrea LABOR Hausen is a 28 y.o. female who presents to [facility] at [facility] today for evaluation of her hypermobility. Has been formerly diagnosed with hEDS by [facility].   MS: Chronic joint pain, Chronic wide spread muscle pain, Joint Hypermobility, Joint Instability, Joint Subluxation, Abnormal spinal curvature, and Slipping Ribs / Costochondritis , chronic neck, back, hip pain Skin/Immune reactions: Hyperextensible/ stretchy Skin, Fragile Skin that tears easily, Soft Velvety Skin, Poor Wound Healing, Easy Bruising / Bleeding, Atrophic Scars, Abnormal Stretch Marks Before Puberty / without significant weight gain , Rashes / Hives, and Medication / Chemical / Food Sensitivities  Neurological: Headache  Migraine, Syncope / Pre-Syncope, Reduced Proprioception, Clumsiness , Burning, Numbness and/or Tingling in Extremities, Cognitive Dysfunction / Brain Fog, Sleep Disturbance, and Sensory Sensitivities ANS: Dysautonomia / POTS / Orthostatic Intolerance, Syncope / Pre-Syncope / Dizziness, Fatigue, Raynaud's , Exercise / Temperature Intolerance , and Anxiety / Panic Respiratory: Dyspnea, Chest Tightness, and Vocal Chord Dysfunction, esophageal dysphagia,  CV: Tachycardia GI:  GERD, IBS with Diarrhea or Constipation, Gastroparesis, Painful ABD Bloating, and Frequent N/V Genitourinary: Pelvic Pain / Fullness / Pressure, Incontinence, Recurrent UTI / Cystitis, Painful Intercourse, and Endometriosis Hands & Feet: Long Slender Fingers / Toes, Piezogenic Papules, Flat Feet , and Raynaud's Phenomenon   Treatments tried: OMT @ [facility],   Pertinent review of systems: No fevers or chills  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   Exam:  BP 118/72   Pulse 91   Ht 5' 8.75 (1.746 m)   Wt 170 lb (77.1 kg)   LMP 04/08/2024   SpO2 99%   BMI 25.29 kg/m  General: Well Developed, well nourished,  and in no acute distress.   MSK: Hypermobile    Lab and Radiology Results No results found for this or any previous visit (from the past 72 hours). No results found.     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  Subtype is a bit uncertain.  She fortunately has done a great job of self-care and has done physical therapy strengthening exercises as well as treatment for POTS and mast cell activation syndrome.  She is currently arranging for occupational therapy referral which I do think would be helpful.  If needed a retrial of physical therapy would be helpful.  She lives in [place] so finding a good PT location for her may be a bit challenging.  We did discuss genetic testing and we will proceed with [facility] connective tissue disorder panel which I think will be helpful.  I am not quite certain of her subtype.     PDMP not reviewed this encounter. No orders of the defined types were placed in this encounter.  No orders of the defined types were placed in this encounter.    Discussed warning signs or symptoms. Please see discharge instructions. Patient expresses understanding.   The above documentation has been reviewed and is accurate and complete Artist Lloyd, M.D.

## 2024-05-12 ENCOUNTER — Other Ambulatory Visit (HOSPITAL_COMMUNITY): Payer: Self-pay

## 2024-05-12 DIAGNOSIS — Z862 Personal history of diseases of the blood and blood-forming organs and certain disorders involving the immune mechanism: Secondary | ICD-10-CM | POA: Diagnosis not present

## 2024-05-13 ENCOUNTER — Other Ambulatory Visit: Payer: Self-pay

## 2024-05-13 ENCOUNTER — Other Ambulatory Visit (HOSPITAL_COMMUNITY): Payer: Self-pay

## 2024-05-13 DIAGNOSIS — F319 Bipolar disorder, unspecified: Secondary | ICD-10-CM | POA: Diagnosis not present

## 2024-05-13 DIAGNOSIS — F4312 Post-traumatic stress disorder, chronic: Secondary | ICD-10-CM | POA: Diagnosis not present

## 2024-05-13 DIAGNOSIS — F429 Obsessive-compulsive disorder, unspecified: Secondary | ICD-10-CM | POA: Diagnosis not present

## 2024-05-13 DIAGNOSIS — F902 Attention-deficit hyperactivity disorder, combined type: Secondary | ICD-10-CM | POA: Diagnosis not present

## 2024-05-13 DIAGNOSIS — F4310 Post-traumatic stress disorder, unspecified: Secondary | ICD-10-CM | POA: Diagnosis not present

## 2024-05-13 DIAGNOSIS — F840 Autistic disorder: Secondary | ICD-10-CM | POA: Diagnosis not present

## 2024-05-13 DIAGNOSIS — F3175 Bipolar disorder, in partial remission, most recent episode depressed: Secondary | ICD-10-CM | POA: Diagnosis not present

## 2024-05-13 MED ORDER — LISDEXAMFETAMINE DIMESYLATE 30 MG PO CAPS: 30.0000 mg | ORAL_CAPSULE | Freq: Two times a day (BID) | ORAL | 0 refills | Status: AC

## 2024-05-13 MED ORDER — BUPROPION HCL ER (XL) 300 MG PO TB24
300.0000 mg | ORAL_TABLET | Freq: Every day | ORAL | 1 refills | Status: AC
  Filled 2024-05-13 – 2024-05-14 (×2): qty 90, 90d supply, fill #0

## 2024-05-13 MED ORDER — CAPLYTA 42 MG PO CAPS
42.0000 mg | ORAL_CAPSULE | Freq: Every day | ORAL | 1 refills | Status: AC
  Filled 2024-05-13: qty 90, 90d supply, fill #0

## 2024-05-14 ENCOUNTER — Other Ambulatory Visit (HOSPITAL_COMMUNITY): Payer: Self-pay

## 2024-05-15 ENCOUNTER — Other Ambulatory Visit (HOSPITAL_COMMUNITY): Payer: Self-pay

## 2024-05-15 NOTE — Progress Notes (Signed)
 S: Pt here today for f/u of R side mid back/flank pain See prev note for more details.  In summary: Multiple chronic, unstable issues for which she is seeing multiple specialists Our main focus is her Msk pain R side mid back/flank pain currently ddx including intracostal neuropathy, chronic paraspinal muscle spasm She is awaiting GI w/u then will follow up with pain management  Today: Neuro: recently diagnosed with small fiber neuropathy, repeat lyme disease test negative; rx lyrica  but she hasn't started this yet. R hip arthroscopy: remotely done 2023, she has started muscle relaxant at night, she has referral for OT for ergonomics with household chores and driving. 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 She noticed these symptoms after exertion. Hoping PT can focus on dry needling and manual therapy. Continuing with acupuncture.  She has a virtual PCP appt next week. She was supposed to have one 1/8.  R sided upper back and upper chest wall tension She had a iliopsoas and trochanteric bursa injections earlier this week which has somewhat helped No chest pain or palpitations No shortness of breath   O: BP 110/67   Pulse 88   Ht 1.753 m (5' 9)   Wt 76.2 kg (168 lb)   BMI 24.81 kg/m  Gen: nad Skin: no ecchymosis Neuro: gen sensation intact CV: good distal perfusion MSK:  Ropiness along R side paraspinal thoracic muscles and lower trap/oblique Mood: appropriate affect    A/P: Spent 30 min with patient in discussion, chart review, evaluation, plan of care and documentation  Chronic, uncontrolled R side pain -see HPI -cont with acupuncture, PT/OT -OMT today   Reviewed other chronic issues as above, pt is receiving managemnet from other providers  OMT  Procedure Note:  Cranial: Suboccipital Hypertonicity--OA Decompression Cervical: Paraspinal muscle spasm--MR/FPR, C3-6L--MET Thoracic: Paraspinal muscle spasm--MR/FPR, segment RR--MET, HVLA Lumbar: Paraspinal muscle spasm--MR/FPR Upper Extremity: R Trapezius muscle spasm--MR/FPR Lower Extremity: L glute spasm-SCS Pelvis: SFT + on R, R ant innominate--mET Sacrum: SeFT + on L, L on R torsion--MET Rib: R QL spasm--FPR  Pt tolerated procedure well with improvement in symptoms.  MR--Myofascial Release, FPR--Facilitated Positional Release, SCS--Strain Counterstrain, MET--Muscle Energy Technique, HVLA--high velocity low amplitude, SFT--standing flexion test, SeFT--seated flexion test, AT--articulatory technique, BLT--balanced ligamentous technique

## 2024-05-16 ENCOUNTER — Encounter (HOSPITAL_COMMUNITY): Payer: Self-pay | Admitting: Pharmacist

## 2024-05-16 ENCOUNTER — Other Ambulatory Visit (HOSPITAL_COMMUNITY): Payer: Self-pay

## 2024-05-18 ENCOUNTER — Encounter: Payer: Self-pay | Admitting: Family

## 2024-05-18 ENCOUNTER — Other Ambulatory Visit (HOSPITAL_COMMUNITY): Payer: Self-pay

## 2024-05-19 ENCOUNTER — Other Ambulatory Visit (HOSPITAL_COMMUNITY): Payer: Self-pay

## 2024-05-19 MED ORDER — EPINEPHRINE 0.3 MG/0.3ML IJ SOAJ
INTRAMUSCULAR | 0 refills | Status: AC
  Filled 2024-05-19: qty 2, 2d supply, fill #0

## 2024-05-22 ENCOUNTER — Other Ambulatory Visit (HOSPITAL_COMMUNITY): Payer: Self-pay

## 2024-05-22 DIAGNOSIS — G540 Brachial plexus disorders: Secondary | ICD-10-CM | POA: Diagnosis not present

## 2024-05-23 ENCOUNTER — Other Ambulatory Visit (HOSPITAL_COMMUNITY): Payer: Self-pay

## 2024-05-25 ENCOUNTER — Other Ambulatory Visit (HOSPITAL_COMMUNITY): Payer: Self-pay

## 2024-05-25 MED ORDER — CROMOLYN SODIUM 100 MG/5ML PO CONC
200.0000 mg | Freq: Three times a day (TID) | ORAL | 0 refills | Status: AC
  Filled 2024-05-25: qty 3840, 90d supply, fill #0

## 2024-05-26 ENCOUNTER — Other Ambulatory Visit (HOSPITAL_COMMUNITY): Payer: Self-pay

## 2024-05-29 ENCOUNTER — Encounter: Payer: Self-pay | Admitting: Family

## 2024-05-29 ENCOUNTER — Telehealth: Payer: Self-pay

## 2024-05-29 NOTE — Telephone Encounter (Signed)
 Report placed on Dr. Virgilio desk to review.

## 2024-06-02 ENCOUNTER — Other Ambulatory Visit (HOSPITAL_COMMUNITY): Payer: Self-pay

## 2024-06-02 NOTE — Telephone Encounter (Signed)
 Genetic testing is negative.  This is consistent with hypermobile EDS.

## 2024-06-03 ENCOUNTER — Encounter: Payer: Self-pay | Admitting: Family

## 2024-06-03 ENCOUNTER — Other Ambulatory Visit (HOSPITAL_COMMUNITY): Payer: Self-pay

## 2024-06-05 ENCOUNTER — Other Ambulatory Visit (HOSPITAL_COMMUNITY): Payer: Self-pay

## 2024-06-08 ENCOUNTER — Encounter: Payer: Self-pay | Admitting: Family

## 2024-06-08 ENCOUNTER — Other Ambulatory Visit (HOSPITAL_COMMUNITY): Payer: Self-pay

## 2024-06-09 NOTE — Telephone Encounter (Signed)
 Amanda Gill 05/26/2024 3:44 PM  Dear Client, [facility] is currently working on [employer] prior authorization for our mutual patient. In order to proceed, the health plan requires clinical notes to verify coverage criteria has been met for the ordered genetic testing. The clinical notes will be submitted to the health plan along with the authorization request for genetic testing. Please upload the clinical notes via our provider portal, or fax them to [phone], Attention: Cassandra/Medical Records. If you have any questions, feel free to enter a message via our provider portal, email [email or web address] or contact me directly at [phone]. We appreciate your timely assistance.   Amanda Gill 06/09/2024 10:40 AM  Good Morning. I am following up to see if you received the requested medical records? I'd also like to confirm if we selected ship to patient for this order. Thank you, Amanda, CMA

## 2024-06-10 ENCOUNTER — Other Ambulatory Visit: Payer: Self-pay

## 2024-06-10 ENCOUNTER — Other Ambulatory Visit (HOSPITAL_COMMUNITY): Payer: Self-pay

## 2024-06-10 MED ORDER — ACARBOSE 25 MG PO TABS
ORAL_TABLET | ORAL | 3 refills | Status: AC
  Filled 2024-06-10: qty 90, 15d supply, fill #0
  Filled 2024-06-19: qty 90, 15d supply, fill #1

## 2024-06-10 NOTE — Telephone Encounter (Signed)
 Amanda Gill 06/09/2024 1:54 PM Hello Amanda Gill. We have received the medical records that were faxed over. We appreciate you following up to confirm that we received the documentation. I have forwarded your request for shipping to patient to our billing team. They will follow up with you. Have a great day!  Cynthie Hui 06/09/2024 2:05 PM Hi Amanda Gill, Thank you for checking in. Yes, the kit was requested along with this portal order and automatically shipped to this patient. -Cynthie, [employer] [facility]

## 2024-06-11 ENCOUNTER — Other Ambulatory Visit (HOSPITAL_COMMUNITY): Payer: Self-pay

## 2024-06-11 ENCOUNTER — Encounter: Payer: Self-pay | Admitting: Family

## 2024-06-15 NOTE — Telephone Encounter (Signed)
 Report printed and placed on Dr Virgilio desk to review.

## 2024-06-22 ENCOUNTER — Other Ambulatory Visit (HOSPITAL_COMMUNITY): Payer: Self-pay

## 2024-06-22 MED ORDER — NYSTATIN 100000 UNIT/ML MT SUSP
5.0000 mL | Freq: Four times a day (QID) | OROMUCOSAL | 0 refills | Status: AC
  Filled 2024-06-22: qty 200, 10d supply, fill #0

## 2024-06-23 ENCOUNTER — Other Ambulatory Visit (HOSPITAL_COMMUNITY): Payer: Self-pay

## 2024-06-26 ENCOUNTER — Other Ambulatory Visit (HOSPITAL_COMMUNITY): Payer: Self-pay

## 2024-06-26 MED ORDER — LARIN 24 FE 1-20 MG-MCG(24) PO TABS
1.0000 | ORAL_TABLET | Freq: Every day | ORAL | 3 refills | Status: AC
  Filled 2024-06-26: qty 112, 84d supply, fill #0

## 2024-06-30 ENCOUNTER — Other Ambulatory Visit: Payer: Self-pay

## 2024-06-30 ENCOUNTER — Other Ambulatory Visit (HOSPITAL_COMMUNITY): Payer: Self-pay

## 2024-07-01 ENCOUNTER — Other Ambulatory Visit (HOSPITAL_COMMUNITY): Payer: Self-pay

## 2024-07-20 ENCOUNTER — Ambulatory Visit

## 2024-08-03 ENCOUNTER — Ambulatory Visit: Admitting: Family Medicine

## 2024-08-10 ENCOUNTER — Ambulatory Visit: Admitting: Family Medicine

## 2024-08-31 ENCOUNTER — Ambulatory Visit: Admitting: "Endocrinology

## 2024-10-21 ENCOUNTER — Inpatient Hospital Stay: Admitting: Family

## 2024-10-21 ENCOUNTER — Inpatient Hospital Stay

## 2024-11-24 ENCOUNTER — Ambulatory Visit: Admitting: Dermatology
